# Patient Record
Sex: Female | Born: 1935 | Race: White | Hispanic: No | Smoking: Former smoker
Health system: Southern US, Community
[De-identification: ages and names within clinical notes are randomized; demographics above are authoritative.]

## PROBLEM LIST (undated history)

## (undated) DIAGNOSIS — E871 Hypo-osmolality and hyponatremia: Secondary | ICD-10-CM

## (undated) DIAGNOSIS — N183 Chronic kidney disease, stage 3 unspecified: Secondary | ICD-10-CM

## (undated) DIAGNOSIS — E785 Hyperlipidemia, unspecified: Secondary | ICD-10-CM

## (undated) DIAGNOSIS — I701 Atherosclerosis of renal artery: Secondary | ICD-10-CM

## (undated) DIAGNOSIS — I714 Abdominal aortic aneurysm, without rupture, unspecified: Secondary | ICD-10-CM

## (undated) DIAGNOSIS — H9190 Unspecified hearing loss, unspecified ear: Secondary | ICD-10-CM

## (undated) DIAGNOSIS — I10 Essential (primary) hypertension: Secondary | ICD-10-CM

## (undated) DIAGNOSIS — I739 Peripheral vascular disease, unspecified: Secondary | ICD-10-CM

## (undated) HISTORY — DX: Abdominal aortic aneurysm, without rupture, unspecified: I71.40

## (undated) HISTORY — DX: Chronic kidney disease, stage 3 (moderate): N18.3

## (undated) HISTORY — DX: Essential (primary) hypertension: I10

## (undated) HISTORY — DX: Hyperlipidemia, unspecified: E78.5

## (undated) HISTORY — DX: Peripheral vascular disease, unspecified: I73.9

## (undated) HISTORY — DX: Chronic kidney disease, stage 3 unspecified: N18.30

## (undated) HISTORY — DX: Abdominal aortic aneurysm, without rupture: I71.4

## (undated) HISTORY — DX: Atherosclerosis of renal artery: I70.1

---

## 2000-10-26 ENCOUNTER — Ambulatory Visit (HOSPITAL_COMMUNITY): Admission: RE | Admit: 2000-10-26 | Discharge: 2000-10-26 | Payer: Self-pay | Admitting: Internal Medicine

## 2000-10-26 ENCOUNTER — Emergency Department (HOSPITAL_COMMUNITY): Admission: EM | Admit: 2000-10-26 | Discharge: 2000-10-26 | Payer: Self-pay | Admitting: Emergency Medicine

## 2002-10-16 ENCOUNTER — Ambulatory Visit (HOSPITAL_COMMUNITY): Admission: RE | Admit: 2002-10-16 | Discharge: 2002-10-16 | Payer: Self-pay | Admitting: Internal Medicine

## 2005-08-30 ENCOUNTER — Emergency Department (HOSPITAL_COMMUNITY): Admission: EM | Admit: 2005-08-30 | Discharge: 2005-08-30 | Payer: Self-pay | Admitting: Emergency Medicine

## 2005-09-02 ENCOUNTER — Emergency Department (HOSPITAL_COMMUNITY): Admission: EM | Admit: 2005-09-02 | Discharge: 2005-09-02 | Payer: Self-pay | Admitting: Family Medicine

## 2005-09-09 ENCOUNTER — Emergency Department (HOSPITAL_COMMUNITY): Admission: EM | Admit: 2005-09-09 | Discharge: 2005-09-09 | Payer: Self-pay | Admitting: Family Medicine

## 2005-12-23 ENCOUNTER — Emergency Department (HOSPITAL_COMMUNITY): Admission: EM | Admit: 2005-12-23 | Discharge: 2005-12-23 | Payer: Self-pay | Admitting: Family Medicine

## 2006-01-27 ENCOUNTER — Emergency Department (HOSPITAL_COMMUNITY): Admission: EM | Admit: 2006-01-27 | Discharge: 2006-01-27 | Payer: Self-pay | Admitting: Family Medicine

## 2006-02-23 ENCOUNTER — Encounter: Admission: RE | Admit: 2006-02-23 | Discharge: 2006-02-23 | Payer: Self-pay | Admitting: Internal Medicine

## 2006-03-29 ENCOUNTER — Encounter: Admission: RE | Admit: 2006-03-29 | Discharge: 2006-03-29 | Payer: Self-pay | Admitting: Internal Medicine

## 2006-06-24 ENCOUNTER — Emergency Department (HOSPITAL_COMMUNITY): Admission: EM | Admit: 2006-06-24 | Discharge: 2006-06-24 | Payer: Self-pay | Admitting: Family Medicine

## 2007-11-14 HISTORY — PX: TRANSTHORACIC ECHOCARDIOGRAM: SHX275

## 2008-02-18 ENCOUNTER — Emergency Department (HOSPITAL_COMMUNITY): Admission: EM | Admit: 2008-02-18 | Discharge: 2008-02-18 | Payer: Self-pay | Admitting: Emergency Medicine

## 2008-06-25 HISTORY — PX: NM MYOVIEW LTD: HXRAD82

## 2009-05-24 ENCOUNTER — Emergency Department (HOSPITAL_COMMUNITY): Admission: EM | Admit: 2009-05-24 | Discharge: 2009-05-24 | Payer: Self-pay | Admitting: Family Medicine

## 2009-07-12 ENCOUNTER — Inpatient Hospital Stay (HOSPITAL_COMMUNITY): Admission: EM | Admit: 2009-07-12 | Discharge: 2009-07-14 | Payer: Self-pay | Admitting: Cardiology

## 2009-07-13 HISTORY — PX: OTHER SURGICAL HISTORY: SHX169

## 2010-11-02 LAB — BASIC METABOLIC PANEL
CO2: 23 mEq/L (ref 19–32)
Calcium: 9.1 mg/dL (ref 8.4–10.5)
Chloride: 108 mEq/L (ref 96–112)
GFR calc Af Amer: 34 mL/min — ABNORMAL LOW (ref >60)
GFR calc Af Amer: 36 mL/min — ABNORMAL LOW (ref >60)
GFR calc Af Amer: 38 mL/min — ABNORMAL LOW (ref >60)
GFR calc non Af Amer: 30 mL/min — ABNORMAL LOW (ref >60)
GFR calc non Af Amer: 31 mL/min — ABNORMAL LOW (ref >60)
Glucose, Bld: 103 mg/dL — ABNORMAL HIGH (ref 70–99)
Glucose, Bld: 92 mg/dL (ref 70–99)
Potassium: 4.4 mEq/L (ref 3.5–5.1)
Potassium: 4.5 mEq/L (ref 3.5–5.1)
Potassium: 5 mEq/L (ref 3.5–5.1)
Sodium: 138 mEq/L (ref 135–145)
Sodium: 139 mEq/L (ref 135–145)
Sodium: 141 mEq/L (ref 135–145)

## 2010-11-02 LAB — CBC
HCT: 33.7 % — ABNORMAL LOW (ref 36.0–46.0)
Hemoglobin: 11.3 g/dL — ABNORMAL LOW (ref 12.0–15.0)
MCHC: 33.5 g/dL (ref 30.0–36.0)
MCHC: 33.6 g/dL (ref 30.0–36.0)
MCV: 96.3 fL (ref 78.0–100.0)
Platelets: 452 10*3/uL — ABNORMAL HIGH (ref 150–400)
RDW: 14 % (ref 11.5–15.5)
WBC: 8.7 10*3/uL (ref 4.0–10.5)

## 2010-11-02 LAB — APTT: aPTT: 29 seconds (ref 24–37)

## 2010-11-02 LAB — TSH: TSH: 0.773 u[IU]/mL (ref 0.350–4.500)

## 2010-11-02 LAB — PROTIME-INR: INR: 0.96 (ref 0.00–1.49)

## 2010-12-14 NOTE — Procedures (Signed)
NAMECHANTELLA, [name]              ACCOUNT NO.:  [id number]   MEDICAL RECORD NO.:  [id number]          PATIENT TYPE:  INP   LOCATION:  2006                         FACILITY:  [facility]   PHYSICIAN:  Nanetta Batty, M.D.   DATE OF BIRTH:  08-29-1925   DATE OF PROCEDURE:  DATE OF DISCHARGE:                    PERIPHERAL VASCULAR INVASIVE PROCEDURE   Kristina [name] is an 77 year old frail-appearing Caucasian female with a  history of hypertension, moderate renal dysfunction with an atrophic  nonfunctioning left kidney and a creatinine in the 1.6-1.7 range and  claudication.  She has by duplex ultrasound, short segment occlusion of  her right SFA with significant disease at the origin of her left common  iliac.  She was admitted 24 hours ago for hydration and withholding her  ACE inhibitor and diuretic.  She presents now for abdominal aortography  with runoff using CO2 and limited contrast.   PROCEDURE DESCRIPTION:  The patient was brought to the second floor  [facility] in a postabsorptive state.  She was  premedicated with p.o. Valium.  She received Mucomyst.  Both groins were  prepped and shaved in the usual sterile fashion.  Xylocaine 1% was used  for local anesthesia.  A 6-French sheath was inserted into the left  femoral artery using the standard Seldinger technique.  A 6-French  pigtail catheter was used for abdominal aortography using CO2.  Distal  abdominal aortography was performed using contrast.  The contralateral  access was obtained with a crossover catheter, a 0.35 Wholey wire and  end-hole catheter.  A right lower extremity runoff was performed using  digital subtraction bolus-chase step-table technique.  A total of 50 or  55 mL of contrast was used for the entirety of the case.  Pullback  gradients were performed across the right external iliac and common  iliac junction, as well as the origin of the left common iliac artery  after administration of 200 mcg  of intraarterial nitroglycerin.  Visipaque dye was used for the entirety of the case, except for CO2  initially.  Retrograde aortic pressure was monitored during the case.   ANGIOGRAPHIC RESULTS:  1. Abdominal aorta:      a.     Renal arteries- patent right renal artery by CO2       angiography.      b.     Infrarenal abdominal aorta: Saccular calcified infrarenal       abdominal aorta which was aneurysmal and measured 2.7 cm by duplex       ultrasound.  2. Left lower extremity:      a.     Left common iliac arteries- 50% to 60% ostial/proximal left       common iliac artery stenosis.  There was some fluoroscopic       calcification.  There was approximately a 25-30 mm gradient noted       with pullback using a 5-French catheter and after administration       of 200 mcg of intraarterial nitroglycerin.      b.     The proximal segment of the left SFA was visualized  revealing high-grade segmental SFA disease.  3. Right lower extremity:      a.     A 50% to 60% right external iliac artery stenosis at its       origin with a 20 mm pullback gradient.      b.     A 70% to 80% diffuse segmental proximal mid right SFA       disease which was moderately calcified.  There was a short segment       occlusion just above Hunter's canal through constitution in the       above-the-knee popliteal and two-vessel runoff.   IMPRESSION:  Kristina [name] has diffuse superficial femoral artery disease  with right external iliac and left common iliac artery disease.  Given  her renal insufficiency and decreased creatinine clearance, and given  the fact that she had 50 mL of contrast used for the diagnostics of the  case, we will plan on doing staged intervention at some time in the near  future.   The sheath was removed.  Manual pressure was held to the groin to  achieve hemostasis.  The patient left the lab in stable condition.  She  will be gently hydrated overnight and her renal function will be   assessed in the morning.  She will be discharged home once she remains  clinically stable for planned staged intervention.      Nanetta Batty, M.D.  Electronically Signed     JB/MEDQ  D:  07/13/2009  T:  07/13/2009  Job:  [id number]   cc:   [facility]  [facility]  Ritta Slot, MD  Ace Gins, MD

## 2011-05-30 HISTORY — PX: OTHER SURGICAL HISTORY: SHX169

## 2011-09-01 ENCOUNTER — Other Ambulatory Visit: Payer: Self-pay | Admitting: Gastroenterology

## 2011-09-01 DIAGNOSIS — R109 Unspecified abdominal pain: Secondary | ICD-10-CM

## 2011-09-02 ENCOUNTER — Other Ambulatory Visit: Payer: Self-pay

## 2012-10-04 ENCOUNTER — Other Ambulatory Visit (HOSPITAL_COMMUNITY): Payer: Self-pay | Admitting: Cardiology

## 2012-10-04 DIAGNOSIS — I714 Abdominal aortic aneurysm, without rupture: Secondary | ICD-10-CM

## 2012-10-29 ENCOUNTER — Encounter (HOSPITAL_COMMUNITY): Payer: Self-pay

## 2013-07-10 ENCOUNTER — Encounter: Payer: Self-pay | Admitting: Cardiology

## 2013-07-11 ENCOUNTER — Encounter: Payer: Self-pay | Admitting: Cardiology

## 2013-07-11 ENCOUNTER — Telehealth: Payer: Self-pay | Admitting: Cardiology

## 2013-07-11 ENCOUNTER — Ambulatory Visit (INDEPENDENT_AMBULATORY_CARE_PROVIDER_SITE_OTHER): Payer: Medicare Other | Admitting: Cardiology

## 2013-07-11 VITALS — BP 174/94 | HR 80 | Ht 59.0 in | Wt 97.3 lb

## 2013-07-11 DIAGNOSIS — I70219 Atherosclerosis of native arteries of extremities with intermittent claudication, unspecified extremity: Secondary | ICD-10-CM

## 2013-07-11 DIAGNOSIS — E785 Hyperlipidemia, unspecified: Secondary | ICD-10-CM

## 2013-07-11 DIAGNOSIS — I10 Essential (primary) hypertension: Secondary | ICD-10-CM

## 2013-07-11 DIAGNOSIS — I714 Abdominal aortic aneurysm, without rupture, unspecified: Secondary | ICD-10-CM | POA: Insufficient documentation

## 2013-07-11 DIAGNOSIS — N183 Chronic kidney disease, stage 3 (moderate): Secondary | ICD-10-CM

## 2013-07-11 MED ORDER — HYDRALAZINE HCL 50 MG PO TABS: 50.0000 mg | ORAL_TABLET | Freq: Three times a day (TID) | ORAL | Status: DC

## 2013-07-11 MED ORDER — HYDRALAZINE HCL 50 MG PO TABS: 50.0000 mg | ORAL_TABLET | Freq: Two times a day (BID) | ORAL | Status: DC

## 2013-07-11 MED ORDER — SIMVASTATIN 10 MG PO TABS: 10.0000 mg | ORAL_TABLET | Freq: Every day | ORAL | Status: DC

## 2013-07-11 NOTE — Patient Instructions (Signed)
Increase hydralazine to 50 mg. Take one tablet twice a day  Restart simvastatin 10 mg one tablet at bedtime.  Your physician wants you to follow-up in 6 month Dr Herbie Baltimore.  You will receive a reminder letter in the mail two months in advance. If you don't receive a letter, please call our office to schedule the follow-up appointment.

## 2013-07-12 NOTE — Telephone Encounter (Signed)
Spoke to  [employer] about patient medication simavastatin 10 mg qhs  Place on hold  Hydralazine 50 mg twice a day.  (patient has 25 mg tablet at present time)

## 2013-07-14 ENCOUNTER — Encounter: Payer: Self-pay | Admitting: Cardiology

## 2013-07-14 NOTE — Assessment & Plan Note (Signed)
Her chronic kidney disease is one potential reason to try to avoid invasive procedures if at all possible. She has voiced an opinion if she would at this particular time not be interested in dialysis if it came to it.

## 2013-07-14 NOTE — Assessment & Plan Note (Signed)
Very poorly controlled today. I will increase her hydralazine from 25 to 50 mg twice a day. Dr. Hyman Hopes, her nephrologist to stop the ACE inhibitor and HCTZ. I'll defer further adjustments of her antihypertensive regimen to her nephrologist.

## 2013-07-14 NOTE — Progress Notes (Signed)
PATIENT: Kristina [name] MRN: [id number]  DOB: 1926/05/06   DOV:07/14/2013 PCP: Thayer Headings, MD  Clinic Note: Chief Complaint  Patient presents with  . Annual Exam    chest pain , no sob , edema legs, no dopllers this year, Dr Hyman Hopes d/c linsinopril-hct   HPI: Kristina [name] is a 79 y.o.  female with a PMH below who presents today for annual followup of her mostly PAD. She has multiple cardiac risk factors but has had a relatively normal cardiac evaluation to date with a Myoview in 2009 was negative a relatively normal echocardiogram as well that time. She has not had any anginal symptoms or heart failure symptoms to suspect any coronary disease. She does have relatively significant lower extremity arterial disease with claudication. She had peripheral angiography performed by Dr. Allyson Sabal in 2009, but no intervention was done at that time. She has not wanted to go back under the care of Dr. Allyson Sabal unfortunately since.  Interval History: She presents as a relatively at her baseline level. She still gets claudication on walking around in his mouth. Basically in a driveway and she is claudication in her calves. She also gets at nighttime for leg aching and it actually gets better when she gets up and moves around  a bit.  She continues to deny any cardiac symptoms such as angina type chest pain or dyspnea at rest or exertion. No PND, orthopnea or edema. No palpitations or rapid heartbeat/irregular heartbeats. No lightheadedness, dizziness, wooziness or syncope/near-syncope. No TIA or amaurosis fugax symptoms. No melena, hematochezia or hematuria. No nosebleeds.  Past Medical History  Diagnosis Date  . PAD (peripheral artery disease)     LEA Dopplers: Right external iliac 40%, right mid SFA 70-99% with occlusion in Hunter's canal. Angiography. LEFT common iliac 60%, left SFA 70-9%, popliteal 50%; RIGHT posterior tibial occluded;; carotid Dopplers in 2009: Less than 50% stenosis.  . Hypertension   .  Renal artery stenosis   . Chronic kidney disease (CKD), stage III (moderate)     1 functional kidney due to renal artery occlusion on the right  . AAA (abdominal aortic aneurysm)     Mild - measuring 2.8x2.8cm  . Hyperlipidemia     Well-controlled    Prior Cardiac Evaluation and Past Surgical History: Past Surgical History  Procedure Laterality Date  . Peripheral vascular angiogram  07/13/2009    No intervention - "plan on doing staged intervention in near future"  . Lower extremity arterial doppler  05/30/2011    Right EIA->50% daimeter reduction, Rt SFA 70-99% diameter reduction, Rt SFA-occlusive disease at Hunter's canal, Rt PTA-appeared occluded, Lft CIA-moderate amount of calcific plaque w/ >60% diameter reduction, Lft prox SFA 70-99% diameter reduction, Lft POP-large amount of irregular mixed suggesting <50% diameter reduction  . Nm myoview ltd  06/25/2008    No scintigraphic evidence of inducible myocardial ischemia  . Transthoracic echocardiogram  11/14/2007    EF >55%, mild mitral and tricuspid regurg.    Allergies  Allergen Reactions  . Lipitor [Atorvastatin]   . Pletal [Cilostazol]     Current Outpatient Prescriptions  Medication Sig Dispense Refill  . aspirin 325 MG tablet Take 325 mg by mouth daily.      Marland Kitchen atorvastatin (LIPITOR) 20 MG tablet Take 20 mg by mouth daily.      . Cholecalciferol (VITAMIN D-3 PO) Take by mouth.      Marland Kitchen HYDROcodone-acetaminophen (NORCO/VICODIN) 5-325 MG per tablet Take 1 tablet by mouth every 6 (six) hours as  needed for moderate pain.      . Magnesium Cl-Calcium Carbonate (SLOW-MAG PO) Take by mouth.      . Polyethylene Glycol 3350 (MIRALAX PO) Take by mouth.      . Probiotic Product (ALIGN) 4 MG CAPS Take by mouth daily.      . hydrALAZINE (APRESOLINE) 50 MG tablet Take 1 tablet (50 mg total) by mouth 2 (two) times daily.  60 tablet  11  . simvastatin (ZOCOR) 10 MG tablet Take 1 tablet (10 mg total) by mouth at bedtime.  30 tablet  11    No current facility-administered medications for this visit.    History   Social History Narrative   Single mother of 2, grandmother 1. Tries to exercise when she can, but is limited by claudication.   Former smoker who quit in the early 2000s.   Drinks occasional glass of wine    ROS: A comprehensive Review of Systems - Negative if not noted in history of present illness.  PHYSICAL EXAM BP 174/94  Pulse 80  Ht 4\' 11"  (1.499 m)  Wt 97 lb 4.8 oz (44.135 kg)  BMI 19.64 kg/m2 General: She is a very pleasant, healthy appearing elderly woman. Appears younger than her stated age. She is in no acute distress, alert and oriented x3, answers questions appropriately. She is well groomed. She does have the appearance of a chronic smoker with the tanned, leathery skin and hoarse voice. She has a normal gait and walks without difficulty.  HEENT: NCAT, EOMI, MMM. Anicteric sclerae. Neck: Supple, no LAN or JVD. Soft right carotid bruit  Heart: RRR, normal S1, S2. Soft HSM heard at the apex, 1/6. Nondisplaced PMI. No other M./R./G.,  Lungs: CTAB with mild interstitial sounds bilaterally, but otherwise CTAB and nonlabored, no wheezes, rales or rhonchi.  Abdomen: Soft/NT/ND/NABS. No HSM. She does have a pulsatile "abdominal mass", which is probably the SMA, I do not think it is big enough to be the abdominal aneurysm. There is a soft bruit there.  Bilateral femoral bruits heard.  Extremities: No C/C/E, diminished bilateral pulses, maybe 1+ bilaterally, no edema. The skin is warm and dry to touch. Mild peripheral vascular changes but no venous stasis changes.  Normal gait, normal strength.  UEA:VWUJWJXBJ today: Yes Rate:80 , Rhythm: SR PVC, nonspecific ST-T abnormalities  Recent Labs: None   ASSESSMENT / PLAN: Atherosclerotic PVD with intermittent claudication - near occlusive SFA disease with moderate iliac disease She does continue to have relatively limiting claudication. She is, however,  adamant about not being referred back to Dr. Allyson Sabal which is unfortunate. If she has critical limb ischemia type symptoms I would then consider performing peripheral angiography myself been referred to Dr. Kirke Corin, from our  office. At this point in time I think she is fine with medical therapy. She walks as well as she can, and would prefer not to have invasive procedures.  The mainstay of therapy or his medical management of hypertension and lipids along with continued ambulation. We'll likely order a followup Dopplers at her next visit.  AAA (abdominal aortic aneurysm) Not overly enlarged, would likely followup at time of her next arterial Dopplers.  Chronic kidney disease (CKD), stage III (moderate) Her chronic kidney disease is one potential reason to try to avoid invasive procedures if at all possible. She has voiced an opinion if she would at this particular time not be interested in dialysis if it came to it.  Hypertension Very poorly controlled today. I will increase her  hydralazine from 25 to 50 mg twice a day. Dr. Hyman Hopes, her nephrologist to stop the ACE inhibitor and HCTZ. I'll defer further adjustments of her antihypertensive regimen to her nephrologist.  Hyperlipidemia She stopped taking Lipitor herself because it was causing him muscle aches and pains. She therefore has not been on statin for a little while. She did fine with simvastatin in the past and asked if she can try to go back on that if need be. I finally starting simvastatin 10 mg daily. It is because initially, our next option would be Livalo or Crestor. She says in the past the patient is taking Crestor and had problems with it.    Orders Placed This Encounter  Procedures  . EKG 12-Lead   Followup: 6 months  DAVID W. Herbie Baltimore, M.D., M.S. [facility] [address] [place], [place]  [address]  [phone] Pager # [phone]

## 2013-07-14 NOTE — Assessment & Plan Note (Signed)
Not overly enlarged, would likely followup at time of her next arterial Dopplers.

## 2013-07-14 NOTE — Assessment & Plan Note (Signed)
She stopped taking Lipitor herself because it was causing him muscle aches and pains. She therefore has not been on statin for a little while. She did fine with simvastatin in the past and asked if she can try to go back on that if need be. I finally starting simvastatin 10 mg daily. It is because initially, our next option would be Livalo or Crestor. She says in the past the patient is taking Crestor and had problems with it.

## 2013-07-14 NOTE — Assessment & Plan Note (Addendum)
She does continue to have relatively limiting claudication. She is, however, adamant about not being referred back to Dr. Allyson Sabal which is unfortunate. If she has critical limb ischemia type symptoms I would then consider performing peripheral angiography myself been referred to Dr. Kirke Corin, from our [place] office. At this point in time I think she is fine with medical therapy. She walks as well as she can, and would prefer not to have invasive procedures.  The mainstay of therapy or his medical management of hypertension and lipids along with continued ambulation. We'll likely order a followup Dopplers at her next visit.

## 2014-03-17 ENCOUNTER — Ambulatory Visit (INDEPENDENT_AMBULATORY_CARE_PROVIDER_SITE_OTHER): Payer: Medicare Other | Admitting: Cardiology

## 2014-03-17 ENCOUNTER — Encounter: Payer: Self-pay | Admitting: Cardiology

## 2014-03-17 VITALS — BP 160/80 | HR 69 | Ht 60.0 in | Wt 94.1 lb

## 2014-03-17 DIAGNOSIS — I10 Essential (primary) hypertension: Secondary | ICD-10-CM

## 2014-03-17 DIAGNOSIS — I70219 Atherosclerosis of native arteries of extremities with intermittent claudication, unspecified extremity: Secondary | ICD-10-CM

## 2014-03-17 DIAGNOSIS — I714 Abdominal aortic aneurysm, without rupture, unspecified: Secondary | ICD-10-CM

## 2014-03-17 NOTE — Progress Notes (Signed)
Pt came as scheduled, but due to DOD delay, was unable to be seen prior to her pending 2nd MD appt.  She requested rescheduling appt.   Did have EKG performed.   Leonie Man, MD

## 2014-06-09 ENCOUNTER — Ambulatory Visit (INDEPENDENT_AMBULATORY_CARE_PROVIDER_SITE_OTHER): Payer: Medicare Other | Admitting: Cardiology

## 2014-06-09 ENCOUNTER — Encounter: Payer: Self-pay | Admitting: Cardiology

## 2014-06-09 VITALS — BP 122/68 | HR 92 | Ht 59.0 in | Wt 93.7 lb

## 2014-06-09 DIAGNOSIS — I739 Peripheral vascular disease, unspecified: Secondary | ICD-10-CM

## 2014-06-09 DIAGNOSIS — I70219 Atherosclerosis of native arteries of extremities with intermittent claudication, unspecified extremity: Secondary | ICD-10-CM

## 2014-06-09 DIAGNOSIS — N183 Chronic kidney disease, stage 3 unspecified: Secondary | ICD-10-CM

## 2014-06-09 DIAGNOSIS — I714 Abdominal aortic aneurysm, without rupture, unspecified: Secondary | ICD-10-CM

## 2014-06-09 DIAGNOSIS — E785 Hyperlipidemia, unspecified: Secondary | ICD-10-CM

## 2014-06-09 DIAGNOSIS — I10 Essential (primary) hypertension: Secondary | ICD-10-CM

## 2014-06-09 NOTE — Assessment & Plan Note (Signed)
Much better controlled today. I increased her hydralazine during the last visit. She does have some [facility] her blood pressure ranges in the 160/80 range, but for the most part is usually pretty well-controlled. She is not overly symptomatic. I would not be too overly aggressive.

## 2014-06-09 NOTE — Assessment & Plan Note (Signed)
Reluctant to do any invasive procedures with contrast. If her symptoms of claudication get worse, we would need to consider potentially this evaluation, I would refer to Dr. Annia Belt for invasive evaluation if indicated.

## 2014-06-09 NOTE — Assessment & Plan Note (Signed)
Apparently the last time I saw her she had stopped her Lipitor. She is now on Zocor at 20 mg per she seems to be doing relatively well with this dose. Unfortunately I don't have any recent labs on her. She seems to be tolerating it well. Her PCP is following her labs.Kristina [name]

## 2014-06-09 NOTE — Assessment & Plan Note (Signed)
Stable claudication. Again she continues to be on inclined to pursue any invasive therapy unless she gets worse off. She is still concerned about her solitary kidney and Baseline renal insufficiency. For now we'll continue with cardiovascular risk modification using a statin and blood pressure control. He is on aspirin, and did not tolerate Pletal

## 2014-06-09 NOTE — Progress Notes (Signed)
PCP: [name], MD  Clinic Note: Chief Complaint  Patient presents with  . Follow-up    3 MONTHS:  No chest pain, SOB, edema or dizziness.  Leg and hip  pain with activity and occas at night.    HPI: Kristina [name] is a 80 y.o. female with a PMH below who presents today for would also be a one-year visit followup. She was supposed to see me in 6 months which would have been in May, however that did not occur until August. We had rescheduled because she had another commitment that made her unable to wait & there was a delay in the office. She basically follows up for chronic lower extremity arterial disease with claudication.  In the past she opted not to have intervention done back in 2009 at the time of peripheral angiography. She has not desire to have further testing unless symptoms get progressively worse.  Interval History: she presents today really doing fine. She is stable colonic that makes her have to stop work at times and course of walking 1 block. Is not really lifestyle limiting, in that she was able to spend the better part of the day last weekend walking around shopping mall with her nieces. She just stopped occasionally to look at lites rest. But didn't have to stop what she wanted to do. Otherwise her cardiac standpoint she is stable with no resting or exertional chest tightness or pressure or dyspnea. No PND, orthopnea or edema. No rapid irregular heartbeat/palpitations or arrhythmias. No lightheadedness, dizziness with exception of occasionally when she first stands. No syncope/near syncope or TIA/amaurosis fugax.  Past Medical History  Diagnosis Date  . PAD (peripheral artery disease)     LEA Dopplers: Right external iliac 40%, right mid SFA 70-99% with occlusion in Hunter's canal. Angiography. LEFT common iliac 60%, left SFA 70-9%, popliteal 50%; RIGHT posterior tibial occluded;; carotid Dopplers in 2009: Less than 50% stenosis.  . Hypertension   . Renal artery  stenosis   . Chronic kidney disease (CKD), stage III (moderate)     1 functional kidney due to renal artery occlusion on the right  . AAA (abdominal aortic aneurysm)     Mild - measuring 2.8x2.8cm  . Hyperlipidemia     Well-controlled    Prior Cardiac Evaluation and Past Surgical History: Past Surgical History  Procedure Laterality Date  . Peripheral vascular angiogram  07/13/2009    No intervention - "plan on doing staged intervention in near future"  . Lower extremity arterial doppler  05/30/2011    Right EIA->50% daimeter reduction, Rt SFA 70-99% diameter reduction, Rt SFA-occlusive disease at Hunter's canal, Rt PTA-appeared occluded, Lft CIA-moderate amount of calcific plaque w/ >60% diameter reduction, Lft prox SFA 70-99% diameter reduction, Lft POP-large amount of irregular mixed suggesting <50% diameter reduction  . Nm myoview ltd  06/25/2008    No scintigraphic evidence of inducible myocardial ischemia  . Transthoracic echocardiogram  11/14/2007    EF >55%, mild mitral and tricuspid regurg.   ROS: A comprehensive was performed. Review of Systems  Constitutional: Negative for fever, chills and malaise/fatigue.  HENT: Negative for congestion and nosebleeds.   Respiratory: Negative for cough, shortness of breath and wheezing.   Gastrointestinal: Negative for blood in stool and melena.  Genitourinary: Negative for hematuria.  Musculoskeletal: Positive for back pain and joint pain. Negative for myalgias.  Neurological: Positive for dizziness. Negative for speech change, focal weakness, seizures, loss of consciousness, weakness and headaches.  As noted in history of present illness  Endo/Heme/Allergies: Negative.   All other systems reviewed and are negative.   Current Outpatient Prescriptions on File Prior to Visit  Medication Sig Dispense Refill  . aspirin 325 MG tablet Take 325 mg by mouth daily.    . Cholecalciferol (VITAMIN D-3 PO) Take by mouth once a week.     .  hydrALAZINE (APRESOLINE) 50 MG tablet Take 1 tablet (50 mg total) by mouth 2 (two) times daily. 60 tablet 11  . HYDROcodone-acetaminophen (NORCO/VICODIN) 5-325 MG per tablet Take 1 tablet by mouth every 6 (six) hours as needed for moderate pain.    . Polyethylene Glycol 3350 (MIRALAX PO) Take by mouth as needed.     . Probiotic Product (ALIGN) 4 MG CAPS Take by mouth daily.    . simvastatin (ZOCOR) 10 MG tablet Take 1 tablet (10 mg total) by mouth at bedtime. 30 tablet 11   No current facility-administered medications on file prior to visit.    ALLERGIES REVIEWED IN EPIC -- No change SOCIAL AND FAMILY HISTORY REVIEWED IN EPIC -- nO change  Wt Readings from Last 3 Encounters:  06/09/14 93 lb 11.2 oz (42.502 kg)  03/17/14 94 lb 1.6 oz (42.683 kg)  07/11/13 97 lb 4.8 oz (44.135 kg)    PHYSICAL EXAM BP 122/68 mmHg  Pulse 92  Ht 4\' 11"  (1.499 m)  Wt 93 lb 11.2 oz (42.502 kg)  BMI 18.91 kg/m2 General: very pleasant, healthy appearing elderly woman. Appears younger than her stated age. She is in no acute distress, alert and oriented x3, answers questions appropriately. She is well groomed. Thin - significant thoracic kyphosis. HEENT: NCAT, EOMI, MMM. Anicteric sclerae. Neck: Supple, no LAN or JVD. Soft right carotid bruit  Heart: RRR, normal S1, S2. Soft HSM heard at the apex, 1/6. Nondisplaced PMI. No other M./R./G., Lungs: CTAB with mild interstitial sounds bilaterally, but otherwise CTAB and nonlabored, no wheezes, rales or rhonchi.  Abdomen: Soft/NT/ND/NABS. No HSM.  Extremities: No C/C/E, diminished bilateral pulses, maybe 1+ bilaterally, no edema. + Bilateral femoral bruit.   Adult ECG Report - Not done From 8/17:   Rate: 69 ;  Rhythm: normal sinus rhythm - ~ LVH with repolarization changes.  Narrative Interpretation: Otherwise normal & stable EKG  Recent Labs:  None available   ASSESSMENT / PLAN: Atherosclerotic PVD with intermittent claudication - near occlusive SFA disease  with moderate iliac disease Stable claudication. Again she continues to be on inclined to pursue any invasive therapy unless she gets worse off. She is still concerned about her solitary kidney and Baseline renal insufficiency. For now we'll continue with cardiovascular risk modification using a statin and blood pressure control. He is on aspirin, and did not tolerate Pletal  Essential hypertension Much better controlled today. I increased her hydralazine during the last visit. She does have some [facility] her blood pressure ranges in the 160/80 range, but for the most part is usually pretty well-controlled. She is not overly symptomatic. I would not be too overly aggressive.  Hyperlipidemia with target LDL less than 100 Apparently the last time I saw her she had stopped her Lipitor. She is now on Zocor at 20 mg per she seems to be doing relatively well with this dose. Unfortunately I don't have any recent labs on her. She seems to be tolerating it well. Her PCP is following her labs.Marland Kitchen  AAA (abdominal aortic aneurysm) Relatively small dilation of the distal aorta with also evidence of possible renal  artery stenosis. We'll repeat ortic and renal arterial Dopplers.  Chronic kidney disease (CKD), stage III (moderate) Reluctant to do any invasive procedures with contrast. If her symptoms of claudication get worse, we would need to consider potentially this evaluation, I would refer to Dr. Annia Belt for invasive evaluation if indicated.    Orders Placed This Encounter  Procedures  . Renal Artery Duplex    Standing Status: Future     Number of Occurrences:      Standing Expiration Date: 06/09/2015    Order Specific Question:  Where should this test be performed:    Answer:  [facility]  . Abdominal Aortic Aneurysm duplex    Standing Status: Future     Number of Occurrences:      Standing Expiration Date: 06/09/2015    Order Specific Question:  Where should this test be performed:     Answer:  [facility]   No orders of the defined types were placed in this encounter.    Followup: 1 yr   Leonie Man, M.D., M.S. Interventional Cardiologist   Pager # [phone]

## 2014-06-09 NOTE — Patient Instructions (Signed)
Your physician has requested that you have an abdominal aorta duplex. During this test, an ultrasound is used to evaluate the aorta. Allow 30 minutes for this exam. Do not eat after midnight the day before and avoid carbonated beverages  Your physician has requested that you have a renal artery duplex. During this test, an ultrasound is used to evaluate blood flow to the kidneys. Allow one hour for this exam. Do not eat after midnight the day before and avoid carbonated beverages. Take your medications as you usually do.  Will call you with results.  Your physician wants you to follow-up in 12 months Dr Ellyn Hack.  You will receive a reminder letter in the mail two months in advance. If you don't receive a letter, please call our office to schedule the follow-up appointment.

## 2014-06-09 NOTE — Assessment & Plan Note (Signed)
Relatively small dilation of the distal aorta with also evidence of possible renal artery stenosis. We'll repeat ortic and renal arterial Dopplers.

## 2014-07-02 ENCOUNTER — Encounter (HOSPITAL_COMMUNITY): Payer: Medicare Other

## 2014-07-02 ENCOUNTER — Ambulatory Visit (HOSPITAL_COMMUNITY)
Admission: RE | Admit: 2014-07-02 | Discharge: 2014-07-02 | Disposition: A | Discharge status: Home / Self Care | Payer: Medicare Other | Source: Ambulatory Visit | Attending: Cardiovascular Disease | Admitting: Cardiovascular Disease

## 2014-07-02 DIAGNOSIS — N183 Chronic kidney disease, stage 3 unspecified: Secondary | ICD-10-CM

## 2014-07-02 DIAGNOSIS — I701 Atherosclerosis of renal artery: Secondary | ICD-10-CM | POA: Diagnosis not present

## 2014-07-02 DIAGNOSIS — I714 Abdominal aortic aneurysm, without rupture, unspecified: Secondary | ICD-10-CM

## 2014-07-02 NOTE — Progress Notes (Signed)
Renal Duplex Completed. [name], BS, RDMS, RVT  

## 2014-07-10 ENCOUNTER — Telehealth: Payer: Self-pay | Admitting: *Deleted

## 2014-07-10 NOTE — Telephone Encounter (Signed)
-----   Message from Leonie Man, MD sent at 07/07/2014  6:43 PM EST ----- Stable renal artery Doppler; findings less than 60% stenosis. Will follow-up every couple years  HARDING,DAVID W, MD

## 2014-07-10 NOTE — Telephone Encounter (Signed)
Spoke to patient. Result given . Verbalized understanding  

## 2014-08-07 ENCOUNTER — Other Ambulatory Visit: Payer: Self-pay | Admitting: Cardiology

## 2015-06-22 ENCOUNTER — Ambulatory Visit (INDEPENDENT_AMBULATORY_CARE_PROVIDER_SITE_OTHER): Payer: Medicare Other | Admitting: Cardiology

## 2015-06-22 VITALS — BP 130/68 | HR 80 | Ht 59.0 in | Wt 91.0 lb

## 2015-06-22 DIAGNOSIS — N183 Chronic kidney disease, stage 3 unspecified: Secondary | ICD-10-CM

## 2015-06-22 DIAGNOSIS — I739 Peripheral vascular disease, unspecified: Secondary | ICD-10-CM | POA: Diagnosis not present

## 2015-06-22 DIAGNOSIS — I714 Abdominal aortic aneurysm, without rupture, unspecified: Secondary | ICD-10-CM

## 2015-06-22 DIAGNOSIS — E785 Hyperlipidemia, unspecified: Secondary | ICD-10-CM

## 2015-06-22 DIAGNOSIS — I10 Essential (primary) hypertension: Secondary | ICD-10-CM

## 2015-06-22 DIAGNOSIS — I70219 Atherosclerosis of native arteries of extremities with intermittent claudication, unspecified extremity: Secondary | ICD-10-CM

## 2015-06-22 NOTE — Patient Instructions (Addendum)
Your physician wants you to follow-up in 12 months with Dr Ellyn Hack. You will receive a reminder letter in the mail two months in advance. If you don't receive a letter, please call our office to schedule the follow-up appointment.  NO CHANGE WITH CURRENT MEDICATIONS.   If you need a refill on your cardiac medications before your next appointment, please call your pharmacy.

## 2015-06-22 NOTE — Progress Notes (Signed)
PCP: [name], MD  Clinic Note: Chief Complaint  Patient presents with  . Annual Exam    pt states no chest pain no SOB no edema no light headedness or dizziness  . PAD    Claudication    HPI: Kristina [name] is a 81 y.o. female with a PMH below who presents today for annual f/u for difficult to control HTN, PAD & AAA, HLD.  TAMARIA KOLLIAS was last seen on Nov 9 , 2015 - stable.  Claudication Sx non-limiting up to one block.  Recent Hospitalizations: None  Studies Reviewed: None  Interval History: Still doing well.  Still trying to walk - can walk around the block stopping a few times.  Limited by claudication, but still keeps going. Does housework - vacuuming & dusting. Described as piercing pain - relieved @ rest.  Otherwise very happy, with no major concerns or complaints.  No chest pain or shortness of breath with rest or exertion. No PND, orthopnea or edema. No palpitations, lightheadedness, dizziness, weakness or syncope/near syncope. No TIA/amaurosis fugax symptoms. +claudication.  ROS: A comprehensive was performed. Review of Systems  HENT: Negative for nosebleeds.   Eyes: Negative for blurred vision.  Cardiovascular: Positive for claudication (As noted above).  Gastrointestinal: Negative for blood in stool.  Genitourinary: Negative for hematuria and flank pain.  Musculoskeletal: Positive for joint pain (knees and hips). Negative for falls.  Neurological: Negative for weakness and headaches.  Endo/Heme/Allergies: Does not bruise/bleed easily.  Psychiatric/Behavioral: Negative for depression.  All other systems reviewed and are negative.    Past Medical History  Diagnosis Date  . PAD (peripheral artery disease) (HCC)     LEA Dopplers: Right external iliac 40%, right mid SFA 70-99% with occlusion in Hunter's canal. Angiography. LEFT common iliac 60%, left SFA 70-9%, popliteal 50%; RIGHT posterior tibial occluded;; carotid Dopplers in 2009: Less than 50%  stenosis.  . Hypertension   . Renal artery stenosis ([place])   . Chronic kidney disease (CKD), stage III (moderate)     1 functional kidney due to renal artery occlusion on the right  . AAA (abdominal aortic aneurysm) (HCC)     Mild - measuring 2.8x2.8cm  . Hyperlipidemia     Well-controlled    Past Surgical History  Procedure Laterality Date  . Peripheral vascular angiogram  07/13/2009    No intervention - "plan on doing staged intervention in near future"  . Lower extremity arterial doppler  05/30/2011    Right EIA->50% daimeter reduction, Rt SFA 70-99% diameter reduction, Rt SFA-occlusive disease at Hunter's canal, Rt PTA-appeared occluded, Lft CIA-moderate amount of calcific plaque w/ >60% diameter reduction, Lft prox SFA 70-99% diameter reduction, Lft POP-large amount of irregular mixed suggesting <50% diameter reduction  . Nm myoview ltd  06/25/2008    No scintigraphic evidence of inducible myocardial ischemia  . Transthoracic echocardiogram  11/14/2007    EF >55%, mild mitral and tricuspid regurg.   Prior to Admission medications   Medication Sig Start Date End Date Taking? Authorizing Provider  aspirin 325 MG tablet Take 325 mg by mouth daily.    Historical Provider, MD  Cholecalciferol (VITAMIN D-3 PO) Take by mouth once a week.     Historical Provider, MD  hydrALAZINE (APRESOLINE) 50 MG tablet take 1 tablet by mouth twice a day 08/07/14   Leonie Man, MD  HYDROcodone-acetaminophen (NORCO/VICODIN) 5-325 MG per tablet Take 1 tablet by mouth every 6 (six) hours as needed for moderate pain.    Historical  Provider, MD  Polyethylene Glycol 3350 (MIRALAX PO) Take by mouth as needed.     Historical Provider, MD  Probiotic Product (ALIGN) 4 MG CAPS Take by mouth daily.    Historical Provider, MD  simvastatin (ZOCOR) 10 MG tablet Take 1 tablet (10 mg total) by mouth at bedtime. 07/11/13   Leonie Man, MD   Allergies  Allergen Reactions  . Lipitor [Atorvastatin]   . Pletal  [Cilostazol]     Social History   Social History  . Marital Status: Divorced    Spouse Name: N/A  . Number of Children: N/A  . Years of Education: N/A   Social History Main Topics  . Smoking status: Former Smoker    Types: Cigarettes    Quit date: 07/11/2001  . Smokeless tobacco: None  . Alcohol Use: Yes  . Drug Use: No  . Sexual Activity: Not Asked   Other Topics Concern  . None   Social History Narrative   Single mother of 2, grandmother 1. Tries to exercise when she can, but is limited by claudication.   Former smoker who quit in the early 2000s.   Drinks occasional glass of wine   Family History  Problem Relation Age of Onset  . Heart attack Brother   . Cancer Brother     Bone cancer    Wt Readings from Last 3 Encounters:  06/22/15 91 lb (41.277 kg)  06/09/14 93 lb 11.2 oz (42.502 kg)  03/17/14 94 lb 1.6 oz (42.683 kg)    PHYSICAL EXAM BP 130/68 mmHg  Pulse 80  Ht 4\' 11"  (1.499 m)  Wt 91 lb (41.277 kg)  BMI 18.37 kg/m2 General: very pleasant, healthy appearing elderly woman. Appears younger than her stated age. She is in no acute distress, alert and oriented x3, answers questions appropriately. She is well groomed. Thin - significant thoracic kyphosis. HEENT: NCAT, EOMI, MMM. Anicteric sclerae. Neck: Supple, no LAN or JVD. Soft right carotid bruit  Heart: RRR, normal S1, S2. SoftSEM @ base & HSM heard at the apex, 1/6. Nondisplaced PMI. No other M./R./G.,  Lungs: CTAB with mild interstitial sounds bilaterally, but otherwise CTAB and nonlabored, no wheezes, rales or rhonchi.  Abdomen: Soft/NT/ND/NABS. No HSM.  Extremities: No C/C/E, diminished bilateral pulses, maybe 1+ bilaterally, no edema. + Bilateral femoral bruit. Neuro: Grossly normal.   Adult ECG Report  Rate: 80 ;  Rhythm: normal sinus rhythm and With PVCs versus fusion complexes.  Left axis deviation (-34). Borderline LVH.;   Narrative Interpretation: Stable EKG   Other studies  Reviewed: Additional studies/ records that were reviewed today include:  Recent Labs: Monitored by PCP No results found for: CHOL, HDL, LDLCALC, LDLDIRECT, TRIG, CHOLHDL   ASSESSMENT / PLAN: Problem List Items Addressed This Visit    Hyperlipidemia with target LDL less than 100 - Primary (Chronic)    Now converted from Lipitor to Zocor 10 mg. Monitored by PCP. Do not have recent labs. During Zocor well      Relevant Orders   EKG 12-Lead (Completed)   Essential hypertension (Chronic)    Well-controlled on hydralazine      Relevant Orders   EKG 12-Lead (Completed)   Chronic kidney disease (CKD), stage III (moderate) (Chronic)   Relevant Orders   EKG 12-Lead (Completed)   Atherosclerotic PVD with intermittent claudication - near occlusive SFA disease with moderate iliac disease (Chronic)    She does have significant claudication, but no critical limb ischemia and no lifestyle limiting claudication. She clearly  has severe disease. Unfortunately she is very reluctant to undergo any angiography secondary to her single kidney.  Did not tolerate Pletal. Is on aspirin and statin and hydralazine. Did not do well with beta blockers/calcium channel blocker      Relevant Orders   EKG 12-Lead (Completed)   AAA (abdominal aortic aneurysm) ([place]) (Chronic)    She is reluctant to have any repeat studies done at this time. Was relatively small that time. I think maybe next year I will try to readdress.      Relevant Orders   EKG 12-Lead (Completed)      Current medicines are reviewed at length with the patient today. (+/- concerns) none The following changes have been made: None  Studies Ordered:   Orders Placed This Encounter  Procedures  . EKG 12-Lead      Leonie Man, M.D., M.S. Interventional Cardiologist   Pager # [phone]

## 2015-06-24 ENCOUNTER — Encounter: Payer: Self-pay | Admitting: Cardiology

## 2015-06-24 NOTE — Assessment & Plan Note (Signed)
She does have significant claudication, but no critical limb ischemia and no lifestyle limiting claudication. She clearly has severe disease. Unfortunately she is very reluctant to undergo any angiography secondary to her single kidney.  Did not tolerate Pletal. Is on aspirin and statin and hydralazine. Did not do well with beta blockers/calcium channel blocker

## 2015-06-24 NOTE — Assessment & Plan Note (Signed)
She is reluctant to have any repeat studies done at this time. Was relatively small that time. I think maybe next year I will try to readdress.

## 2015-06-24 NOTE — Assessment & Plan Note (Signed)
Now converted from Lipitor to Zocor 10 mg. Monitored by PCP. Do not have recent labs. During Zocor well

## 2015-06-24 NOTE — Assessment & Plan Note (Signed)
Well-controlled on hydralazine

## 2015-06-30 ENCOUNTER — Other Ambulatory Visit: Payer: Self-pay | Admitting: Cardiology

## 2015-06-30 DIAGNOSIS — I714 Abdominal aortic aneurysm, without rupture, unspecified: Secondary | ICD-10-CM

## 2015-07-09 ENCOUNTER — Ambulatory Visit (HOSPITAL_COMMUNITY)
Admission: RE | Admit: 2015-07-09 | Discharge: 2015-07-09 | Disposition: A | Discharge status: Home / Self Care | Payer: Medicare Other | Source: Ambulatory Visit | Attending: Cardiovascular Disease | Admitting: Cardiovascular Disease

## 2015-07-09 DIAGNOSIS — I708 Atherosclerosis of other arteries: Secondary | ICD-10-CM | POA: Diagnosis not present

## 2015-07-09 DIAGNOSIS — I714 Abdominal aortic aneurysm, without rupture, unspecified: Secondary | ICD-10-CM

## 2015-07-09 DIAGNOSIS — I700 Atherosclerosis of aorta: Secondary | ICD-10-CM | POA: Insufficient documentation

## 2015-07-09 DIAGNOSIS — N183 Chronic kidney disease, stage 3 (moderate): Secondary | ICD-10-CM | POA: Diagnosis not present

## 2015-07-09 DIAGNOSIS — E785 Hyperlipidemia, unspecified: Secondary | ICD-10-CM | POA: Diagnosis not present

## 2015-07-09 DIAGNOSIS — I129 Hypertensive chronic kidney disease with stage 1 through stage 4 chronic kidney disease, or unspecified chronic kidney disease: Secondary | ICD-10-CM | POA: Insufficient documentation

## 2015-07-24 ENCOUNTER — Telehealth: Payer: Self-pay | Admitting: *Deleted

## 2015-07-24 DIAGNOSIS — I714 Abdominal aortic aneurysm, without rupture, unspecified: Secondary | ICD-10-CM

## 2015-07-24 DIAGNOSIS — I70219 Atherosclerosis of native arteries of extremities with intermittent claudication, unspecified extremity: Secondary | ICD-10-CM

## 2015-07-24 DIAGNOSIS — I10 Essential (primary) hypertension: Secondary | ICD-10-CM

## 2015-07-24 NOTE — Telephone Encounter (Signed)
-----   Message from Leonie Man, MD sent at 07/23/2015  9:17 PM EST ----- Stable AAA findings.  No notable change from last year. Annual f/u recommended.  Leonie Man, MD

## 2015-07-24 NOTE — Telephone Encounter (Signed)
Spoke to patient. Result given . Verbalized understanding Order for annual doppler in Dec 2017

## 2015-08-05 ENCOUNTER — Other Ambulatory Visit: Payer: Self-pay | Admitting: Cardiology

## 2015-08-05 NOTE — Telephone Encounter (Signed)
Rx request sent to pharmacy.  

## 2016-04-05 ENCOUNTER — Other Ambulatory Visit: Payer: Self-pay

## 2016-06-27 ENCOUNTER — Ambulatory Visit (INDEPENDENT_AMBULATORY_CARE_PROVIDER_SITE_OTHER): Payer: Medicare Other | Admitting: Cardiology

## 2016-06-27 ENCOUNTER — Encounter: Payer: Self-pay | Admitting: Cardiology

## 2016-06-27 VITALS — BP 108/64 | HR 106 | Ht 59.0 in | Wt 85.6 lb

## 2016-06-27 DIAGNOSIS — N183 Chronic kidney disease, stage 3 unspecified: Secondary | ICD-10-CM

## 2016-06-27 DIAGNOSIS — I714 Abdominal aortic aneurysm, without rupture, unspecified: Secondary | ICD-10-CM

## 2016-06-27 DIAGNOSIS — E785 Hyperlipidemia, unspecified: Secondary | ICD-10-CM

## 2016-06-27 DIAGNOSIS — I10 Essential (primary) hypertension: Secondary | ICD-10-CM | POA: Diagnosis not present

## 2016-06-27 DIAGNOSIS — I70219 Atherosclerosis of native arteries of extremities with intermittent claudication, unspecified extremity: Secondary | ICD-10-CM | POA: Diagnosis not present

## 2016-06-27 DIAGNOSIS — R0989 Other specified symptoms and signs involving the circulatory and respiratory systems: Secondary | ICD-10-CM

## 2016-06-27 DIAGNOSIS — I739 Peripheral vascular disease, unspecified: Secondary | ICD-10-CM | POA: Diagnosis not present

## 2016-06-27 NOTE — Patient Instructions (Addendum)
SCHEDULE AT [place] Your physician has requested that you have an abdominal aorta duplex. During this test, an ultrasound is used to evaluate the aorta. Allow 30 minutes for this exam. Do not eat after midnight the day before and avoid carbonated beverages  Your physician has requested that you have a lower  extremity arterial duplex. This test is an ultrasound of the arteries in the legs. It looks at arterial blood flow in the legs. Allow one hour for Lower and Upper Arterial scans. There are no restrictions or special instructions  Your physician has requested that you have an ankle brachial index (ABI). During this test an ultrasound and blood pressure cuff are used to evaluate the arteries that supply the arms and legs with blood. Allow thirty minutes for this exam. There are no restrictions or special instructions.  Your physician has requested that you have a carotid duplex. This test is an ultrasound of the carotid arteries in your neck. It looks at blood flow through these arteries that supply the brain with blood. Allow one hour for this exam. There are no restrictions or special instructions.   Your physician wants you to follow-up in: [place] will receive a reminder letter in the mail two months in advance. If you don't receive a letter, please call our office to schedule the follow-up appointment.   If you need a refill on your cardiac medications before your next appointment, please call your pharmacy.

## 2016-06-27 NOTE — Progress Notes (Signed)
PCP: [name], MD  Clinic Note: Chief Complaint  Patient presents with  . Follow-up    Peripheral arterial disease with claudication  . AAA    HPI: Kristina [name] is a 82 y.o. female with a PMH below who presents today for annual f/u for Difficult control hypertension, PAD, AAA and HLD.  Kristina [name] was last seen on November 2016. Was doing well at that time.  Recent Hospitalizations: none  Studies Reviewed: December 2016  AAA Dopplers and lower extremity Dopplers - stable 2.8 x 3.1 AAA Stable dimensions of the infrarenal AAA measuring 2.8 cm x 3.1 cm.  Normal caliber common and external iliac arteries, bilaterally. Aorto-iliac atherosclerosis.  >50% bilateral common and external iliac artery stenosis. Patent IVC.  Interval History: Kristina [name] presents today in great mood. She really has no major complaints. She is chronically bothered by arthritis pains.  She continues to try to walk and does fine walking flat around the block. She has to stop occasionally but is able to go up or down her driveway about 6 times without finally getting worn out. She stops several times but is able to do much better than she was doing. She definitely notes having claudication symptoms usually worse in the right and left. Otherwise the claudication slows her down but does not limit her from doing what she wants to do as well as does not involve prolonged walking and going up and down stairs.  ++ claudication. - but stable From a cardiac standpoint she is essentially otherwise asymptomatic.  No chest pain or shortness of breath with rest or exertion.  No PND, orthopnea or edema.  No palpitations, lightheadedness, dizziness, weakness or syncope/near syncope. No TIA/amaurosis fugax symptoms.   ROS: A comprehensive was performed. Review of Systems  Constitutional: Negative for fever and malaise/fatigue.  HENT: Negative for congestion, hearing loss and nosebleeds.   Eyes: Negative.     Respiratory: Positive for cough (Pretty much every morning) and shortness of breath (No change from baseline). Negative for sputum production and wheezing.   Cardiovascular: Positive for claudication.       Otherwise negative per history of present illness  Gastrointestinal: Negative for blood in stool, constipation, heartburn and melena.  Genitourinary: Negative for hematuria.  Musculoskeletal: Positive for joint pain (Knees and ankles. Also about a month ago had pain on her right foot. It went away after about 2 days at with taking Aleve.).  Neurological: Negative for dizziness, seizures, loss of consciousness and weakness.  Endo/Heme/Allergies: Negative for environmental allergies.  Psychiatric/Behavioral: Negative for depression and memory loss. The patient is not nervous/anxious and does not have insomnia.     Past Medical History:  Diagnosis Date  . AAA (abdominal aortic aneurysm) (HCC)    Mild - measuring 2.8x2.8cm  . 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 Hyperlipidemia    Well-controlled  . Hypertension   . PAD (peripheral artery disease) (HCC)    LEA Dopplers: Right external iliac 40%, right mid SFA 70-99% with occlusion in Hunter's canal. Angiography. LEFT common iliac 60%, left SFA 70-9%, popliteal 50%; RIGHT posterior tibial occluded;; carotid Dopplers in 2009: Less than 50% stenosis.  . Renal artery stenosis [facility])     Past Surgical History:  Procedure Laterality Date  . LOWER EXTREMITY ARTERIAL DOPPLER  05/30/2011   Right EIA->50% daimeter reduction, Rt SFA 70-99% diameter reduction, Rt SFA-occlusive disease at Hunter's canal, Rt  PTA-appeared occluded, Lft CIA-moderate amount of calcific plaque w/ >60% diameter reduction, Lft prox SFA 70-99% diameter reduction, Lft POP-large amount of irregular mixed suggesting <50% diameter reduction  . NM MYOVIEW LTD  06/25/2008   No scintigraphic evidence  of inducible myocardial ischemia  . PERIPHERAL VASCULAR ANGIOGRAM  07/13/2009   No intervention - "plan on doing staged intervention in near future"  . TRANSTHORACIC ECHOCARDIOGRAM  11/14/2007   EF >55%, mild mitral and tricuspid regurg.   Current Meds  Medication Sig  . aspirin 325 MG tablet Take 325 mg by mouth daily.  . Cholecalciferol (VITAMIN D-3 PO) Take by mouth once a week.   . hydrALAZINE (APRESOLINE) 50 MG tablet take 1 tablet by mouth twice a day  . HYDROcodone-acetaminophen (NORCO/VICODIN) 5-325 MG per tablet Take 1 tablet by mouth every 6 (six) hours as needed for moderate pain.  . Oxycodone HCl 10 MG TABS Take 10 mg by mouth as directed.  . Polyethylene Glycol 3350 (MIRALAX PO) Take by mouth as needed.   . Probiotic Product (ALIGN) 4 MG CAPS Take by mouth daily.  . simvastatin (ZOCOR) 10 MG tablet Take 1 tablet (10 mg total) by mouth at bedtime.   Allergies  Allergen Reactions  . Lipitor [Atorvastatin]   . Pletal [Cilostazol]     Social History   Social History  . Marital status: Divorced    Spouse name: N/A  . Number of children: N/A  . Years of education: N/A   Social History Main Topics  . Smoking status: Former Smoker    Types: Cigarettes    Quit date: 07/11/2001  . Smokeless tobacco: None  . Alcohol use Yes  . Drug use: No  . Sexual activity: Not Asked   Other Topics Concern  . None   Social History Narrative   Single mother of 2, grandmother 1. Tries to exercise when she can, but is limited by claudication.   Former smoker who quit in the early 2000s.   Drinks occasional glass of wine   Family History  Problem Relation Age of Onset  . Heart attack Brother   . Cancer Brother     Bone cancer    Wt Readings from Last 3 Encounters:  06/27/16 38.8 kg (85 lb 9.6 oz)  06/22/15 41.3 kg (91 lb)  06/09/14 42.5 kg (93 lb 11.2 oz)    PHYSICAL EXAM BP 108/64   Pulse (!) 106   Ht 4\' 11"  (1.499 m)   Wt 38.8 kg (85 lb 9.6 oz)   BMI 17.29 kg/m    General: very pleasant, healthy appearing elderly woman. Appears younger than her stated age. She is in no acute distress, alert and oriented x3, answers questions appropriately. She is well groomed. Thin - significant thoracic kyphosis. HEENT: NCAT, EOMI, MMM. Anicteric sclerae. Neck: Supple, no LAN or JVD. + right carotid bruit  Heart: RRR, normal S1, S2. SoftSEM @ base & HSM heard at the apex, 1/6. Nondisplaced PMI. No other M./R./G.,  Lungs: CTAB with mild interstitial sounds bilaterally, but otherwise CTAB and nonlabored, no wheezes, rales or rhonchi.  Abdomen: Soft/NT/ND/NABS. No HSM.  Extremities: No C/C/E, diminished bilateral pulses, maybe 1+ bilaterally, no edema. + Bilateral femoral bruit. Diminished bilateral pedal pulses - trace right PT and 1+ DP bilaterally. Neuro: Grossly normal.    Adult ECG Report  Rate: 106 ;  Rhythm: sinus tachycardia and Left axis deviation and left ventricle hypertrophy. Questionable pulmonary disease pattern;   Narrative Interpretation: Stable EKG   Other  studies Reviewed: Additional studies/ records that were reviewed today include:  Recent Labs:  None available.    ASSESSMENT / PLAN: Problem List Items Addressed This Visit    Atherosclerotic PVD with intermittent claudication - near occlusive SFA disease with moderate iliac disease (Chronic)    She still has significant claudication but no evidence of clinical ischemia. It is somewhat limiting but for her is not lifestyle limiting. Not enough for her to undergo any invasive procedures with her single kidney. Unfortunately she did not tolerate Pletal. She also did not tolerate beta blockers and calcium channel blockers. The recommendation is to continue to try to walk even if she has to stop. Continue aspirin and statin.  Plan: Annual follow-up Dopplers.      Essential hypertension (Chronic)    Only on low-dose hydralazine with excellent control at this point. Continue current meds.       Relevant Orders   EKG 12-Lead (Completed)   VAS [place] LOWER EXTREMITY ARTERIAL DUPLEX   AAA Duplex   VAS [place] ABI WITH/WO TBI   VAS US CAROTID   Hyperlipidemia with target LDL less than 100 - Primary (Chronic)    Now on Zocor. Doing well without any complaints of myalgias. Labs are followed by PCP.      Relevant Orders   EKG 12-Lead (Completed)   VAS [place] LOWER EXTREMITY ARTERIAL DUPLEX   AAA Duplex   VAS [place] ABI WITH/WO TBI   VAS US CAROTID   AAA (abdominal aortic aneurysm) (HCC) (Chronic)    She's had relatively stable readings. I think we'll follow up at least one more time this year. If stable, would probably then maybe do it every other years of every year.      Relevant Orders   EKG 12-Lead (Completed)   VAS [place] LOWER EXTREMITY ARTERIAL DUPLEX   AAA Duplex   VAS [place] ABI WITH/WO TBI   VAS US CAROTID   Chronic kidney disease (CKD), stage III (moderate) (Chronic)    Essentially one functional kidney due to renal artery occlusion on the right side. As a result, has been reluctant for invasive evaluation. At present, we'll continue to monitor. Try to avoid invasive evaluation.  If necessary, we could use CO2 angiography      Claudication ([place])   Relevant Orders   EKG 12-Lead (Completed)   VAS [place] LOWER EXTREMITY ARTERIAL DUPLEX   AAA Duplex   VAS [place] ABI WITH/WO TBI   VAS US CAROTID    Other Visit Diagnoses    Carotid bruit, unspecified laterality       Relevant Orders   VAS US CAROTID      Current medicines are reviewed at length with the patient today. (+/- concerns) none The following changes have been made: none  Patient Instructions  SCHEDULE AT [place] has requested that you have an abdominal aorta duplex. During this test, an ultrasound is used to evaluate the aorta. Allow 30 minutes for this exam. Do not eat after midnight the day before and avoid carbonated beverages  Your physician has requested that you have a lower   extremity arterial duplex. This test is an ultrasound of the arteries in the legs. It looks at arterial blood flow in the legs. Allow one hour for Lower and Upper Arterial scans. There are no restrictions or special instructions  Your physician has requested that you have an ankle brachial index (ABI). During this test an ultrasound and blood pressure cuff are  used to evaluate the arteries that supply the arms and legs with blood. Allow thirty minutes for this exam. There are no restrictions or special instructions.  Your physician has requested that you have a carotid duplex. This test is an ultrasound of the carotid arteries in your neck. It looks at blood flow through these arteries that supply the brain with blood. Allow one hour for this exam. There are no restrictions or special instructions.   Your physician wants you to follow-up in: [place] will receive a reminder letter in the mail two months in advance. If you don't receive a letter, please call our office to schedule the follow-up appointment.   If you need a refill on your cardiac medications before your next appointment, please call your pharmacy.    Studies Ordered:   Orders Placed This Encounter  Procedures  . EKG 12-Lead      Glenetta Hew, M.D., M.S. Interventional Cardiologist   Pager # [phone] Phone # [phone] [address] [place], [place] [address]

## 2016-06-29 ENCOUNTER — Encounter: Payer: Self-pay | Admitting: Cardiology

## 2016-06-29 NOTE — Assessment & Plan Note (Addendum)
She still has significant claudication but no evidence of clinical ischemia. It is somewhat limiting but for her is not lifestyle limiting. Not enough for her to undergo any invasive procedures with her single kidney. Unfortunately she did not tolerate Pletal. She also did not tolerate beta blockers and calcium channel blockers. The recommendation is to continue to try to walk even if she has to stop. Continue aspirin and statin.  Plan: Annual follow-up Dopplers.

## 2016-06-29 NOTE — Assessment & Plan Note (Addendum)
Essentially one functional kidney due to renal artery occlusion on the right side. As a result, has been reluctant for invasive evaluation. At present, we'll continue to monitor. Try to avoid invasive evaluation.  If necessary, we could use CO2 angiography

## 2016-06-29 NOTE — Assessment & Plan Note (Signed)
She's had relatively stable readings. I think we'll follow up at least one more time this year. If stable, would probably then maybe do it every other years of every year.

## 2016-06-29 NOTE — Assessment & Plan Note (Signed)
Now on Zocor. Doing well without any complaints of myalgias. Labs are followed by PCP.

## 2016-06-29 NOTE — Assessment & Plan Note (Signed)
Only on low-dose hydralazine with excellent control at this point. Continue current meds.

## 2016-07-21 ENCOUNTER — Other Ambulatory Visit: Payer: Self-pay | Admitting: Cardiology

## 2016-07-21 DIAGNOSIS — I739 Peripheral vascular disease, unspecified: Secondary | ICD-10-CM

## 2016-07-21 DIAGNOSIS — I714 Abdominal aortic aneurysm, without rupture, unspecified: Secondary | ICD-10-CM

## 2016-07-21 DIAGNOSIS — R0989 Other specified symptoms and signs involving the circulatory and respiratory systems: Secondary | ICD-10-CM

## 2016-07-21 DIAGNOSIS — I10 Essential (primary) hypertension: Secondary | ICD-10-CM

## 2016-08-02 ENCOUNTER — Other Ambulatory Visit: Payer: Self-pay | Admitting: Cardiology

## 2016-08-05 ENCOUNTER — Inpatient Hospital Stay (HOSPITAL_COMMUNITY): Admission: RE | Admit: 2016-08-05 | Payer: Medicare Other | Source: Ambulatory Visit

## 2016-08-05 ENCOUNTER — Other Ambulatory Visit: Payer: Self-pay | Admitting: *Deleted

## 2016-08-05 MED ORDER — HYDRALAZINE HCL 50 MG PO TABS: 50.0000 mg | ORAL_TABLET | Freq: Two times a day (BID) | ORAL | 3 refills | Status: DC

## 2016-08-05 NOTE — Telephone Encounter (Signed)
Patient called in regards to the rx for hydralazine. She would like to know why it was only authorized for a quantity if thirty.

## 2016-09-02 ENCOUNTER — Other Ambulatory Visit (HOSPITAL_COMMUNITY): Payer: Medicare Other

## 2016-09-02 ENCOUNTER — Encounter (HOSPITAL_COMMUNITY): Payer: Medicare Other

## 2016-11-23 ENCOUNTER — Other Ambulatory Visit: Payer: Self-pay | Admitting: Gastroenterology

## 2016-11-23 DIAGNOSIS — R1033 Periumbilical pain: Secondary | ICD-10-CM

## 2016-11-24 ENCOUNTER — Emergency Department (HOSPITAL_COMMUNITY)
Admission: EM | Admit: 2016-11-24 | Discharge: 2016-11-25 | Disposition: A | Discharge status: Home / Self Care | Payer: Medicare Other | Attending: Emergency Medicine | Admitting: Emergency Medicine

## 2016-11-24 ENCOUNTER — Ambulatory Visit (HOSPITAL_COMMUNITY): Payer: Medicare Other

## 2016-11-24 ENCOUNTER — Emergency Department (HOSPITAL_COMMUNITY): Payer: Medicare Other

## 2016-11-24 DIAGNOSIS — I129 Hypertensive chronic kidney disease with stage 1 through stage 4 chronic kidney disease, or unspecified chronic kidney disease: Secondary | ICD-10-CM | POA: Insufficient documentation

## 2016-11-24 DIAGNOSIS — Z87891 Personal history of nicotine dependence: Secondary | ICD-10-CM | POA: Insufficient documentation

## 2016-11-24 DIAGNOSIS — N183 Chronic kidney disease, stage 3 (moderate): Secondary | ICD-10-CM | POA: Insufficient documentation

## 2016-11-24 DIAGNOSIS — N390 Urinary tract infection, site not specified: Secondary | ICD-10-CM | POA: Diagnosis not present

## 2016-11-24 DIAGNOSIS — Z7982 Long term (current) use of aspirin: Secondary | ICD-10-CM | POA: Diagnosis not present

## 2016-11-24 DIAGNOSIS — R1084 Generalized abdominal pain: Secondary | ICD-10-CM

## 2016-11-24 DIAGNOSIS — Z79899 Other long term (current) drug therapy: Secondary | ICD-10-CM | POA: Insufficient documentation

## 2016-11-24 LAB — URINALYSIS, ROUTINE W REFLEX MICROSCOPIC
Bacteria, UA: NONE SEEN
Bilirubin Urine: NEGATIVE
GLUCOSE, UA: NEGATIVE mg/dL
Hgb urine dipstick: NEGATIVE
Ketones, ur: NEGATIVE mg/dL
Nitrite: NEGATIVE
PH: 5 (ref 5.0–8.0)
Protein, ur: NEGATIVE mg/dL
SPECIFIC GRAVITY, URINE: 1.012 (ref 1.005–1.030)

## 2016-11-24 LAB — CBC
HCT: 33.6 % — ABNORMAL LOW (ref 36.0–46.0)
Hemoglobin: 11.4 g/dL — ABNORMAL LOW (ref 12.0–15.0)
MCH: 30.9 pg (ref 26.0–34.0)
MCHC: 33.9 g/dL (ref 30.0–36.0)
MCV: 91.1 fL (ref 78.0–100.0)
PLATELETS: 486 10*3/uL — AB (ref 150–400)
RBC: 3.69 MIL/uL — AB (ref 3.87–5.11)
RDW: 13 % (ref 11.5–15.5)
WBC: 8.5 10*3/uL (ref 4.0–10.5)

## 2016-11-24 LAB — COMPREHENSIVE METABOLIC PANEL
ALBUMIN: 4.2 g/dL (ref 3.5–5.0)
ALT: 16 U/L (ref 14–54)
AST: 31 U/L (ref 15–41)
Alkaline Phosphatase: 51 U/L (ref 38–126)
Anion gap: 12 (ref 5–15)
BILIRUBIN TOTAL: 0.5 mg/dL (ref 0.3–1.2)
BUN: 18 mg/dL (ref 6–20)
CALCIUM: 9.9 mg/dL (ref 8.9–10.3)
CO2: 25 mmol/L (ref 22–32)
Chloride: 91 mmol/L — ABNORMAL LOW (ref 101–111)
Creatinine, Ser: 1.28 mg/dL — ABNORMAL HIGH (ref 0.44–1.00)
GFR calc Af Amer: 42 mL/min — ABNORMAL LOW (ref >60)
GFR calc non Af Amer: 36 mL/min — ABNORMAL LOW (ref >60)
GLUCOSE: 111 mg/dL — AB (ref 65–99)
Potassium: 3.6 mmol/L (ref 3.5–5.1)
SODIUM: 128 mmol/L — AB (ref 135–145)
TOTAL PROTEIN: 6.9 g/dL (ref 6.5–8.1)

## 2016-11-24 LAB — LIPASE, BLOOD: Lipase: 46 U/L (ref 11–51)

## 2016-11-24 MED ORDER — IOPAMIDOL (ISOVUE-300) INJECTION 61%
INTRAVENOUS | Status: AC
  Administered 2016-11-25: 75 mL
  Filled 2016-11-24: qty 75

## 2016-11-24 MED ORDER — SODIUM CHLORIDE 0.9 % IV BOLUS (SEPSIS)
500.0000 mL | Freq: Once | INTRAVENOUS | Status: AC
  Administered 2016-11-24: 500 mL via INTRAVENOUS

## 2016-11-24 MED ORDER — ONDANSETRON HCL 4 MG/2ML IJ SOLN
4.0000 mg | Freq: Once | INTRAMUSCULAR | Status: AC
  Administered 2016-11-24: 4 mg via INTRAVENOUS
  Filled 2016-11-24: qty 2

## 2016-11-24 MED ORDER — HYDRALAZINE HCL 20 MG/ML IJ SOLN
10.0000 mg | Freq: Once | INTRAMUSCULAR | Status: AC
  Administered 2016-11-24: 10 mg via INTRAVENOUS
  Filled 2016-11-24: qty 1

## 2016-11-24 NOTE — ED Notes (Signed)
Patient transported to Ultrasound 

## 2016-11-24 NOTE — ED Provider Notes (Signed)
[facility] DEPT Provider Note   CSN: [id number] Arrival date & time: 11/24/16  1935    By signing my name below, I, Macon Large, attest that this documentation has been prepared under the direction and in the presence of Orpah Greek, MD. Electronically Signed: Macon Large, ED Scribe. 11/24/16. 11:21 PM.  History   Chief Complaint Chief Complaint  Patient presents with  . Abdominal Pain   The history is provided by the patient and a relative. No language interpreter was used.   HPI Comments: Kristina [name] is a 83 y.o. female with PMHx of HTN, AAA, hyperlipidemia, who presents to the Emergency Department complaining of moderate, intermittent, generalized abdominal pain accompanied by constant nausea onset a week ago. She reports associated chills and episodic diarrhea. Pt notes her pain radiates to her upper chest and lower abdominal area. Per nurse note, pt states her pain is worsened with movement and ambulation. She states her pain is exacerbated with direct pressure. Per pt's daughter, she states that pt is scheduled for an abdominal ultrasound tomorrow, 04/27 by her PCP. No alleviating factors noted. Pt denies h/o of abdominal surgeries. She denies blood in stool, fever, vomiting.   Past Medical History:  Diagnosis Date  . AAA (abdominal aortic aneurysm) (HCC)    Mild - measuring 2.8x2.8cm  . 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 Hyperlipidemia    Well-controlled  . Hypertension   . PAD (peripheral artery disease) (HCC)    LEA Dopplers: Right external iliac 40%, right mid SFA 70-99% with occlusion in Hunter's canal. Angiography. LEFT common iliac 60%, left SFA 70-9%, popliteal 50%; RIGHT posterior tibial occluded;; carotid Dopplers in 2009: Less than 50% stenosis.  . Renal artery stenosis [facility])     Patient Active Problem List   Diagnosis Date Noted  . Claudication ([place])  06/27/2016  . 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 Essential hypertension   . Hyperlipidemia with target LDL less than 100   . Chronic kidney disease (CKD), stage III (moderate)   . AAA (abdominal aortic aneurysm) [facility])     Past Surgical History:  Procedure Laterality Date  . LOWER EXTREMITY ARTERIAL DOPPLER  05/30/2011   Right EIA->50% daimeter reduction, Rt SFA 70-99% diameter reduction, Rt SFA-occlusive disease at Hunter's canal, Rt PTA-appeared occluded, Lft CIA-moderate amount of calcific plaque w/ >60% diameter reduction, Lft prox SFA 70-99% diameter reduction, Lft POP-large amount of irregular mixed suggesting <50% diameter reduction  . NM MYOVIEW LTD  06/25/2008   No scintigraphic evidence of inducible myocardial ischemia  . PERIPHERAL VASCULAR ANGIOGRAM  07/13/2009   No intervention - "plan on doing staged intervention in near future"  . TRANSTHORACIC ECHOCARDIOGRAM  11/14/2007   EF >55%, mild mitral and tricuspid regurg.    OB History    No data available       Home Medications    Prior to Admission medications   Medication Sig Start Date End Date Taking? Authorizing Provider  aspirin 325 MG tablet Take 325 mg by mouth daily.    Historical Provider, MD  cephALEXin (KEFLEX) 500 MG capsule Take 1 capsule (500 mg total) by mouth 2 (two) times daily. 11/25/16   Orpah Greek, MD  Cholecalciferol (VITAMIN D-3 PO) Take by mouth once a week.     Historical Provider, MD  hydrALAZINE (APRESOLINE) 50 MG tablet Take 1 tablet (50 mg total) by mouth 2 (two) times daily. 08/05/16   Leonie Man, MD  HYDROcodone-acetaminophen (NORCO/VICODIN) 5-325 MG per tablet Take 1 tablet by mouth every 6 (six) hours as needed for moderate pain.    Historical Provider, MD  Oxycodone HCl 10 MG TABS Take 10 mg by mouth as directed.    Historical Provider, MD  Polyethylene Glycol 3350  (MIRALAX PO) Take by mouth as needed.     Historical Provider, MD  Probiotic Product (ALIGN) 4 MG CAPS Take by mouth daily.    Historical Provider, MD  simvastatin (ZOCOR) 10 MG tablet Take 1 tablet (10 mg total) by mouth at bedtime. 07/11/13   Leonie Man, MD  traMADol (ULTRAM) 50 MG tablet Take 0.5 tablets (25 mg total) by mouth every 6 (six) hours as needed for moderate pain. 11/25/16   Orpah Greek, MD    Family History Family History  Problem Relation Age of Onset  . Heart attack Brother   . Cancer Brother     Bone cancer    Social History Social History  Substance Use Topics  . Smoking status: Former Smoker    Types: Cigarettes    Quit date: 07/11/2001  . Smokeless tobacco: Not on file  . Alcohol use Yes     Allergies   Lipitor [atorvastatin] and Pletal [cilostazol]   Review of Systems Review of Systems  Constitutional: Negative for fever.  Gastrointestinal: Positive for abdominal pain and nausea. Negative for blood in stool and vomiting.  All other systems reviewed and are negative.    Physical Exam Updated Vital Signs BP (!) 147/67   Pulse 76   Temp 97.4 F (36.3 C) (Oral)   Resp (!) 22   Ht 4\' 11"  (1.499 m)   Wt 80 lb (36.3 kg)   SpO2 94%   BMI 16.16 kg/m   Physical Exam  Constitutional: She is oriented to person, place, and time. She appears well-developed and well-nourished. No distress.  HENT:  Head: Normocephalic and atraumatic.  Right Ear: Hearing normal.  Left Ear: Hearing normal.  Nose: Nose normal.  Mouth/Throat: Oropharynx is clear and moist and mucous membranes are normal.  Eyes: Conjunctivae and EOM are normal. Pupils are equal, round, and reactive to light.  Neck: Normal range of motion. Neck supple.  Cardiovascular: Regular rhythm, S1 normal and S2 normal.  Exam reveals no gallop and no friction rub.   No murmur heard. Pulmonary/Chest: Effort normal and breath sounds normal. No respiratory distress. She exhibits no  tenderness.  Abdominal: Soft. Normal appearance and bowel sounds are normal. There is no hepatosplenomegaly. There is tenderness. There is no rebound, no guarding, no tenderness at McBurney's point and negative Murphy's sign. No hernia.  Diffused abdominal tenderness in lower abdominal region.   Musculoskeletal: Normal range of motion.  Neurological: She is alert and oriented to person, place, and time. She has normal strength. No cranial nerve deficit or sensory deficit. Coordination normal. GCS eye subscore is 4. GCS verbal subscore is 5. GCS motor subscore is 6.  Skin: Skin is warm, dry and intact. No rash noted. No cyanosis.  Psychiatric: She has a normal mood and affect. Her speech is normal and behavior is normal. Thought content normal.  Nursing note and vitals reviewed.    ED Treatments / Results   DIAGNOSTIC STUDIES: Oxygen Saturation is 99% on RA, normal by my interpretation.    COORDINATION OF CARE: 11:16 PM Discussed  treatment plan with pt at bedside which includes labs, abdominal imaging, CT imaging and nausea medication and pt agreed to plan.   Labs (all labs ordered are listed, but only abnormal results are displayed) Labs Reviewed  COMPREHENSIVE METABOLIC PANEL - Abnormal; Notable for the following:       Result Value   Sodium 128 (*)    Chloride 91 (*)    Glucose, Bld 111 (*)    Creatinine, Ser 1.28 (*)    GFR calc non Af Amer 36 (*)    GFR calc Af Amer 42 (*)    All other components within normal limits  CBC - Abnormal; Notable for the following:    RBC 3.69 (*)    Hemoglobin 11.4 (*)    HCT 33.6 (*)    Platelets 486 (*)    All other components within normal limits  URINALYSIS, ROUTINE W REFLEX MICROSCOPIC - Abnormal; Notable for the following:    Leukocytes, UA SMALL (*)    Squamous Epithelial / LPF 0-5 (*)    All other components within normal limits  URINE CULTURE  LIPASE, BLOOD    EKG  EKG Interpretation None       Radiology Ct Abdomen  Pelvis W Contrast  Result Date: 11/25/2016 CLINICAL DATA:  83 year old female with generalized abdominal pain. EXAM: CT ABDOMEN AND PELVIS WITH CONTRAST TECHNIQUE: Multidetector CT imaging of the abdomen and pelvis was performed using the standard protocol following bolus administration of intravenous contrast. CONTRAST:  30mL ISOVUE-300 IOPAMIDOL (ISOVUE-300) INJECTION 61% COMPARISON:  Abdominal ultrasound dated 11/24/2016 FINDINGS: Lower chest: There is mild emphysematous changes of the visualized lung. The visualized lung bases are otherwise clear. Multi vessel coronary vascular calcification. There is no intra-abdominal free air or free fluid. Hepatobiliary: No focal liver abnormality is seen. No gallstones, gallbladder wall thickening, or biliary dilatation. Pancreas: There is scattered coarse calcification of the pancreas most consistent with sequela of chronic pancreatitis. No evidence of acute pancreatitis. Correlation with pancreatic enzymes recommended. There is no dilatation of the main pancreatic duct. No peripancreatic fluid collection. Spleen: Normal in size without focal abnormality. Adrenals/Urinary Tract: Left adrenal thickening or nodularity measuring up to 10 mm and may be related to underlying adenoma. The right adrenal gland appears unremarkable. The left kidney is atrophic. The right kidney appears unremarkable with homogeneous enhancement. Subcentimeter right renal inferior pole hypodense lesion is not well characterized but likely represents a cyst. There is no hydronephrosis. The visualized ureters and urinary bladder appear unremarkable. Stomach/Bowel: There is severe sigmoid diverticulosis without active inflammatory changes. There is no evidence of bowel obstruction or active inflammation. Normal appendix. Vascular/Lymphatic: There is advanced aortoiliac atherosclerotic disease. There is a fusiform infrarenal abdominal aortic aneurysm measuring up to 3.6 cm in transverse diameter and  approximately 4 cm in length. The IVC appears unremarkable. No portal venous gas identified. There is no adenopathy. Reproductive: Probable small calcified uterine granuloma. The ovaries are grossly unremarkable as visualized. Other: None Musculoskeletal: Osteopenia with degenerative changes of the spine. Grade 1 L3-L4 and L4-L5 anterolisthesis. L2-L3 disc desiccation with vacuum phenomena. No acute fracture. IMPRESSION: 1. No acute intra-abdominopelvic pathology. No evidence of bowel obstruction or active inflammation. Normal appendix. 2. Extensive sigmoid diverticulosis. 3. Scattered pancreatic calcifications sequela of chronic pancreatitis. Correlation with pancreatic enzymes recommended to exclude acute on chronic pancreatitis. 4. Atrophic left kidney.  Unremarkable right kidney. 5. A 1 cm left adrenal indeterminate nodule, possibly an adenoma. 6. Advanced aortic atherosclerosis with a 3.6 cm fusiform infrarenal abdominal  aortic aneurysm. Recommend followup by ultrasound in 2 years. This recommendation follows ACR consensus guidelines: White Paper of the ACR Incidental Findings Committee II on Vascular Findings. J Am Coll Radiol 2013; 10:789-794. Electronically Signed   By: Anner Crete M.D.   On: 11/25/2016 02:52   US Abdomen Limited Ruq  Result Date: 11/25/2016 CLINICAL DATA:  Abdominal pain for 1 week EXAM: US ABDOMEN LIMITED - RIGHT UPPER QUADRANT COMPARISON:  None. FINDINGS: Gallbladder: No shadowing stones. Gallbladder polyp measuring up to 3 mm. Wall thickness within normal limits. Negative sonographic Murphy Common bile duct: Diameter: 3.7 mm Liver: No focal lesion identified. Within normal limits in parenchymal echogenicity. IMPRESSION: 1. Gallbladder polyp measuring up to 3 mm. No sonographic evidence for acute cholecystitis. 2. No biliary dilatation Electronically Signed   By: Donavan Foil M.D.   On: 11/25/2016 00:14    Procedures Procedures (including critical care time)  Medications  Ordered in ED Medications  hydrALAZINE (APRESOLINE) tablet 50 mg (50 mg Oral Refused 11/25/16 0215)  sodium chloride 0.9 % bolus 500 mL (0 mLs Intravenous Stopped 11/25/16 0141)  ondansetron (ZOFRAN) injection 4 mg (4 mg Intravenous Given 11/24/16 2326)  hydrALAZINE (APRESOLINE) injection 10 mg (10 mg Intravenous Given 11/24/16 2332)  iopamidol (ISOVUE-300) 61 % injection (75 mLs  Contrast Given 11/25/16 0148)  cephALEXin (KEFLEX) capsule 500 mg (500 mg Oral Given 11/25/16 0310)     Initial Impression / Assessment and Plan / ED Course  I have reviewed the triage vital signs and the nursing notes.  Pertinent labs & imaging results that were available during my care of the patient were reviewed by me and considered in my medical decision making (see chart for details).     Patient presents to the emergency department with complaints of abdominal pain. Symptoms have been ongoing for one week. She has seen her primary care doctor for this, has an outpatient ultrasound scheduled for tomorrow. Pain worsened tonight. Examination revealed diffuse tenderness without guarding or rebound. Blood work was normal. She does not have a leukocytosis. Urinalysis is suggestive of infection, culture pending. Treat with Keflex and follow closely.  Patient did have upper abdominal tenderness. Ultrasound was performed, gallbladder disease was not found. Patient therefore underwent CT scan. No acute abnormality was noted on CT scan. Patient was reassured, will treat for the urinary tract infection and have close follow-up with primary care doctor.  Final Clinical Impressions(s) / ED Diagnoses   Final diagnoses:  Generalized abdominal pain  Lower urinary tract infectious disease    New Prescriptions Discharge Medication List as of 11/25/2016  3:06 AM    START taking these medications   Details  cephALEXin (KEFLEX) 500 MG capsule Take 1 capsule (500 mg total) by mouth 2 (two) times daily., Starting Fri 11/25/2016,  Print    traMADol (ULTRAM) 50 MG tablet Take 0.5 tablets (25 mg total) by mouth every 6 (six) hours as needed for moderate pain., Starting Fri 11/25/2016, Print        I personally performed the services described in this documentation, which was scribed in my presence. The recorded information has been reviewed and is accurate.     Orpah Greek, MD 11/25/16 [phone]

## 2016-11-24 NOTE — ED Triage Notes (Signed)
Presents generalized abdominal pain that began one week ago, nothing makes pain better assocaited with chills and diarrhea that began today. Movement, eating and walking makes pain worse. Denies vomiting endorses constant nausea. Denies confusion, denies feeling confused. Pain is undescribable. Abdomen is tender tto palpation. She has an abdominal ultrasound scheduled for tomorrow by her PCP-her daughter brought her in because the pain is worse and she is unable to eat anything.

## 2016-11-25 ENCOUNTER — Ambulatory Visit (HOSPITAL_COMMUNITY): Payer: Medicare Other

## 2016-11-25 ENCOUNTER — Emergency Department (HOSPITAL_COMMUNITY): Payer: Medicare Other

## 2016-11-25 DIAGNOSIS — N390 Urinary tract infection, site not specified: Secondary | ICD-10-CM | POA: Diagnosis not present

## 2016-11-25 MED ORDER — CEPHALEXIN 250 MG PO CAPS
500.0000 mg | ORAL_CAPSULE | Freq: Once | ORAL | Status: AC
  Administered 2016-11-25: 500 mg via ORAL
  Filled 2016-11-25: qty 2

## 2016-11-25 MED ORDER — CEPHALEXIN 500 MG PO CAPS: 500.0000 mg | ORAL_CAPSULE | Freq: Two times a day (BID) | ORAL | 0 refills | Status: DC

## 2016-11-25 MED ORDER — HYDRALAZINE HCL 25 MG PO TABS
50.0000 mg | ORAL_TABLET | Freq: Once | ORAL | Status: DC
  Filled 2016-11-25: qty 2

## 2016-11-25 MED ORDER — TRAMADOL HCL 50 MG PO TABS: 25.0000 mg | ORAL_TABLET | Freq: Four times a day (QID) | ORAL | 0 refills | Status: DC | PRN

## 2016-11-25 NOTE — ED Notes (Signed)
Pt stable, understands discharge instructions, and reasons for return.   

## 2016-11-25 NOTE — ED Notes (Signed)
Pt family upset regarding length of stay in ED. Advised regarding current delay of CT exams. States she may not wait for exam results. MD made aware.

## 2016-11-26 LAB — URINE CULTURE

## 2016-11-29 DIAGNOSIS — E871 Hypo-osmolality and hyponatremia: Secondary | ICD-10-CM

## 2016-11-29 HISTORY — DX: Hypo-osmolality and hyponatremia: E87.1

## 2016-11-30 ENCOUNTER — Other Ambulatory Visit: Payer: Self-pay | Admitting: Internal Medicine

## 2016-11-30 DIAGNOSIS — R42 Dizziness and giddiness: Secondary | ICD-10-CM

## 2016-12-01 ENCOUNTER — Encounter (HOSPITAL_COMMUNITY): Payer: Self-pay | Admitting: General Practice

## 2016-12-01 ENCOUNTER — Inpatient Hospital Stay (HOSPITAL_COMMUNITY)
Admission: AD | Admit: 2016-12-01 | Discharge: 2016-12-03 | DRG: 644 | Disposition: A | Discharge status: Home Health | Payer: Medicare Other | Source: Ambulatory Visit | Attending: Internal Medicine | Admitting: Internal Medicine

## 2016-12-01 DIAGNOSIS — R1013 Epigastric pain: Secondary | ICD-10-CM | POA: Diagnosis present

## 2016-12-01 DIAGNOSIS — N183 Chronic kidney disease, stage 3 unspecified: Secondary | ICD-10-CM | POA: Diagnosis present

## 2016-12-01 DIAGNOSIS — Z681 Body mass index (BMI) 19 or less, adult: Secondary | ICD-10-CM | POA: Diagnosis not present

## 2016-12-01 DIAGNOSIS — Z7982 Long term (current) use of aspirin: Secondary | ICD-10-CM

## 2016-12-01 DIAGNOSIS — K8689 Other specified diseases of pancreas: Secondary | ICD-10-CM | POA: Diagnosis present

## 2016-12-01 DIAGNOSIS — I10 Essential (primary) hypertension: Secondary | ICD-10-CM

## 2016-12-01 DIAGNOSIS — Z808 Family history of malignant neoplasm of other organs or systems: Secondary | ICD-10-CM

## 2016-12-01 DIAGNOSIS — K8681 Exocrine pancreatic insufficiency: Secondary | ICD-10-CM | POA: Diagnosis present

## 2016-12-01 DIAGNOSIS — D649 Anemia, unspecified: Secondary | ICD-10-CM | POA: Diagnosis present

## 2016-12-01 DIAGNOSIS — R627 Adult failure to thrive: Secondary | ICD-10-CM | POA: Diagnosis present

## 2016-12-01 DIAGNOSIS — Z8249 Family history of ischemic heart disease and other diseases of the circulatory system: Secondary | ICD-10-CM | POA: Diagnosis not present

## 2016-12-01 DIAGNOSIS — I739 Peripheral vascular disease, unspecified: Secondary | ICD-10-CM | POA: Diagnosis present

## 2016-12-01 DIAGNOSIS — E46 Unspecified protein-calorie malnutrition: Secondary | ICD-10-CM | POA: Diagnosis present

## 2016-12-01 DIAGNOSIS — E860 Dehydration: Secondary | ICD-10-CM | POA: Diagnosis present

## 2016-12-01 DIAGNOSIS — D509 Iron deficiency anemia, unspecified: Secondary | ICD-10-CM | POA: Diagnosis present

## 2016-12-01 DIAGNOSIS — Z87891 Personal history of nicotine dependence: Secondary | ICD-10-CM

## 2016-12-01 DIAGNOSIS — Z66 Do not resuscitate: Secondary | ICD-10-CM | POA: Diagnosis present

## 2016-12-01 DIAGNOSIS — E871 Hypo-osmolality and hyponatremia: Secondary | ICD-10-CM

## 2016-12-01 DIAGNOSIS — I714 Abdominal aortic aneurysm, without rupture: Secondary | ICD-10-CM | POA: Diagnosis present

## 2016-12-01 DIAGNOSIS — Z79899 Other long term (current) drug therapy: Secondary | ICD-10-CM | POA: Diagnosis not present

## 2016-12-01 DIAGNOSIS — E785 Hyperlipidemia, unspecified: Secondary | ICD-10-CM | POA: Diagnosis not present

## 2016-12-01 DIAGNOSIS — E440 Moderate protein-calorie malnutrition: Secondary | ICD-10-CM

## 2016-12-01 DIAGNOSIS — I129 Hypertensive chronic kidney disease with stage 1 through stage 4 chronic kidney disease, or unspecified chronic kidney disease: Secondary | ICD-10-CM | POA: Diagnosis present

## 2016-12-01 DIAGNOSIS — E222 Syndrome of inappropriate secretion of antidiuretic hormone: Secondary | ICD-10-CM | POA: Diagnosis present

## 2016-12-01 DIAGNOSIS — N390 Urinary tract infection, site not specified: Secondary | ICD-10-CM | POA: Diagnosis present

## 2016-12-01 DIAGNOSIS — Z888 Allergy status to other drugs, medicaments and biological substances status: Secondary | ICD-10-CM

## 2016-12-01 DIAGNOSIS — K861 Other chronic pancreatitis: Secondary | ICD-10-CM | POA: Diagnosis present

## 2016-12-01 HISTORY — DX: Hypo-osmolality and hyponatremia: E87.1

## 2016-12-01 HISTORY — DX: Unspecified hearing loss, unspecified ear: H91.90

## 2016-12-01 LAB — IRON AND TIBC
Iron: 23 ug/dL — ABNORMAL LOW (ref 28–170)
SATURATION RATIOS: 7 % — AB (ref 10.4–31.8)
TIBC: 353 ug/dL (ref 250–450)
UIBC: 330 ug/dL

## 2016-12-01 LAB — FERRITIN: FERRITIN: 34 ng/mL (ref 11–307)

## 2016-12-01 LAB — BASIC METABOLIC PANEL
Anion gap: 10 (ref 5–15)
BUN: 24 mg/dL — AB (ref 6–20)
CO2: 26 mmol/L (ref 22–32)
CREATININE: 1.24 mg/dL — AB (ref 0.44–1.00)
Calcium: 9.4 mg/dL (ref 8.9–10.3)
Chloride: 85 mmol/L — ABNORMAL LOW (ref 101–111)
GFR calc Af Amer: 44 mL/min — ABNORMAL LOW (ref >60)
GFR calc non Af Amer: 38 mL/min — ABNORMAL LOW (ref >60)
Glucose, Bld: 95 mg/dL (ref 65–99)
Potassium: 4.8 mmol/L (ref 3.5–5.1)
Sodium: 121 mmol/L — ABNORMAL LOW (ref 135–145)

## 2016-12-01 LAB — URINALYSIS, ROUTINE W REFLEX MICROSCOPIC
BILIRUBIN URINE: NEGATIVE
GLUCOSE, UA: NEGATIVE mg/dL
Hgb urine dipstick: NEGATIVE
KETONES UR: NEGATIVE mg/dL
LEUKOCYTES UA: NEGATIVE
Nitrite: NEGATIVE
PH: 5 (ref 5.0–8.0)
Protein, ur: NEGATIVE mg/dL
SPECIFIC GRAVITY, URINE: 1.009 (ref 1.005–1.030)

## 2016-12-01 LAB — SODIUM, URINE, RANDOM: Sodium, Ur: 29 mmol/L

## 2016-12-01 LAB — OSMOLALITY: OSMOLALITY: 262 mosm/kg — AB (ref 275–295)

## 2016-12-01 LAB — RETICULOCYTES
RBC.: 3.7 MIL/uL — AB (ref 3.87–5.11)
RETIC COUNT ABSOLUTE: 40.7 10*3/uL (ref 19.0–186.0)
RETIC CT PCT: 1.1 % (ref 0.4–3.1)

## 2016-12-01 LAB — TSH: TSH: 1.109 u[IU]/mL (ref 0.350–4.500)

## 2016-12-01 LAB — PREALBUMIN: PREALBUMIN: 24.1 mg/dL (ref 18–38)

## 2016-12-01 LAB — CORTISOL: CORTISOL PLASMA: 12.5 ug/dL

## 2016-12-01 LAB — LIPASE, BLOOD: Lipase: 70 U/L — ABNORMAL HIGH (ref 11–51)

## 2016-12-01 LAB — FOLATE: Folate: 13.2 ng/mL (ref >5.9)

## 2016-12-01 LAB — VITAMIN B12: VITAMIN B 12: 217 pg/mL (ref 180–914)

## 2016-12-01 LAB — OSMOLALITY, URINE: Osmolality, Ur: 298 mOsm/kg — ABNORMAL LOW (ref 300–900)

## 2016-12-01 MED ORDER — OXYCODONE HCL 10 MG PO TABS: 10.0000 mg | ORAL_TABLET | ORAL | Status: DC

## 2016-12-01 MED ORDER — ONDANSETRON HCL 4 MG/2ML IJ SOLN: 4.0000 mg | Freq: Four times a day (QID) | INTRAMUSCULAR | Status: DC | PRN

## 2016-12-01 MED ORDER — ACETAMINOPHEN 650 MG RE SUPP: 650.0000 mg | Freq: Four times a day (QID) | RECTAL | Status: DC | PRN

## 2016-12-01 MED ORDER — ENSURE ENLIVE PO LIQD
237.0000 mL | Freq: Two times a day (BID) | ORAL | Status: DC
  Administered 2016-12-02 – 2016-12-03 (×3): 237 mL via ORAL

## 2016-12-01 MED ORDER — SODIUM CHLORIDE 0.9% FLUSH
3.0000 mL | Freq: Two times a day (BID) | INTRAVENOUS | Status: DC
  Administered 2016-12-03: 3 mL via INTRAVENOUS

## 2016-12-01 MED ORDER — ASPIRIN EC 325 MG PO TBEC
325.0000 mg | DELAYED_RELEASE_TABLET | Freq: Every day | ORAL | Status: DC
  Administered 2016-12-02 – 2016-12-03 (×2): 325 mg via ORAL
  Filled 2016-12-01 (×2): qty 1

## 2016-12-01 MED ORDER — ONDANSETRON HCL 4 MG PO TABS: 4.0000 mg | ORAL_TABLET | Freq: Four times a day (QID) | ORAL | Status: DC | PRN

## 2016-12-01 MED ORDER — SIMVASTATIN 10 MG PO TABS
10.0000 mg | ORAL_TABLET | Freq: Every day | ORAL | Status: DC
  Administered 2016-12-01 – 2016-12-02 (×2): 10 mg via ORAL
  Filled 2016-12-01 (×2): qty 1

## 2016-12-01 MED ORDER — TRAMADOL HCL 50 MG PO TABS
25.0000 mg | ORAL_TABLET | Freq: Four times a day (QID) | ORAL | Status: DC | PRN
  Administered 2016-12-01: 25 mg via ORAL
  Filled 2016-12-01: qty 1

## 2016-12-01 MED ORDER — ENOXAPARIN SODIUM 40 MG/0.4ML ~~LOC~~ SOLN
40.0000 mg | SUBCUTANEOUS | Status: DC
  Administered 2016-12-01 – 2016-12-02 (×2): 40 mg via SUBCUTANEOUS
  Filled 2016-12-01 (×2): qty 0.4

## 2016-12-01 MED ORDER — HYDROCODONE-ACETAMINOPHEN 5-325 MG PO TABS: 1.0000 | ORAL_TABLET | Freq: Four times a day (QID) | ORAL | Status: DC | PRN

## 2016-12-01 MED ORDER — HYDRALAZINE HCL 20 MG/ML IJ SOLN: 5.0000 mg | Freq: Four times a day (QID) | INTRAMUSCULAR | Status: DC | PRN

## 2016-12-01 MED ORDER — ACETAMINOPHEN 325 MG PO TABS
650.0000 mg | ORAL_TABLET | Freq: Four times a day (QID) | ORAL | Status: DC | PRN
Start: 2016-12-01 — End: 2016-12-03

## 2016-12-01 MED ORDER — SODIUM CHLORIDE 0.9 % IV SOLN
INTRAVENOUS | Status: DC
  Administered 2016-12-01 – 2016-12-02 (×2): via INTRAVENOUS

## 2016-12-01 NOTE — Progress Notes (Signed)
Bladder scan showed 161mL. Pt. Then voided 128mL.

## 2016-12-01 NOTE — H&P (Signed)
History and Physical    Kristina [name] GQQ:[id number] DOB: [birth year] DOA: 12/01/2016   PCP: [name], MD   Patient coming from/Resides with: Private residence/lives alone but recently son has been staying with her due to acute illness  Admission status: Inpatient/telemetry -medically necessary to stay a minimum 2 midnights to rule out impending and/or unexpected changes in physiologic status that may differ from initial evaluation performed in the ER and/or at time of admission. Presents with recurrent hyponatremia in setting of failure to thrive symptoms, poor oral intake and epigastric abdominal pain. Patient is not on offending medications, does not drink alcohol beverages and has not been drinking excessive amounts of water. She'll be admitted for SIADH rule out, we will check orthostatic vital signs noting if she is orthostatic this may be reflective of dehydration etiology, she also is reporting abdominal pain so we'll check H. pylori serologies. Her sodium was reported as 121 today on outpatient labs but unfortunately these labs were not sent with the patient.  Chief Complaint: Generalized weakness  HPI: Kristina [name] is a 83 y.o. female with medical history significant for hypertension, stage III chronic kidney disease in setting of solitary kidney, anemia, pancreatic calcification on CT without history of pancreatitis, and dyslipidemia. She also has peripheral vascular disease with claudication symptoms and is followed by cardiovascular physician in the outpatient setting. About 2 weeks ago patient began developing generalized weakness, nausea and dizziness and intermittent epigastric pain. She presented to the ER on 4/26. Her urinalysis was abnormal and concerning for UTI so she was empirically started on Keflex. Her symptoms did not improve and subsequent urine culture did not demonstrate any bacterial growth so antibiotics were discontinued. She presented to Dr. Collene Mares today for  evaluation due to recurrent generalized weakness. Labs were obtained in the outpatient setting and sodium was reported at 121 although hard copy of these labs were not sent with the patient at time of admission. In questioning the patient she does not drink any beer or other alcoholic beverages. She has not started any new medications. She does not take diuretics or antidepressant medications. She's been trying to drink water but typically is only able to drink 28 ounce glasses per day. She has not had any diarrhea or emesis. She reports continued generalized weakness. She has not had any fevers but has felt chills.   Review of Systems:  In addition to the HPI above,  No Fever, myalgias or other constitutional symptoms No Headache, changes with Vision or hearing, new focal weakness, tingling, numbness in any extremity, dysarthria or word finding difficulty, gait disturbance or imbalance, tremors or seizure activity No problems swallowing food or Liquids,  choking or coughing while eating, abdominal pain with or after eating No Chest pain, Cough or Shortness of Breath, palpitations, orthopnea or DOE No emesis, melena,hematochezia, dark tarry stools No dysuria, malodorous urine, hematuria or flank pain No new skin rashes, lesions, masses or bruises, No new joint pains, aches, swelling or redness No recent unintentional weight gain or loss No polyuria, polydypsia or polyphagia   Past Medical History:  Diagnosis Date  . AAA (abdominal aortic aneurysm) (HCC)    Mild - measuring 2.8x2.8cm  . 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 Hyperlipidemia    Well-controlled  . Hypertension   . PAD (peripheral artery disease) (HCC)    LEA Dopplers: Right external iliac 40%, right mid SFA 70-99% with  occlusion in Hunter's canal. Angiography. LEFT common iliac 60%, left SFA 70-9%, popliteal 50%; RIGHT posterior tibial occluded;;  carotid Dopplers in 2009: Less than 50% stenosis.  . Renal artery stenosis [facility])     Past Surgical History:  Procedure Laterality Date  . LOWER EXTREMITY ARTERIAL DOPPLER  05/30/2011   Right EIA->50% daimeter reduction, Rt SFA 70-99% diameter reduction, Rt SFA-occlusive disease at Hunter's canal, Rt PTA-appeared occluded, Lft CIA-moderate amount of calcific plaque w/ >60% diameter reduction, Lft prox SFA 70-99% diameter reduction, Lft POP-large amount of irregular mixed suggesting <50% diameter reduction  . NM MYOVIEW LTD  06/25/2008   No scintigraphic evidence of inducible myocardial ischemia  . PERIPHERAL VASCULAR ANGIOGRAM  07/13/2009   No intervention - "plan on doing staged intervention in near future"  . TRANSTHORACIC ECHOCARDIOGRAM  11/14/2007   EF >55%, mild mitral and tricuspid regurg.    Social History   Social History  . Marital status: Divorced    Spouse name: N/A  . Number of children: N/A  . Years of education: N/A   Occupational History  . Not on file.   Social History Main Topics  . Smoking status: Former Smoker    Types: Cigarettes    Quit date: 07/11/2001  . Smokeless tobacco: Not on file  . Alcohol use Yes  . Drug use: No  . Sexual activity: Not on file   Other Topics Concern  . Not on file   Social History Narrative   Single mother of 2, grandmother 1. Tries to exercise when she can, but is limited by claudication.   Former smoker who quit in the early 2000s.   Drinks occasional glass of wineIn the past but currently denies active alcohol use     Mobility: Independent Work history: Not obtained   Allergies  Allergen Reactions  . Lipitor [Atorvastatin]   . Pletal [Cilostazol]     Family History  Problem Relation Age of Onset  . Heart attack Brother   . Cancer Brother     Bone cancer     Prior to Admission medications   Medication Sig Start Date End Date Taking? Authorizing Provider  aspirin 325 MG tablet Take 325 mg by mouth daily.     Historical Provider, MD  cephALEXin (KEFLEX) 500 MG capsule Take 1 capsule (500 mg total) by mouth 2 (two) times daily. 11/25/16   Orpah Greek, MD  Cholecalciferol (VITAMIN D-3 PO) Take by mouth once a week.     Historical Provider, MD  hydrALAZINE (APRESOLINE) 50 MG tablet Take 1 tablet (50 mg total) by mouth 2 (two) times daily. 08/05/16   Leonie Man, MD  HYDROcodone-acetaminophen (NORCO/VICODIN) 5-325 MG per tablet Take 1 tablet by mouth every 6 (six) hours as needed for moderate pain.    Historical Provider, MD  Oxycodone HCl 10 MG TABS Take 10 mg by mouth as directed.    Historical Provider, MD  Polyethylene Glycol 3350 (MIRALAX PO) Take by mouth as needed.     Historical Provider, MD  Probiotic Product (ALIGN) 4 MG CAPS Take by mouth daily.    Historical Provider, MD  simvastatin (ZOCOR) 10 MG tablet Take 1 tablet (10 mg total) by mouth at bedtime. 07/11/13   Leonie Man, MD  traMADol (ULTRAM) 50 MG tablet Take 0.5 tablets (25 mg total) by mouth every 6 (six) hours as needed for moderate pain. 11/25/16   Orpah Greek, MD    Physical Exam: Vitals:   12/01/16 1559  BP: Marland Kitchen)  103/47  Pulse: 76  Resp: 18  Temp: 99.3 F (37.4 C)  TempSrc: Oral  SpO2: 97%      Constitutional: NAD, calm, comfortable-Pleasant Eyes: PERRL, lids and conjunctivae normal ENMT: Mucous membranes are dry Posterior pharynx clear of any exudate or lesions.Normal dentition.  Neck: normal, supple, no masses, no thyromegaly Respiratory: clear to auscultation bilaterally, no wheezing, no crackles. Normal respiratory effort. No accessory muscle use.  Cardiovascular: Regular rate and rhythm, no murmurs / rubs / gallops. No extremity edema. 2+ pedal pulses. No carotid bruits.  Abdomen: Mild focal epigastric tenderness to palpation, no masses palpated. No hepatosplenomegaly. Bowel sounds positive.  Musculoskeletal: no clubbing / cyanosis. No joint deformity upper and lower extremities. Good ROM,  no contractures. Normal muscle tone.  Skin: no rashes, lesions, ulcers. No induration Neurologic: CN 2-12 grossly intact. Sensation intact, DTR normal. Strength 5/5 x all 4 extremities.  Psychiatric: Normal judgment and insight. Alert and oriented x 3. Normal mood.    Labs on Admission: I have personally reviewed following labs and imaging studies  CBC:  Recent Labs Lab 11/24/16 2030  WBC 8.5  HGB 11.4*  HCT 33.6*  MCV 91.1  PLT 979*   Basic Metabolic Panel:  Recent Labs Lab 11/24/16 2030  NA 128*  K 3.6  CL 91*  CO2 25  GLUCOSE 111*  BUN 18  CREATININE 1.28*  CALCIUM 9.9   GFR: Estimated Creatinine Clearance: 17.4 mL/min (A) (by C-G formula based on SCr of 1.28 mg/dL (H)). Liver Function Tests:  Recent Labs Lab 11/24/16 2030  AST 31  ALT 16  ALKPHOS 51  BILITOT 0.5  PROT 6.9  ALBUMIN 4.2    Recent Labs Lab 11/24/16 2030  LIPASE 46   No results for input(s): AMMONIA in the last 168 hours. Coagulation Profile: No results for input(s): INR, PROTIME in the last 168 hours. Cardiac Enzymes: No results for input(s): CKTOTAL, CKMB, CKMBINDEX, TROPONINI in the last 168 hours. BNP (last 3 results) No results for input(s): PROBNP in the last 8760 hours. HbA1C: No results for input(s): HGBA1C in the last 72 hours. CBG: No results for input(s): GLUCAP in the last 168 hours. Lipid Profile: No results for input(s): CHOL, HDL, LDLCALC, TRIG, CHOLHDL, LDLDIRECT in the last 72 hours. Thyroid Function Tests: No results for input(s): TSH, T4TOTAL, FREET4, T3FREE, THYROIDAB in the last 72 hours. Anemia Panel: No results for input(s): VITAMINB12, FOLATE, FERRITIN, TIBC, IRON, RETICCTPCT in the last 72 hours. Urine analysis:    Component Value Date/Time   COLORURINE YELLOW 11/24/2016 2031   APPEARANCEUR CLEAR 11/24/2016 2031   LABSPEC 1.012 11/24/2016 2031   PHURINE 5.0 11/24/2016 2031   GLUCOSEU NEGATIVE 11/24/2016 2031   HGBUR NEGATIVE 11/24/2016 2031    BILIRUBINUR NEGATIVE 11/24/2016 2031   KETONESUR NEGATIVE 11/24/2016 2031   PROTEINUR NEGATIVE 11/24/2016 2031   NITRITE NEGATIVE 11/24/2016 2031   LEUKOCYTESUR SMALL (A) 11/24/2016 2031   Sepsis Labs: @LABRCNTIP (procalcitonin:4,lacticidven:4) ) Recent Results (from the past 240 hour(s))  Urine culture     Status: Abnormal   Collection Time: 11/24/16  8:31 PM  Result Value Ref Range Status   Specimen Description URINE, RANDOM  Final   Special Requests NONE  Final   Culture MULTIPLE SPECIES PRESENT, SUGGEST RECOLLECTION (A)  Final   Report Status 11/26/2016 FINAL  Final     Radiological Exams on Admission: No results found.   Assessment/Plan Principal Problem:   Acute hyponatremia/?? SIADH -Patient presents with generalized weakness ongoing for 2 weeks with  recent treatment for UTI and dehydration. When those labs were obtained patient had mild hyponatremia (sodium 128) - today sodium reported at 121 from M.D. office -Initiate SIADH evaluation: Urine and serum osmolality, random urine sodium -Check TSH and cortisol -Possible component of dehydration so check orthostatic vital signs -If IV fluids indicated please do not initiate until after above laboratory specimens have been collected -Follow electrolytes -If dehydration sodium metabolism could be negatively influenced by underlying CKD -As noted does not drink excessive amounts of water or beer and is not on SSRI or diuretics  Active Problems:   Abdominal pain, epigastric -Onset 2 weeks ago -No emesis -Check H. pylori serologies -Consider adding PPI    Essential hypertension -Current blood pressure suboptimal -Generalized weakness/dizziness could be related to overtreated hypertension so we will stop antihypertensive medications at this juncture (hydralazine)    Chronic kidney disease (CKD), stage III (moderate) -Repeat electrolyte panel now -Renal function from 4/26 showed stability in creatinine and GFR -Patient  has solitary kidney    Anemia -Based on labs from 4/26 hemoglobin was stable at that time -Check anemia panel    Hyperlipidemia with target LDL less than 100 -Check lipid panel since excessive hyperlipemia can contribute to hyponatremia -Continue Zocor    Protein-calorie malnutrition -Patient most recent weight documented at 80 pounds during ER visit on 4/27 this is down from 88 pounds one year prior -Nutrition consultation -Pre-albumin    Pancreatic calcification -Noted on CT of the abdomen performed during recent ER visit -Patient does have epigastric pain so check lipase -On 4/27 lipase was normal      DVT prophylaxis: Lovenox  Code Status: DO NOT RESUSCITATE Family Communication: No family at bedside Disposition Plan: Home Consults called: None     Brown Dunlap L. ANP-BC [facility] Pager [phone]   If 7PM-7AM, please contact night-coverage [email or web address] Password [facility]  12/01/2016, 4:38 PM

## 2016-12-01 NOTE — Progress Notes (Signed)
Patient and patient's daughter at bedside no longer want patient to be a DNR. The patient wants to be a full code in the hospital. Kirby,NP notified of patient's wishes.Will continue to monitor and treat per MD orders.

## 2016-12-02 ENCOUNTER — Other Ambulatory Visit: Payer: Medicare Other

## 2016-12-02 LAB — BASIC METABOLIC PANEL
Anion gap: 9 (ref 5–15)
BUN: 20 mg/dL (ref 6–20)
CALCIUM: 8.6 mg/dL — AB (ref 8.9–10.3)
CO2: 27 mmol/L (ref 22–32)
Chloride: 91 mmol/L — ABNORMAL LOW (ref 101–111)
Creatinine, Ser: 1.21 mg/dL — ABNORMAL HIGH (ref 0.44–1.00)
GFR calc Af Amer: 45 mL/min — ABNORMAL LOW (ref >60)
GFR calc non Af Amer: 39 mL/min — ABNORMAL LOW (ref >60)
GLUCOSE: 88 mg/dL (ref 65–99)
POTASSIUM: 3.8 mmol/L (ref 3.5–5.1)
Sodium: 127 mmol/L — ABNORMAL LOW (ref 135–145)

## 2016-12-02 LAB — LIPID PANEL
Cholesterol: 159 mg/dL (ref 0–200)
HDL: 57 mg/dL (ref >40)
LDL CALC: 88 mg/dL (ref 0–99)
TRIGLYCERIDES: 71 mg/dL (ref <150)
Total CHOL/HDL Ratio: 2.8 RATIO
VLDL: 14 mg/dL (ref 0–40)

## 2016-12-02 LAB — CBC
HEMATOCRIT: 29.3 % — AB (ref 36.0–46.0)
Hemoglobin: 10.2 g/dL — ABNORMAL LOW (ref 12.0–15.0)
MCH: 31.4 pg (ref 26.0–34.0)
MCHC: 34.8 g/dL (ref 30.0–36.0)
MCV: 90.2 fL (ref 78.0–100.0)
Platelets: 403 10*3/uL — ABNORMAL HIGH (ref 150–400)
RBC: 3.25 MIL/uL — ABNORMAL LOW (ref 3.87–5.11)
RDW: 12.9 % (ref 11.5–15.5)
WBC: 7.9 10*3/uL (ref 4.0–10.5)

## 2016-12-02 LAB — H PYLORI, IGM, IGG, IGA AB: H. Pylogi, Iga Abs: <9 units (ref 0.0–8.9)

## 2016-12-02 MED ORDER — ZOLPIDEM TARTRATE 5 MG PO TABS
5.0000 mg | ORAL_TABLET | Freq: Every evening | ORAL | Status: DC | PRN
  Administered 2016-12-02: 5 mg via ORAL
  Filled 2016-12-02: qty 1

## 2016-12-02 MED ORDER — HYDRALAZINE HCL 25 MG PO TABS
25.0000 mg | ORAL_TABLET | Freq: Two times a day (BID) | ORAL | Status: DC
  Administered 2016-12-02 – 2016-12-03 (×3): 25 mg via ORAL
  Filled 2016-12-02 (×3): qty 1

## 2016-12-02 MED ORDER — SODIUM CHLORIDE 0.9 % IV SOLN
INTRAVENOUS | Status: AC
  Administered 2016-12-02: 19:00:00 via INTRAVENOUS

## 2016-12-02 NOTE — Progress Notes (Signed)
PROGRESS NOTE    [name]  DUK:[id number] DOB: 07-Dec-1925 DOA: 12/01/2016 PCP: [name], MD  Brief Narrative:Kristina [name] is a 83 y.o. female with medical history significant for hypertension, stage III chronic kidney disease in setting of solitary kidney, anemia, pancreatic calcification on CT without history of pancreatitis, and dyslipidemia. She also has peripheral vascular disease with claudication symptoms and is followed by cardiovascular physician in the outpatient setting. About 2 weeks ago patient began developing generalized weakness, nausea and dizziness and intermittent epigastric pain, she was seen in the ER and Rx with keflex and tramadol for UTI   Assessment & Plan:  Acute hyponatremia -suspect due to dehydration/volume depletion, poor PO intake, nausea for 1 week prior -Na improving with hydration, urine studies equivocal suspect component of SIADH from nausea/tramadol -continue IVF for 13more hours -TSH and Cortisol unremarkable -bmet in am -OOB, ambulate, PT eval    Abdominal pain, epigastric -Onset 2 weeks ago, now improved -CT abd consistent with pancreatic calcification , without history of pancreatitis, no ETOH use/gall stones etc -tolerating diet today without symptoms -refer back to GI/[facility] for MRCP if epig pain persists    Essential hypertension -Current blood pressure suboptimal -resume hydralazine at lower dose    Chronic kidney disease (CKD), stage III (moderate) -stable at baseline -Patient has solitary kidney    Iron defi Anemia -Based on labs from 4/26 hemoglobin was stable at that time -will not start PO Iron now due to GI symptoms, will need this down the road    Hyperlipidemia with target LDL less than 100 -Continue Zocor    Protein-calorie malnutrition -Patient most recent weight documented at 80 pounds during ER visit on 4/27 this is down from 88 pounds one year prior -Nutrition consultation  DVT prophylaxis: Lovenox   Code Status: DO NOT RESUSCITATE Family Communication: No family at bedside Disposition Plan: Home  Consultants:   None  Subjective: No nausea or pain, feels dizzy and weak  Objective: Vitals:   12/01/16 2230 12/01/16 2258 12/02/16 0500 12/02/16 0501  BP: (!) 164/65 (!) 168/62  (!) 161/68  Pulse: 70 67  67  Resp: 18   18  Temp: 98.2 F (36.8 C)   97.4 F (36.3 C)  TempSrc: Oral   Oral  SpO2: 98%   96%  Weight:   35 kg (77 lb 2.6 oz)     Intake/Output Summary (Last 24 hours) at 12/02/16 1128 Last data filed at 12/02/16 0726  Gross per 24 hour  Intake           973.75 ml  Output             2200 ml  Net         -1226.25 ml   Filed Weights   12/02/16 0500  Weight: 35 kg (77 lb 2.6 oz)    Examination:  General exam: Appears calm and comfortable, frail elderly Respiratory system: Clear to auscultation. Respiratory effort normal. Cardiovascular system: S1 & S2 heard, RRR. No JVD, murmurs, rubs, gallops or clicks. No pedal edema. Gastrointestinal system: Abdomen is nondistended, soft and nontender.Normal bowel sounds heard. Central nervous system: Alert and oriented. No focal neurological deficits. Extremities: Symmetric 5 x 5 power. Skin: No rashes, lesions or ulcers Psychiatry: Judgement and insight appear normal. Mood & affect appropriate.     Data Reviewed:   CBC:  Recent Labs Lab 12/02/16 0424  WBC 7.9  HGB 10.2*  HCT 29.3*  MCV 90.2  PLT 376*   Basic Metabolic  Panel:  Recent Labs Lab 12/01/16 1703 12/02/16 0424  NA 121* 127*  K 4.8 3.8  CL 85* 91*  CO2 26 27  GLUCOSE 95 88  BUN 24* 20  CREATININE 1.24* 1.21*  CALCIUM 9.4 8.6*   GFR: Estimated Creatinine Clearance: 17.8 mL/min (A) (by C-G formula based on SCr of 1.21 mg/dL (H)). Liver Function Tests: No results for input(s): AST, ALT, ALKPHOS, BILITOT, PROT, ALBUMIN in the last 168 hours.  Recent Labs Lab 12/01/16 1703  LIPASE 70*   No results for input(s): AMMONIA in the last 168  hours. Coagulation Profile: No results for input(s): INR, PROTIME in the last 168 hours. Cardiac Enzymes: No results for input(s): CKTOTAL, CKMB, CKMBINDEX, TROPONINI in the last 168 hours. BNP (last 3 results) No results for input(s): PROBNP in the last 8760 hours. HbA1C: No results for input(s): HGBA1C in the last 72 hours. CBG: No results for input(s): GLUCAP in the last 168 hours. Lipid Profile:  Recent Labs  12/02/16 0424  CHOL 159  HDL 57  LDLCALC 88  TRIG 71  CHOLHDL 2.8   Thyroid Function Tests:  Recent Labs  12/01/16 1703  TSH 1.109   Anemia Panel:  Recent Labs  12/01/16 1703  VITAMINB12 217  FOLATE 13.2  FERRITIN 34  TIBC 353  IRON 23*  RETICCTPCT 1.1   Urine analysis:    Component Value Date/Time   COLORURINE YELLOW 12/01/2016 [place] 12/01/2016 1734   LABSPEC 1.009 12/01/2016 1734   PHURINE 5.0 12/01/2016 1734   GLUCOSEU NEGATIVE 12/01/2016 1734   HGBUR NEGATIVE 12/01/2016 1734   BILIRUBINUR NEGATIVE 12/01/2016 1734   KETONESUR NEGATIVE 12/01/2016 1734   PROTEINUR NEGATIVE 12/01/2016 1734   NITRITE NEGATIVE 12/01/2016 1734   LEUKOCYTESUR NEGATIVE 12/01/2016 1734   Sepsis Labs: @LABRCNTIP (procalcitonin:4,lacticidven:4)  ) Recent Results (from the past 240 hour(s))  Urine culture     Status: Abnormal   Collection Time: 11/24/16  8:31 PM  Result Value Ref Range Status   Specimen Description URINE, RANDOM  Final   Special Requests NONE  Final   Culture MULTIPLE SPECIES PRESENT, SUGGEST RECOLLECTION (A)  Final   Report Status 11/26/2016 FINAL  Final         Radiology Studies: No results found.      Scheduled Meds: . aspirin EC  325 mg Oral Daily  . enoxaparin (LOVENOX) injection  40 mg Subcutaneous Q24H  . feeding supplement (ENSURE ENLIVE)  237 mL Oral BID BM  . simvastatin  10 mg Oral QHS  . sodium chloride flush  3 mL Intravenous Q12H   Continuous Infusions: . sodium chloride       LOS: 1 day     Time spent: 63min    Domenic Polite, MD [facility] Pager [phone]  If 7PM-7AM, please contact night-coverage [email or web address] Password [email or web address] 12/02/2016, 11:28 AM

## 2016-12-02 NOTE — Progress Notes (Signed)
Initial Nutrition Assessment  DOCUMENTATION CODES:   Non-severe (moderate) malnutrition in context of acute illness/injury (component of chronic malnutrition as well and may progress to severe based on wt loss trend)  INTERVENTION:  -Smaller, more frequent meals. Plan to order snacks -Continue Ensure Enlive po BID, each supplement provides 350 kcal and 20 grams of protein   NUTRITION DIAGNOSIS:   Malnutrition (Moderate) related to acute illness as evidenced by severe depletion of body fat, mild depletion of muscle mass.  GOAL:   Patient will meet greater than or equal to 90% of their needs  MONITOR:   PO intake, Labs, Weight trends  REASON FOR ASSESSMENT:   Consult Assessment of nutrition requirement/status  ASSESSMENT:    83 yo female admitted with acute hyponatremia with possible SIADH, abdominal pain. Pt with hx of CKD III with solitary kidney, HTN, PAD  CT abdomen consistent with pancreatitic calcification without hx of pancreaitits, no EtOH abuse  Pt reports appetite is fair; ate eggs, biscuit, coffee and OJ for breakfast this AM. Drank Ensure mid morning. Ate less at lunch as still full from Breakfast. Pt reports she typically eats 3 meals per day (B: eggs, toast, OJ, Coffee; L: Ham Sandwich, Chips, Coke, D: varies but typically meat, baked potato/fries, salad). Pt does not typically use nutrition supplements at home.  Pt does report significant wt loss with poor po intake over the past 2 weeks. Reports feeling dizzy, nauseated x 2 weeks, eating minimally with 14 pound wt loss (15% wt loss in 2 weeks). Per weight encounters; 9.4% wt loss in 6 months).  Per wt encounters below, wt has been trending down over the past several years.  Pt reports typically she is very active, drives, takes walks, cooks meals for her friends but has not been able to do that in the past 2 weeks.   Nutrition-Focused physical exam completed. Findings are mild/moderate but severe in  orbital/thoracic area for fat depletion, mild muscle depletion, and no edema.   Wt Readings from Last 10 Encounters:  12/02/16 77 lb 2.6 oz (35 kg)  11/24/16 80 lb (36.3 kg)  06/27/16 85 lb 9.6 oz (38.8 kg)  06/22/15 91 lb (41.3 kg)  06/09/14 93 lb 11.2 oz (42.5 kg)  03/17/14 94 lb 1.6 oz (42.7 kg)  07/11/13 97 lb 4.8 oz (44.1 kg)   Labs: sodium 127 Meds: NS at 50 ml/hr  Diet Order:  Diet regular Room service appropriate? Yes; Fluid consistency: Thin  Skin:  Reviewed, no issues  Last BM:  no documented BM  Height:   Ht Readings from Last 1 Encounters:  11/24/16 4\' 11"  (1.499 m)    Weight:   Wt Readings from Last 1 Encounters:  12/02/16 77 lb 2.6 oz (35 kg)    BMI:  Body mass index is 15.58 kg/m.  Estimated Nutritional Needs:   Kcal:  1100-1400 kcals  Protein:  53-70 g  Fluid:  >/= 1.2 L  EDUCATION NEEDS:   No education needs identified at this time  [place], [place], LDN [phone] Pager  [phone] Weekend/On-Call Pager

## 2016-12-02 NOTE — Progress Notes (Signed)
Patient's BP running 098J systolically and Kirby,NP notified. Kirby,NP returned page and placed order for PRN hydralazine when systolic BP is greater than 170. Will continue to monitor and treat per MD orders.

## 2016-12-02 NOTE — Progress Notes (Signed)
CSW received consult regarding PT recommendation of SNF at discharge.  Patient is refusing SNF. She reports that she would like to return home. Her son lives next door to her and helps her. CSW offered home health, but patient refused, stating that she didn't need any, including equipment.   CSW signing off.   Percell Locus Otha Monical LCSWA [phone]

## 2016-12-02 NOTE — Evaluation (Signed)
Physical Therapy Evaluation Patient Details Name: Kristina [name] MRN: [id number] DOB: 1926-03-04 Today's Date: 12/02/2016   History of Present Illness  Pt is 83 y/o female admitted secondary to hyponatremia, weakness, and nausea. Pt previously in ED on 11/24/2016 for similar symptoms. PMH includes HTN, CKD, PAD, and AAA.   Clinical Impression  Pt admitted secondary to problem above with deficits below. PTA, pt was independent with functional mobility and still driving. For the past week, pt reports she has needed assistance with mobility secondary to weakness and decreased balance. Upon evaluation, pt unsteady and presenting with generalized weakness and dizziness which limited functional mobility participation. Pt requiring min A for steadying during functional mobility and only able to complete short distance to bathroom. Educated about need for SNF, however, pt currently refusing, saying her son is available to help 24/7. Continue to feel pt would benefit from short term SNF to increase strength and independence with functional mobility. However, if pt refuses, will need 24 hr assist and HHPT along with 3 in 1. Will continue to follow to progress mobility according to pt tolerance.     Follow Up Recommendations SNF;Supervision/Assistance - 24 hour    Equipment Recommendations  3in1 (PT)    Recommendations for Other Services OT consult     Precautions / Restrictions Precautions Precautions: Fall Restrictions Weight Bearing Restrictions: No      Mobility  Bed Mobility Overal bed mobility: Needs Assistance Bed Mobility: Supine to Sit;Sit to Supine     Supine to sit: Min assist;HOB elevated Sit to supine: Min guard   General bed mobility comments: Min A for trunk elevation.   Transfers Overall transfer level: Needs assistance Equipment used: 1 person hand held assist Transfers: Sit to/from Stand Sit to Stand: Min assist         General transfer comment: Min A for lift  assist and for steadying once standing. Pt with slight posterior lean and relied on bed for stabilization in standing.   Ambulation/Gait Ambulation/Gait assistance: Min assist Ambulation Distance (Feet): 10 Feet Assistive device: 1 person hand held assist;Rolling walker (2 wheeled) Gait Pattern/deviations: Step-through pattern;Decreased stride length;Shuffle;Narrow base of support;Trunk flexed Gait velocity: Decreased Gait velocity interpretation: Below normal speed for age/gender General Gait Details: Slow, unsteady gait. Pt initially refusing use of RW, and requested use of HHA. Pt unsteady with gait requiring HHA and min A to prevent LOB. Pt agreeable to use of RW after 5' of ambulation to the bathroom, and with RW increased steadiness, however, continued to require min A to maintain balance. Verbal cues for proximity to device and upright posture. Distance limited by fatigue and dizziness.   Stairs            Wheelchair Mobility    Modified Rankin (Stroke Patients Only)       Balance Overall balance assessment: Needs assistance Sitting-balance support: No upper extremity supported;Feet supported Sitting balance-Leahy Scale: Fair     Standing balance support: Bilateral upper extremity supported;During functional activity Standing balance-Leahy Scale: Poor Standing balance comment: Reliant on UE support and min A to maintain balance.                              Pertinent Vitals/Pain Pain Assessment: No/denies pain    Home Living Family/patient expects to be discharged to:: Private residence Living Arrangements: Alone Available Help at Discharge: Family;Available 24 hours/day Type of Home: House Home Access: Level entry     Home  Layout: One level Home Equipment: Walker - 2 wheels Additional Comments: Son lives behind and would be able to stay with her 24/7     Prior Function Level of Independence: Independent         Comments: Pt reports she was  independent and still driving until the last week or so. Pt reports she has needed help walking around for the past week since she has been so weak.      Hand Dominance   Dominant Hand: Right    Extremity/Trunk Assessment   Upper Extremity Assessment Upper Extremity Assessment: Generalized weakness    Lower Extremity Assessment Lower Extremity Assessment: Generalized weakness (Grossly 3+/5 throughout BLE)    Cervical / Trunk Assessment Cervical / Trunk Assessment: Kyphotic  Communication   Communication: No difficulties  Cognition Arousal/Alertness: Awake/alert Behavior During Therapy: WFL for tasks assessed/performed Overall Cognitive Status: Within Functional Limits for tasks assessed                                        General Comments General comments (skin integrity, edema, etc.): Pt reporting dizziness throughout unrelated to position or head turning change. Orthostatics checked and WNL; see vitals flowsheet. Pt educated about current deficits and need for SNF at d/c, however, pt not agreeable. Educated to think about it and explained benefits of rehab. Will follow up at next session.      Exercises     Assessment/Plan    PT Assessment Patient needs continued PT services  PT Problem List Decreased strength;Decreased activity tolerance;Decreased balance;Decreased mobility;Decreased knowledge of use of DME;Decreased safety awareness       PT Treatment Interventions DME instruction;Gait training;Functional mobility training;Therapeutic activities;Therapeutic exercise;Balance training;Neuromuscular re-education;Patient/family education    PT Goals (Current goals can be found in the Care Plan section)  Acute Rehab PT Goals Patient Stated Goal: to go home and get stronger PT Goal Formulation: With patient Time For Goal Achievement: 12/16/16 Potential to Achieve Goals: Good    Frequency Min 3X/week   Barriers to discharge Decreased caregiver  support Lives alone     Co-evaluation               AM-PAC PT "6 Clicks" Daily Activity  Outcome Measure Difficulty turning over in bed (including adjusting bedclothes, sheets and blankets)?: A Lot Difficulty moving from lying on back to sitting on the side of the bed? : Total Difficulty sitting down on and standing up from a chair with arms (e.g., wheelchair, bedside commode, etc,.)?: Total Help needed moving to and from a bed to chair (including a wheelchair)?: A Little Help needed walking in hospital room?: A Little Help needed climbing 3-5 steps with a railing? : A Lot 6 Click Score: 12    End of Session Equipment Utilized During Treatment: Gait belt Activity Tolerance: Treatment limited secondary to medical complications (Comment);Patient limited by fatigue (dizziness) Patient left: in bed;with call bell/phone within reach;with bed alarm set Nurse Communication: Mobility status PT Visit Diagnosis: Unsteadiness on feet (R26.81);Muscle weakness (generalized) (M62.81)    Time: 1610-9604 PT Time Calculation (min) (ACUTE ONLY): 25 min   Charges:   PT Evaluation $PT Eval Moderate Complexity: 1 Procedure PT Treatments $Gait Training: 8-22 mins   PT G Codes:        Nicky Pugh, PT, DPT  Acute Rehabilitation Services  Pager: [phone]   [place] Melia 12/02/2016, 2:27 PM

## 2016-12-03 DIAGNOSIS — E440 Moderate protein-calorie malnutrition: Secondary | ICD-10-CM | POA: Insufficient documentation

## 2016-12-03 LAB — BASIC METABOLIC PANEL
Anion gap: 9 (ref 5–15)
BUN: 18 mg/dL (ref 6–20)
CHLORIDE: 92 mmol/L — AB (ref 101–111)
CO2: 28 mmol/L (ref 22–32)
Calcium: 9.4 mg/dL (ref 8.9–10.3)
Creatinine, Ser: 1.17 mg/dL — ABNORMAL HIGH (ref 0.44–1.00)
GFR calc non Af Amer: 40 mL/min — ABNORMAL LOW (ref >60)
GFR, EST AFRICAN AMERICAN: 47 mL/min — AB (ref >60)
Glucose, Bld: 142 mg/dL — ABNORMAL HIGH (ref 65–99)
POTASSIUM: 3.4 mmol/L — AB (ref 3.5–5.1)
SODIUM: 129 mmol/L — AB (ref 135–145)

## 2016-12-03 LAB — CBC
HEMATOCRIT: 34.9 % — AB (ref 36.0–46.0)
HEMOGLOBIN: 11.4 g/dL — AB (ref 12.0–15.0)
MCH: 30.3 pg (ref 26.0–34.0)
MCHC: 32.7 g/dL (ref 30.0–36.0)
MCV: 92.8 fL (ref 78.0–100.0)
Platelets: 449 10*3/uL — ABNORMAL HIGH (ref 150–400)
RBC: 3.76 MIL/uL — AB (ref 3.87–5.11)
RDW: 12.9 % (ref 11.5–15.5)
WBC: 9.3 10*3/uL (ref 4.0–10.5)

## 2016-12-03 MED ORDER — HYDRALAZINE HCL 25 MG PO TABS: 25.0000 mg | ORAL_TABLET | Freq: Two times a day (BID) | ORAL | 0 refills | Status: DC

## 2016-12-03 NOTE — Discharge Summary (Signed)
Physician Discharge Summary  Kristina [name] ZOX:[id number] DOB: January 29, 1926 DOA: 12/01/2016  PCP: [name], MD  Admit date: 12/01/2016 Discharge date: 12/03/2016  Time spent: 35 minutes  Recommendations for Outpatient Follow-up:  1. PCP Dr.MacKenzie in 1 week, Hydralazine dose decreased, please check Bmet in 1 week 2. Gi Dr.Mann in 2-3weeks for Chronic pancreatitis of unclear etiology   Discharge Diagnoses:  Principal Problem:   Acute hyponatremia Active Problems:   Essential hypertension   Hyperlipidemia with target LDL less than 100   Chronic kidney disease (CKD), stage III (moderate)   Protein-calorie malnutrition (HCC)   Anemia   Pancreatic calcification   Abdominal pain, epigastric   Hyponatremia   Malnutrition of moderate degree   Discharge Condition: stable  Diet recommendation: heart healthy diet  Filed Weights   12/02/16 0500  Weight: 35 kg (77 lb 2.6 oz)    History of present illness:  Kristina [name] a 83 y.o.femalewith medical history significant for hypertension, stage III chronic kidney disease in setting of solitary kidney, anemia, pancreatic calcification on CT without history of pancreatitis, and dyslipidemia.  About 2 weeks ago patient began developing generalized weakness, nausea and dizziness and intermittent epigastric pain, she was seen in the ER and Rx with keflex and tramadol for UTI. Seen in office by Dr.Mann and referred for admission due to hyponatremia  Hospital Course:  Acute hyponatremia -due to dehydration/volume depletion, poor PO intake, nausea for 1 week prior -Na improved with hydration, urine studies equivocal suspected component of SIADH from nausea/tramadol -with hydration Na improved from 121 on admission to 129 at discharge -TSH and Cortisol unremarkable -Ambulated, PT eval completed SNF recommended but pt declined this and hence set up with Home PT   Abdominal pain, epigastric -Onset 2 weeks ago, now improved, no  symptoms this admission -CT abd consistent with pancreatic calcification , without history of pancreatitis, no ETOH use/ no gall stones etc -tolerating diet without symptoms -refer back to GI/Mann for MRCP if epig pain persists  Essential hypertension -stable to low normal range -resumed hydralazine at lower dose due to dizziness  Chronic kidney disease (CKD), stage III (moderate) -stable at baseline -Patient has solitary kidney  Iron defi Anemia -Based on labs from 4/26 hemoglobin was stable at that time -will not start PO Iron now due to GI symptoms, will need this down the road  Hyperlipidemia with target LDL less than 100 -Continue Zocor  Protein-calorie malnutrition -Patient most recentweight documented at 80 pounds during ER visit on 4/27 this is down from 88 pounds one year prior -Nutrition consultation completed, advised protein supplements BID  Discharge Exam: Vitals:   12/03/16 0535 12/03/16 0924  BP: (!) 143/56 (!) 184/72  Pulse: 66   Resp: 18   Temp: 97.5 F (36.4 C)     General: AAOx3 Cardiovascular: S1S2/RRR Respiratory: CTAB  Discharge Instructions   Discharge Instructions    Diet - low sodium heart healthy    Complete by:  As directed    Increase activity slowly    Complete by:  As directed      Current Discharge Medication List    CONTINUE these medications which have CHANGED   Details  hydrALAZINE (APRESOLINE) 25 MG tablet Take 1 tablet (25 mg total) by mouth 2 (two) times daily. Qty: 30 tablet, Refills: 0      CONTINUE these medications which have NOT CHANGED   Details  aspirin 325 MG tablet Take 325 mg by mouth daily.    Cholecalciferol (VITAMIN D-3  PO) Take 1 tablet by mouth once a week.     HYDROcodone-acetaminophen (NORCO/VICODIN) 5-325 MG per tablet Take 1 tablet by mouth every 6 (six) hours as needed for moderate pain.    Polyethylene Glycol 3350 (MIRALAX PO) Take 1 packet by mouth daily as needed (constipation).      Probiotic Product (ALIGN) 4 MG CAPS Take by mouth daily.    simvastatin (ZOCOR) 10 MG tablet Take 1 tablet (10 mg total) by mouth at bedtime. Qty: 30 tablet, Refills: 11    traMADol (ULTRAM) 50 MG tablet Take 0.5 tablets (25 mg total) by mouth every 6 (six) hours as needed for moderate pain. Qty: 15 tablet, Refills: 0      STOP taking these medications     cephALEXin (KEFLEX) 500 MG capsule      Oxycodone HCl 10 MG TABS        Allergies  Allergen Reactions  . Lipitor [Atorvastatin]   . Pletal [Cilostazol]    Follow-up Information    Juanita Craver, MD. Schedule an appointment as soon as possible for a visit in 2 week(s).   Specialty:  Gastroenterology Why:  for Chronic Pancreatitis Contact information: [address], [place] [address] [phone]            The results of significant diagnostics from this hospitalization (including imaging, microbiology, ancillary and laboratory) are listed below for reference.    Significant Diagnostic Studies: Ct Abdomen Pelvis W Contrast  Result Date: 11/25/2016 CLINICAL DATA:  83 year old female with generalized abdominal pain. EXAM: CT ABDOMEN AND PELVIS WITH CONTRAST TECHNIQUE: Multidetector CT imaging of the abdomen and pelvis was performed using the standard protocol following bolus administration of intravenous contrast. CONTRAST:  12mL ISOVUE-300 IOPAMIDOL (ISOVUE-300) INJECTION 61% COMPARISON:  Abdominal ultrasound dated 11/24/2016 FINDINGS: Lower chest: There is mild emphysematous changes of the visualized lung. The visualized lung bases are otherwise clear. Multi vessel coronary vascular calcification. There is no intra-abdominal free air or free fluid. Hepatobiliary: No focal liver abnormality is seen. No gallstones, gallbladder wall thickening, or biliary dilatation. Pancreas: There is scattered coarse calcification of the pancreas most consistent with sequela of chronic pancreatitis. No evidence of  acute pancreatitis. Correlation with pancreatic enzymes recommended. There is no dilatation of the main pancreatic duct. No peripancreatic fluid collection. Spleen: Normal in size without focal abnormality. Adrenals/Urinary Tract: Left adrenal thickening or nodularity measuring up to 10 mm and may be related to underlying adenoma. The right adrenal gland appears unremarkable. The left kidney is atrophic. The right kidney appears unremarkable with homogeneous enhancement. Subcentimeter right renal inferior pole hypodense lesion is not well characterized but likely represents a cyst. There is no hydronephrosis. The visualized ureters and urinary bladder appear unremarkable. Stomach/Bowel: There is severe sigmoid diverticulosis without active inflammatory changes. There is no evidence of bowel obstruction or active inflammation. Normal appendix. Vascular/Lymphatic: There is advanced aortoiliac atherosclerotic disease. There is a fusiform infrarenal abdominal aortic aneurysm measuring up to 3.6 cm in transverse diameter and approximately 4 cm in length. The IVC appears unremarkable. No portal venous gas identified. There is no adenopathy. Reproductive: Probable small calcified uterine granuloma. The ovaries are grossly unremarkable as visualized. Other: None Musculoskeletal: Osteopenia with degenerative changes of the spine. Grade 1 L3-L4 and L4-L5 anterolisthesis. L2-L3 disc desiccation with vacuum phenomena. No acute fracture. IMPRESSION: 1. No acute intra-abdominopelvic pathology. No evidence of bowel obstruction or active inflammation. Normal appendix. 2. Extensive sigmoid diverticulosis. 3. Scattered pancreatic calcifications sequela of chronic pancreatitis. Correlation  with pancreatic enzymes recommended to exclude acute on chronic pancreatitis. 4. Atrophic left kidney.  Unremarkable right kidney. 5. A 1 cm left adrenal indeterminate nodule, possibly an adenoma. 6. Advanced aortic atherosclerosis with a 3.6 cm  fusiform infrarenal abdominal aortic aneurysm. Recommend followup by ultrasound in 2 years. This recommendation follows ACR consensus guidelines: White Paper of the ACR Incidental Findings Committee II on Vascular Findings. J Am Coll Radiol 2013; 10:789-794. Electronically Signed   By: Anner Crete M.D.   On: 11/25/2016 02:52   US Abdomen Limited Ruq  Result Date: 11/25/2016 CLINICAL DATA:  Abdominal pain for 1 week EXAM: US ABDOMEN LIMITED - RIGHT UPPER QUADRANT COMPARISON:  None. FINDINGS: Gallbladder: No shadowing stones. Gallbladder polyp measuring up to 3 mm. Wall thickness within normal limits. Negative sonographic Murphy Common bile duct: Diameter: 3.7 mm Liver: No focal lesion identified. Within normal limits in parenchymal echogenicity. IMPRESSION: 1. Gallbladder polyp measuring up to 3 mm. No sonographic evidence for acute cholecystitis. 2. No biliary dilatation Electronically Signed   By: Donavan Foil M.D.   On: 11/25/2016 00:14    Microbiology: Recent Results (from the past 240 hour(s))  Urine culture     Status: Abnormal   Collection Time: 11/24/16  8:31 PM  Result Value Ref Range Status   Specimen Description URINE, RANDOM  Final   Special Requests NONE  Final   Culture MULTIPLE SPECIES PRESENT, SUGGEST RECOLLECTION (A)  Final   Report Status 11/26/2016 FINAL  Final     Labs: Basic Metabolic Panel:  Recent Labs Lab 12/01/16 1703 12/02/16 0424  NA 121* 127*  K 4.8 3.8  CL 85* 91*  CO2 26 27  GLUCOSE 95 88  BUN 24* 20  CREATININE 1.24* 1.21*  CALCIUM 9.4 8.6*   Liver Function Tests: No results for input(s): AST, ALT, ALKPHOS, BILITOT, PROT, ALBUMIN in the last 168 hours.  Recent Labs Lab 12/01/16 1703  LIPASE 70*   No results for input(s): AMMONIA in the last 168 hours. CBC:  Recent Labs Lab 12/02/16 0424  WBC 7.9  HGB 10.2*  HCT 29.3*  MCV 90.2  PLT 403*   Cardiac Enzymes: No results for input(s): CKTOTAL, CKMB, CKMBINDEX, TROPONINI in the last  168 hours. BNP: BNP (last 3 results) No results for input(s): BNP in the last 8760 hours.  ProBNP (last 3 results) No results for input(s): PROBNP in the last 8760 hours.  CBG: No results for input(s): GLUCAP in the last 168 hours.     SignedDomenic Polite MD.  [facility] 12/03/2016, 10:09 AM

## 2016-12-03 NOTE — Progress Notes (Signed)
Vickki Hearing to be D/C'd Home per MD order.  Discussed with the patient and all questions fully answered.  VSS, IV catheter discontinued intact. Site without signs and symptoms of complications. Dressing and pressure applied.  An After Visit Summary was printed and given to the patient. Patient received prescription.  D/c education completed with patient/family including follow up instructions, medication list, d/c activities limitations if indicated, with other d/c instructions as indicated by MD - patient able to verbalize understanding, all questions fully answered.   Patient instructed to return to ED, call 911, or call MD for any changes in condition.   Patient escorted via [place], and D/C home via private auto.  [place] 12/03/2016 12:58 PM

## 2016-12-05 ENCOUNTER — Other Ambulatory Visit: Payer: Medicare Other

## 2016-12-05 NOTE — Consult Note (Signed)
           [facility] CM Primary Care Navigator  12/05/2016  SHATASIA CUTSHAW 1926-02-15 [id number]   Jeryl [place] to see patient at the bedside to identify possible discharge needs but she was alreadydischarged.  Patient was discharged home with home health services vs.skilled nursing facility(declined) this weekend.  Primary care provider's office called (Jessica)to notify of patient's discharge and need for post hospital follow-up and transition of care. Notified also of patient's health needs/issues needing follow-up.   Made aware to refer patient to [facility] care management if deemed appropriate for services.      For questions, please contact:  Dannielle Huh, BSN, RN- [facility] Primary Care Navigator  Telephone: [phone] [place]

## 2016-12-09 ENCOUNTER — Inpatient Hospital Stay (HOSPITAL_COMMUNITY): Payer: Medicare Other

## 2016-12-09 ENCOUNTER — Inpatient Hospital Stay (HOSPITAL_COMMUNITY)
Admission: AD | Admit: 2016-12-09 | Discharge: 2016-12-12 | DRG: 644 | Disposition: A | Discharge status: Home / Self Care | Payer: Medicare Other | Source: Ambulatory Visit | Attending: Family Medicine | Admitting: Family Medicine

## 2016-12-09 ENCOUNTER — Encounter (HOSPITAL_COMMUNITY): Payer: Self-pay | Admitting: Internal Medicine

## 2016-12-09 DIAGNOSIS — I70209 Unspecified atherosclerosis of native arteries of extremities, unspecified extremity: Secondary | ICD-10-CM | POA: Diagnosis present

## 2016-12-09 DIAGNOSIS — Z7982 Long term (current) use of aspirin: Secondary | ICD-10-CM

## 2016-12-09 DIAGNOSIS — N183 Chronic kidney disease, stage 3 unspecified: Secondary | ICD-10-CM | POA: Diagnosis present

## 2016-12-09 DIAGNOSIS — I714 Abdominal aortic aneurysm, without rupture, unspecified: Secondary | ICD-10-CM | POA: Diagnosis present

## 2016-12-09 DIAGNOSIS — E222 Syndrome of inappropriate secretion of antidiuretic hormone: Principal | ICD-10-CM | POA: Diagnosis present

## 2016-12-09 DIAGNOSIS — Z681 Body mass index (BMI) 19 or less, adult: Secondary | ICD-10-CM | POA: Diagnosis not present

## 2016-12-09 DIAGNOSIS — D509 Iron deficiency anemia, unspecified: Secondary | ICD-10-CM | POA: Diagnosis present

## 2016-12-09 DIAGNOSIS — E440 Moderate protein-calorie malnutrition: Secondary | ICD-10-CM | POA: Diagnosis not present

## 2016-12-09 DIAGNOSIS — R531 Weakness: Secondary | ICD-10-CM

## 2016-12-09 DIAGNOSIS — E46 Unspecified protein-calorie malnutrition: Secondary | ICD-10-CM | POA: Diagnosis present

## 2016-12-09 DIAGNOSIS — E871 Hypo-osmolality and hyponatremia: Secondary | ICD-10-CM | POA: Diagnosis not present

## 2016-12-09 DIAGNOSIS — K8689 Other specified diseases of pancreas: Secondary | ICD-10-CM | POA: Diagnosis present

## 2016-12-09 DIAGNOSIS — N179 Acute kidney failure, unspecified: Secondary | ICD-10-CM | POA: Diagnosis present

## 2016-12-09 DIAGNOSIS — Z888 Allergy status to other drugs, medicaments and biological substances status: Secondary | ICD-10-CM | POA: Diagnosis not present

## 2016-12-09 DIAGNOSIS — I10 Essential (primary) hypertension: Secondary | ICD-10-CM | POA: Diagnosis present

## 2016-12-09 DIAGNOSIS — H9190 Unspecified hearing loss, unspecified ear: Secondary | ICD-10-CM | POA: Diagnosis present

## 2016-12-09 DIAGNOSIS — Z87891 Personal history of nicotine dependence: Secondary | ICD-10-CM | POA: Diagnosis not present

## 2016-12-09 DIAGNOSIS — R42 Dizziness and giddiness: Secondary | ICD-10-CM | POA: Diagnosis present

## 2016-12-09 DIAGNOSIS — E785 Hyperlipidemia, unspecified: Secondary | ICD-10-CM | POA: Diagnosis present

## 2016-12-09 DIAGNOSIS — Z79899 Other long term (current) drug therapy: Secondary | ICD-10-CM

## 2016-12-09 DIAGNOSIS — C259 Malignant neoplasm of pancreas, unspecified: Secondary | ICD-10-CM

## 2016-12-09 DIAGNOSIS — I129 Hypertensive chronic kidney disease with stage 1 through stage 4 chronic kidney disease, or unspecified chronic kidney disease: Secondary | ICD-10-CM | POA: Diagnosis present

## 2016-12-09 DIAGNOSIS — D649 Anemia, unspecified: Secondary | ICD-10-CM | POA: Diagnosis present

## 2016-12-09 DIAGNOSIS — I70219 Atherosclerosis of native arteries of extremities with intermittent claudication, unspecified extremity: Secondary | ICD-10-CM | POA: Diagnosis present

## 2016-12-09 LAB — CBC WITH DIFFERENTIAL/PLATELET
Basophils Absolute: 0 10*3/uL (ref 0.0–0.1)
Basophils Relative: 0 %
EOS PCT: 1 %
Eosinophils Absolute: 0.1 10*3/uL (ref 0.0–0.7)
HCT: 31.7 % — ABNORMAL LOW (ref 36.0–46.0)
Hemoglobin: 10.7 g/dL — ABNORMAL LOW (ref 12.0–15.0)
LYMPHS PCT: 10 %
Lymphs Abs: 1.1 10*3/uL (ref 0.7–4.0)
MCH: 30.7 pg (ref 26.0–34.0)
MCHC: 33.8 g/dL (ref 30.0–36.0)
MCV: 91.1 fL (ref 78.0–100.0)
MONO ABS: 1.1 10*3/uL — AB (ref 0.1–1.0)
Monocytes Relative: 10 %
Neutro Abs: 8.6 10*3/uL — ABNORMAL HIGH (ref 1.7–7.7)
Neutrophils Relative %: 79 %
PLATELETS: 475 10*3/uL — AB (ref 150–400)
RBC: 3.48 MIL/uL — AB (ref 3.87–5.11)
RDW: 12.9 % (ref 11.5–15.5)
WBC: 10.9 10*3/uL — AB (ref 4.0–10.5)

## 2016-12-09 LAB — URINALYSIS, ROUTINE W REFLEX MICROSCOPIC
BILIRUBIN URINE: NEGATIVE
Bacteria, UA: NONE SEEN
GLUCOSE, UA: NEGATIVE mg/dL
HGB URINE DIPSTICK: NEGATIVE
Ketones, ur: NEGATIVE mg/dL
NITRITE: NEGATIVE
PROTEIN: NEGATIVE mg/dL
Specific Gravity, Urine: 1.011 (ref 1.005–1.030)
Squamous Epithelial / LPF: NONE SEEN
pH: 5 (ref 5.0–8.0)

## 2016-12-09 LAB — COMPREHENSIVE METABOLIC PANEL
ALT: 16 U/L (ref 14–54)
AST: 22 U/L (ref 15–41)
Albumin: 4 g/dL (ref 3.5–5.0)
Alkaline Phosphatase: 58 U/L (ref 38–126)
Anion gap: 10 (ref 5–15)
BUN: 43 mg/dL — AB (ref 6–20)
CALCIUM: 9.4 mg/dL (ref 8.9–10.3)
CHLORIDE: 90 mmol/L — AB (ref 101–111)
CO2: 26 mmol/L (ref 22–32)
CREATININE: 1.14 mg/dL — AB (ref 0.44–1.00)
GFR calc Af Amer: 48 mL/min — ABNORMAL LOW (ref >60)
GFR calc non Af Amer: 42 mL/min — ABNORMAL LOW (ref >60)
GLUCOSE: 98 mg/dL (ref 65–99)
Potassium: 4.5 mmol/L (ref 3.5–5.1)
SODIUM: 126 mmol/L — AB (ref 135–145)
Total Bilirubin: 0.2 mg/dL — ABNORMAL LOW (ref 0.3–1.2)
Total Protein: 7.4 g/dL (ref 6.5–8.1)

## 2016-12-09 LAB — CREATININE, URINE, RANDOM: Creatinine, Urine: 50.3 mg/dL

## 2016-12-09 LAB — CORTISOL: Cortisol, Plasma: 13.6 ug/dL

## 2016-12-09 LAB — APTT: aPTT: 32 seconds (ref 24–36)

## 2016-12-09 LAB — PROTIME-INR
INR: 0.89
Prothrombin Time: 12 seconds (ref 11.4–15.2)

## 2016-12-09 LAB — MAGNESIUM: Magnesium: 1.9 mg/dL (ref 1.7–2.4)

## 2016-12-09 LAB — SODIUM, URINE, RANDOM: Sodium, Ur: 45 mmol/L

## 2016-12-09 MED ORDER — SODIUM CHLORIDE 0.9% FLUSH
3.0000 mL | Freq: Two times a day (BID) | INTRAVENOUS | Status: DC
  Administered 2016-12-11 – 2016-12-12 (×3): 3 mL via INTRAVENOUS

## 2016-12-09 MED ORDER — HEPARIN SODIUM (PORCINE) 5000 UNIT/ML IJ SOLN
5000.0000 [IU] | Freq: Three times a day (TID) | INTRAMUSCULAR | Status: DC
  Administered 2016-12-09 – 2016-12-12 (×8): 5000 [IU] via SUBCUTANEOUS
  Filled 2016-12-09 (×8): qty 1

## 2016-12-09 MED ORDER — SIMVASTATIN 10 MG PO TABS
10.0000 mg | ORAL_TABLET | Freq: Every day | ORAL | Status: DC
  Administered 2016-12-09 – 2016-12-11 (×3): 10 mg via ORAL
  Filled 2016-12-09 (×3): qty 1

## 2016-12-09 MED ORDER — ACETAMINOPHEN 650 MG RE SUPP: 650.0000 mg | Freq: Four times a day (QID) | RECTAL | Status: DC | PRN

## 2016-12-09 MED ORDER — SODIUM CHLORIDE 0.9 % IV SOLN: 250.0000 mL | INTRAVENOUS | Status: DC | PRN

## 2016-12-09 MED ORDER — ENSURE ENLIVE PO LIQD
237.0000 mL | Freq: Two times a day (BID) | ORAL | Status: DC
  Administered 2016-12-10 – 2016-12-11 (×2): 237 mL via ORAL

## 2016-12-09 MED ORDER — ONDANSETRON HCL 4 MG PO TABS: 4.0000 mg | ORAL_TABLET | Freq: Four times a day (QID) | ORAL | Status: DC | PRN

## 2016-12-09 MED ORDER — CLONIDINE HCL 0.1 MG PO TABS
0.1000 mg | ORAL_TABLET | Freq: Once | ORAL | Status: AC
  Administered 2016-12-09: 0.1 mg via ORAL
  Filled 2016-12-09: qty 1

## 2016-12-09 MED ORDER — SODIUM CHLORIDE 0.9% FLUSH: 3.0000 mL | INTRAVENOUS | Status: DC | PRN

## 2016-12-09 MED ORDER — ASPIRIN EC 325 MG PO TBEC
325.0000 mg | DELAYED_RELEASE_TABLET | Freq: Every day | ORAL | Status: DC
  Administered 2016-12-09 – 2016-12-11 (×3): 325 mg via ORAL
  Filled 2016-12-09 (×3): qty 1

## 2016-12-09 MED ORDER — POLYETHYLENE GLYCOL 3350 17 G PO PACK
17.0000 g | PACK | Freq: Every day | ORAL | Status: DC | PRN
  Administered 2016-12-10 – 2016-12-12 (×3): 17 g via ORAL

## 2016-12-09 MED ORDER — IPRATROPIUM BROMIDE 0.02 % IN SOLN: 0.5000 mg | RESPIRATORY_TRACT | Status: DC | PRN

## 2016-12-09 MED ORDER — ACETAMINOPHEN 325 MG PO TABS: 650.0000 mg | ORAL_TABLET | Freq: Four times a day (QID) | ORAL | Status: DC | PRN

## 2016-12-09 MED ORDER — SORBITOL 70 % SOLN
30.0000 mL | Freq: Every day | Status: DC | PRN
  Administered 2016-12-10 – 2016-12-12 (×3): 30 mL via ORAL
  Filled 2016-12-09 (×3): qty 30

## 2016-12-09 MED ORDER — RISAQUAD PO CAPS
1.0000 | ORAL_CAPSULE | Freq: Every day | ORAL | Status: DC
  Administered 2016-12-09 – 2016-12-11 (×3): 1 via ORAL
  Filled 2016-12-09 (×3): qty 1

## 2016-12-09 MED ORDER — HYDROCODONE-ACETAMINOPHEN 5-325 MG PO TABS: 1.0000 | ORAL_TABLET | Freq: Four times a day (QID) | ORAL | Status: DC | PRN

## 2016-12-09 MED ORDER — POLYETHYLENE GLYCOL 3350 17 GM/SCOOP PO POWD: 1.0000 | Freq: Every day | ORAL | Status: DC | PRN

## 2016-12-09 MED ORDER — ONDANSETRON HCL 4 MG/2ML IJ SOLN
4.0000 mg | Freq: Four times a day (QID) | INTRAMUSCULAR | Status: DC | PRN
  Administered 2016-12-10 – 2016-12-11 (×2): 4 mg via INTRAVENOUS
  Filled 2016-12-09 (×2): qty 2

## 2016-12-09 MED ORDER — HYDRALAZINE HCL 25 MG PO TABS
25.0000 mg | ORAL_TABLET | Freq: Two times a day (BID) | ORAL | Status: DC
  Administered 2016-12-09 – 2016-12-12 (×6): 25 mg via ORAL
  Filled 2016-12-09 (×6): qty 1

## 2016-12-09 MED ORDER — SODIUM CHLORIDE 0.9% FLUSH
3.0000 mL | Freq: Two times a day (BID) | INTRAVENOUS | Status: DC
  Administered 2016-12-09 – 2016-12-10 (×2): 3 mL via INTRAVENOUS

## 2016-12-09 MED ORDER — FLEET ENEMA 7-19 GM/118ML RE ENEM: 1.0000 | ENEMA | Freq: Once | RECTAL | Status: DC | PRN

## 2016-12-09 MED ORDER — POLYETHYLENE GLYCOL 3350 17 G PO PACK
17.0000 g | PACK | Freq: Every day | ORAL | Status: DC | PRN
Start: 2016-12-09 — End: 2016-12-12
  Filled 2016-12-09 (×3): qty 1

## 2016-12-09 NOTE — H&P (Signed)
History and Physical    Kristina [name] ZOX:[id number] DOB: [birth year] DOA: 12/09/2016  PCP: [name], MD  Patient coming from: Home via Dr. Lorie Apley office   Chief Complaint: Dizziness/hyponatremia  HPI: Kristina [name] is a 83 y.o. female with medical history significant of chronic kidney disease stage III with solitary kidney and 1 functional kidney due to renal occlusion on the right, hyperlipidemia, hypertension, peripheral artery disease, AAA who was recently hospitalized from 12/01/2016-12/03/2016 for acute hyponatremia Felt secondary to hypovolemic hyponatremia secondary to poor oral intake and volume depletion and concern for possible SIADH. Patient presented to Dr. Lorie Apley office with generalized weakness and dizziness and was noted to have had lab work done at her nephrologist office 3 days prior to admission and noted to have a sodium level of 120 at that time. Patient presented back to her nephrologist office the day of admission for repeat labs of her labs were pending at the time of admission. Patient went to Dr. Lorie Apley office where patient was noted to have stable vital signs however with her generalized weakness and recent sodium levels of 12o, [facility] were called to admit the patient for further evaluation and management of her hyponatremia. Patient states appetite improved since last admission. Patient denies any fevers, no chills, no nausea, no vomiting, no abdominal pain, no chest pain, no shortness of breath, no dysuria, no diarrhea, probable constipation, no hematochezia, no melena, no hematemesis, no cough, no asymmetric weakness or numbness, no slurred speech. Patient denied any use of SSRIs or diuretics. Patient states on last admission her hydralazine dose was decreased. No other changes in medication.   ED Course: None. Patient was a direct admission from Dr. Lorie Apley office.   Review of Systems: As per HPI otherwise 10 point review of systems negative.    Past Medical History:  Diagnosis Date  . AAA (abdominal aortic aneurysm) (HCC)    Mild - measuring 2.8x2.8cm  . Acute hyponatremia 11/2016  . 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 HOH (hard of hearing)   . Hyperlipidemia    Well-controlled  . Hypertension   . PAD (peripheral artery disease) (HCC)    LEA Dopplers: Right external iliac 40%, right mid SFA 70-99% with occlusion in Hunter's canal. Angiography. LEFT common iliac 60%, left SFA 70-9%, popliteal 50%; RIGHT posterior tibial occluded;; carotid Dopplers in 2009: Less than 50% stenosis.  . Renal artery stenosis [facility])     Past Surgical History:  Procedure Laterality Date  . LOWER EXTREMITY ARTERIAL DOPPLER  05/30/2011   Right EIA->50% daimeter reduction, Rt SFA 70-99% diameter reduction, Rt SFA-occlusive disease at Hunter's canal, Rt PTA-appeared occluded, Lft CIA-moderate amount of calcific plaque w/ >60% diameter reduction, Lft prox SFA 70-99% diameter reduction, Lft POP-large amount of irregular mixed suggesting <50% diameter reduction  . NM MYOVIEW LTD  06/25/2008   No scintigraphic evidence of inducible myocardial ischemia  . PERIPHERAL VASCULAR ANGIOGRAM  07/13/2009   No intervention - "plan on doing staged intervention in near future"  . TRANSTHORACIC ECHOCARDIOGRAM  11/14/2007   EF >55%, mild mitral and tricuspid regurg.     reports that she quit smoking about 15 years ago. Her smoking use included Cigarettes. She has never used smokeless tobacco. She reports that she drinks about 0.6 oz of alcohol per week . She reports that she does not use drugs.  Allergies  Allergen Reactions  . Lipitor [Atorvastatin]   .  Pletal [Cilostazol]     Family History  Problem Relation Age of Onset  . Heart attack Brother   . Cancer Brother        Bone cancer   Family history reviewed and not pertinent.   Prior to Admission medications   Medication  Sig Start Date End Date Taking? Authorizing Provider  aspirin 325 MG tablet Take 325 mg by mouth daily.    [provider]  Cholecalciferol (VITAMIN D-3 PO) Take 1 tablet by mouth once a week.     [provider]  hydrALAZINE (APRESOLINE) 25 MG tablet Take 1 tablet (25 mg total) by mouth 2 (two) times daily. 12/03/16   Domenic Polite, MD  HYDROcodone-acetaminophen (NORCO/VICODIN) 5-325 MG per tablet Take 1 tablet by mouth every 6 (six) hours as needed for moderate pain.    [provider]  Polyethylene Glycol 3350 (MIRALAX PO) Take 1 packet by mouth daily as needed (constipation).     [provider]  Probiotic Product (ALIGN) 4 MG CAPS Take by mouth daily.    [provider]  simvastatin (ZOCOR) 10 MG tablet Take 1 tablet (10 mg total) by mouth at bedtime. 07/11/13   Leonie Man, MD  traMADol (ULTRAM) 50 MG tablet Take 0.5 tablets (25 mg total) by mouth every 6 (six) hours as needed for moderate pain. 11/25/16   Orpah Greek, MD    Physical Exam: Vitals:   12/09/16 1823  BP: (!) 181/73  Pulse: 78  Resp: 15  Temp: 97.8 F (36.6 C)  TempSrc: Oral  SpO2: 99%    Constitutional: NAD, calm, comfortable.kyphoscoliosis  Vitals:   12/09/16 1823  BP: (!) 181/73  Pulse: 78  Resp: 15  Temp: 97.8 F (36.6 C)  TempSrc: Oral  SpO2: 99%   Eyes: PERRLA, lids and conjunctivae normal ENMT: Mucous membranes are mildly dry. Posterior pharynx clear of any exudate or lesions.Normal dentition.  Neck: normal, supple, no masses, no thyromegaly Respiratory: clear to auscultation bilaterally, no wheezing, no crackles. Normal respiratory effort. No accessory muscle use.  Cardiovascular: Regular rate and rhythm, no murmurs / rubs / gallops. No extremity edema. 2+ pedal pulses. No carotid bruits.  Abdomen: no tenderness, no masses palpated. No hepatosplenomegaly. Bowel sounds positive.  Musculoskeletal: no clubbing / cyanosis. No joint deformity  upper and lower extremities. Good ROM, no contractures. Normal muscle tone.  Skin: no rashes, lesions, ulcers. No induration Neurologic: CN 2-12 grossly intact. Sensation intact, DTR normal. Strength 5/5 in all 4.  Psychiatric: Normal judgment and insight. Alert and oriented x 3. Normal mood.    Labs on Admission: I have personally reviewed following labs and imaging studies  CBC:  Recent Labs Lab 12/03/16 1120  WBC 9.3  HGB 11.4*  HCT 34.9*  MCV 92.8  PLT 510*   Basic Metabolic Panel:  Recent Labs Lab 12/03/16 1120  NA 129*  K 3.4*  CL 92*  CO2 28  GLUCOSE 142*  BUN 18  CREATININE 1.17*  CALCIUM 9.4   GFR: Estimated Creatinine Clearance: 18.4 mL/min (A) (by C-G formula based on SCr of 1.17 mg/dL (H)). Liver Function Tests: No results for input(s): AST, ALT, ALKPHOS, BILITOT, PROT, ALBUMIN in the last 168 hours. No results for input(s): LIPASE, AMYLASE in the last 168 hours. No results for input(s): AMMONIA in the last 168 hours. Coagulation Profile: No results for input(s): INR, PROTIME in the last 168 hours. Cardiac Enzymes: No results for input(s): CKTOTAL, CKMB, CKMBINDEX, TROPONINI in the last 168  hours. BNP (last 3 results) No results for input(s): PROBNP in the last 8760 hours. HbA1C: No results for input(s): HGBA1C in the last 72 hours. CBG: No results for input(s): GLUCAP in the last 168 hours. Lipid Profile: No results for input(s): CHOL, HDL, LDLCALC, TRIG, CHOLHDL, LDLDIRECT in the last 72 hours. Thyroid Function Tests: No results for input(s): TSH, T4TOTAL, FREET4, T3FREE, THYROIDAB in the last 72 hours. Anemia Panel: No results for input(s): VITAMINB12, FOLATE, FERRITIN, TIBC, IRON, RETICCTPCT in the last 72 hours. Urine analysis:    Component Value Date/Time   COLORURINE YELLOW 12/01/2016 [place] 12/01/2016 1734   LABSPEC 1.009 12/01/2016 1734   PHURINE 5.0 12/01/2016 1734   GLUCOSEU NEGATIVE 12/01/2016 1734   HGBUR  NEGATIVE 12/01/2016 1734   BILIRUBINUR NEGATIVE 12/01/2016 1734   KETONESUR NEGATIVE 12/01/2016 1734   PROTEINUR NEGATIVE 12/01/2016 1734   NITRITE NEGATIVE 12/01/2016 1734   LEUKOCYTESUR NEGATIVE 12/01/2016 1734    Radiological Exams on Admission: No results found.  EKG: Not done  Assessment/Plan Principal Problem:   Acute hyponatremia Active Problems:   Atherosclerotic PVD with intermittent claudication - near occlusive SFA disease with moderate iliac disease   Essential hypertension   Hyperlipidemia with target LDL less than 100   Chronic kidney disease (CKD), stage III (moderate)   AAA (abdominal aortic aneurysm) (HCC)   Protein-calorie malnutrition (HCC)   Anemia   Pancreatic calcification   Hyponatremia   Dizziness   Weakness    #1 acute recurrent hyponatremia Questionable etiology. Patient recently hospitalized and discharged on 12/03/2016 for acute hyponatremia which responded to IV fluids. Nose concern for possible SIADH. Patient noted to have a sodium of 120  3 days prior to admission her nephrologist office. Will admit patient to telemetry. Check orthostatics. Check a comprehensive metabolic profile, CBC, TSH, cortisol level, serum osmolality, urine osmolality, urine sodium, urine creatinine, chest x-ray, CT head. Due to recurrent nature of patient's significant hyponatremia will consult with nephrology for further evaluation and management. Will await urine and serum osmolality studies prior to deciding whether to place patient on IV fluids.   #2 chronic kidney disease stage III Check a comprehensive metabolic profile. Follow.  #3   hypertension Resume regimen of hydralazine.  #4 dizziness/weakness Likely secondary to problem #1. PT/OT.  #5 AAA Outpatient follow-up. Aspirin.  #6 hyperlipidemia Resume home regimen of Zocor.  #7 peripheral vascular disease Aspirin.  #8 protein calorie malnutrition Place on nutritional supplementation.     DVT  prophylaxis: Heparin Code Status: Full Family Communication: Updated patient and daughter at bedside. Disposition Plan: Home versus SNF pending PT evaluation and once hyponatremia has resolved nephrology. Consults called: Nephrology: Dr. Hollie Salk.  Admission status: Admit to inpatient.   [facility] MD [facility] Pager [phone]  If 7PM-7AM, please contact night-coverage [email or web address] Password [facility]  12/09/2016, 6:47 PM

## 2016-12-10 DIAGNOSIS — E871 Hypo-osmolality and hyponatremia: Secondary | ICD-10-CM

## 2016-12-10 LAB — GLUCOSE, CAPILLARY: Glucose-Capillary: 91 mg/dL (ref 65–99)

## 2016-12-10 LAB — OSMOLALITY: Osmolality: 284 mOsm/kg (ref 275–295)

## 2016-12-10 LAB — BASIC METABOLIC PANEL
ANION GAP: 7 (ref 5–15)
BUN: 43 mg/dL — ABNORMAL HIGH (ref 6–20)
CALCIUM: 8.9 mg/dL (ref 8.9–10.3)
CO2: 27 mmol/L (ref 22–32)
Chloride: 95 mmol/L — ABNORMAL LOW (ref 101–111)
Creatinine, Ser: 1.25 mg/dL — ABNORMAL HIGH (ref 0.44–1.00)
GFR calc Af Amer: 43 mL/min — ABNORMAL LOW (ref >60)
GFR, EST NON AFRICAN AMERICAN: 37 mL/min — AB (ref >60)
Glucose, Bld: 83 mg/dL (ref 65–99)
Potassium: 4.6 mmol/L (ref 3.5–5.1)
SODIUM: 129 mmol/L — AB (ref 135–145)

## 2016-12-10 LAB — CBC
HCT: 26.9 % — ABNORMAL LOW (ref 36.0–46.0)
HEMOGLOBIN: 9.1 g/dL — AB (ref 12.0–15.0)
MCH: 31 pg (ref 26.0–34.0)
MCHC: 33.8 g/dL (ref 30.0–36.0)
MCV: 91.5 fL (ref 78.0–100.0)
Platelets: 436 10*3/uL — ABNORMAL HIGH (ref 150–400)
RBC: 2.94 MIL/uL — ABNORMAL LOW (ref 3.87–5.11)
RDW: 13 % (ref 11.5–15.5)
WBC: 8.3 10*3/uL (ref 4.0–10.5)

## 2016-12-10 LAB — OSMOLALITY, URINE: Osmolality, Ur: 439 mOsm/kg (ref 300–900)

## 2016-12-10 LAB — TSH: TSH: 1.171 u[IU]/mL (ref 0.350–4.500)

## 2016-12-10 LAB — CORTISOL: Cortisol, Plasma: 26.8 ug/dL

## 2016-12-10 MED ORDER — DIPHENHYDRAMINE HCL 25 MG PO CAPS
25.0000 mg | ORAL_CAPSULE | Freq: Once | ORAL | Status: AC
  Administered 2016-12-10: 25 mg via ORAL
  Filled 2016-12-10: qty 1

## 2016-12-10 MED ORDER — SODIUM CHLORIDE 0.9 % IV SOLN
INTRAVENOUS | Status: DC
  Administered 2016-12-10: 10:00:00 via INTRAVENOUS

## 2016-12-10 NOTE — Progress Notes (Signed)
PROGRESS NOTE    Kristina [name]  ZOX:[id number] DOB: February 15, 1926 DOA: 12/09/2016 PCP: [name], MD  Outpatient Specialists:     Brief Narrative:  83 female htn ckd III + solitary kidney--has R ra occlusion Pancreatic calcification hld AAA-last doppler 07/2015 with near SFA occlision Iron def anemia Weight loss 80 lbs recently  Recent admit 12/01/16--> 12/03/16 with weakness, ? UTI Rx tramadol and abx   Assessment & Plan:   Principal Problem:   Acute hyponatremia Active Problems:   Atherosclerotic PVD with intermittent claudication - near occlusive SFA disease with moderate iliac disease   Essential hypertension   Hyperlipidemia with target LDL less than 100   Chronic kidney disease (CKD), stage III (moderate)   AAA (abdominal aortic aneurysm) (HCC)   Protein-calorie malnutrition (HCC)   Anemia   Pancreatic calcification   Hyponatremia   Dizziness   Weakness   Hypovolemic hyponatremia sodium 126-->129 Last known labs in care everywhere 03/2010 shows a sodium 139 Urine studies show likely Siadh although I am not confident of these studies Will get a tsh and free am cortisol Suspect she also has tea-toast potomania/reset osmostat although doesn't drink much water from her own admission Will repeat urien studies in the am Given her element of non-intentional weight loss etc, reasonably to consider OP low dose Ct scan chest Restrict oral fluid to 1500 cc/d Not on IVF on admit--started on iv saline 75 cc/h given mild volime depletion.   Await input from nephrology--have d/w dr. Charlesetta Garibaldi  Pancreatic calcification without pancreatitis-needs OP contineud follow up Dr. Collene Mares No acitve issue at this time  aki Last bun/creat was 18/1.2 when recently admit Her baslein in care everywhere bounces wound from 1.5-->1.7 Giving ivf 75 cc/h  AAA OP surveillance  CKD + Stensoiss R Renal A Monitor with am labs Avoid nephrotoxins-wouldn't use ACe/Diueratics Might be  candirdate for CO2 Angio if felt neede das per Dr. Norman Clay who has seen her for this in the past  HLd  htn  Hydralazine 25 bid.  Reasonably controlled   lovenox Inpatient  Await nephro input Called daughter-no asnwer left VM   Consultants:   nephro  Procedures:    none yet  Antimicrobials:    none    Subjective:  Awake alert mildy dizzy earleir which has resolved Orthostatics neg no cp [place] fucntioning at home--still drives and does for herself   Objective: Vitals:   12/09/16 1947 12/09/16 2031 12/10/16 0016 12/10/16 0539  BP: (!) 172/68 (!) 184/73 123/77 (!) 118/52  Pulse:  78 77 63  Resp:  16  16  Temp:  97.9 F (36.6 C)  97.9 F (36.6 C)  TempSrc:  Oral  Oral  SpO2:  99%    Weight: 36.2 kg (79 lb 14.4 oz)   36 kg (79 lb 5.9 oz)  Height: 4\' 11"  (1.499 m)       Intake/Output Summary (Last 24 hours) at 12/10/16 0820 Last data filed at 12/10/16 0548  Gross per 24 hour  Intake                0 ml  Output              300 ml  Net             -300 ml   Filed Weights   12/09/16 1947 12/10/16 0539  Weight: 36.2 kg (79 lb 14.4 oz) 36 kg (79 lb 5.9 oz)    Examination:  Alert pleasant but frail  n ict no pallor No thyromegally No jvd No rales nor ronchi No abd pain No rebound no guard No le edema No rash    Data Reviewed: I have personally reviewed following labs and imaging studies  CBC:  Recent Labs Lab 12/03/16 1120 12/09/16 1905 12/10/16 0520  WBC 9.3 10.9* 8.3  NEUTROABS  --  8.6*  --   HGB 11.4* 10.7* 9.1*  HCT 34.9* 31.7* 26.9*  MCV 92.8 91.1 91.5  PLT 449* 475* 350*   Basic Metabolic Panel:  Recent Labs Lab 12/03/16 1120 12/09/16 1905 12/10/16 0520  NA 129* 126* 129*  K 3.4* 4.5 4.6  CL 92* 90* 95*  CO2 28 26 27   GLUCOSE 142* 98 83  BUN 18 43* 43*  CREATININE 1.17* 1.14* 1.25*  CALCIUM 9.4 9.4 8.9  MG  --  1.9  --    GFR: Estimated Creatinine Clearance: 17.7 mL/min (A) (by C-G formula based on SCr of  1.25 mg/dL (H)). Liver Function Tests:  Recent Labs Lab 12/09/16 1905  AST 22  ALT 16  ALKPHOS 58  BILITOT 0.2*  PROT 7.4  ALBUMIN 4.0   No results for input(s): LIPASE, AMYLASE in the last 168 hours. No results for input(s): AMMONIA in the last 168 hours. Coagulation Profile:  Recent Labs Lab 12/09/16 1905  INR 0.89   Cardiac Enzymes: No results for input(s): CKTOTAL, CKMB, CKMBINDEX, TROPONINI in the last 168 hours. BNP (last 3 results) No results for input(s): PROBNP in the last 8760 hours. HbA1C: No results for input(s): HGBA1C in the last 72 hours. CBG:  Recent Labs Lab 12/10/16 0735  GLUCAP 91   Lipid Profile: No results for input(s): CHOL, HDL, LDLCALC, TRIG, CHOLHDL, LDLDIRECT in the last 72 hours. Thyroid Function Tests: No results for input(s): TSH, T4TOTAL, FREET4, T3FREE, THYROIDAB in the last 72 hours. Anemia Panel: No results for input(s): VITAMINB12, FOLATE, FERRITIN, TIBC, IRON, RETICCTPCT in the last 72 hours. Urine analysis:    Component Value Date/Time   COLORURINE STRAW (A) 12/09/2016 1846   APPEARANCEUR CLEAR 12/09/2016 1846   LABSPEC 1.011 12/09/2016 1846   PHURINE 5.0 12/09/2016 1846   GLUCOSEU NEGATIVE 12/09/2016 1846   HGBUR NEGATIVE 12/09/2016 1846   BILIRUBINUR NEGATIVE 12/09/2016 1846   KETONESUR NEGATIVE 12/09/2016 1846   PROTEINUR NEGATIVE 12/09/2016 1846   NITRITE NEGATIVE 12/09/2016 1846   LEUKOCYTESUR SMALL (A) 12/09/2016 1846   Sepsis Labs: @LABRCNTIP (procalcitonin:4,lacticidven:4)  )No results found for this or any previous visit (from the past 240 hour(s)).       Radiology Studies: X-ray Chest Pa And Lateral  Result Date: 12/09/2016 CLINICAL DATA:  Acute onset of hyponatremia.  Initial encounter. EXAM: CHEST  2 VIEW COMPARISON:  Chest radiograph performed 07/12/2009 FINDINGS: The lungs are well-aerated and clear. There is no evidence of focal opacification, pleural effusion or pneumothorax. The heart is normal in  size; the mediastinal contour is within normal limits. No acute osseous abnormalities are seen. IMPRESSION: No acute cardiopulmonary process seen. Electronically Signed   By: Garald Balding M.D.   On: 12/09/2016 21:41   Ct Head Wo Contrast  Result Date: 12/09/2016 CLINICAL DATA:  Acute onset of generalized weakness and dizziness. Hyponatremia. Initial encounter. EXAM: CT HEAD WITHOUT CONTRAST TECHNIQUE: Contiguous axial images were obtained from the base of the skull through the vertex without intravenous contrast. COMPARISON:  None. FINDINGS: Brain: No evidence of acute infarction, hemorrhage, hydrocephalus, extra-axial collection or mass lesion/mass effect. Prominence of the ventricles and sulci reflects moderate cortical volume  loss. Cerebellar atrophy is noted. Scattered periventricular and subcortical white matter change likely reflects small vessel ischemic microangiopathy. The brainstem and fourth ventricle are within normal limits. The basal ganglia are unremarkable in appearance. The cerebral hemispheres demonstrate grossly normal gray-white differentiation. No mass effect or midline shift is seen. Vascular: No hyperdense vessel or unexpected calcification. Skull: There is no evidence of fracture; visualized osseous structures are unremarkable in appearance. Sinuses/Orbits: The visualized portions of the orbits are within normal limits. The paranasal sinuses and mastoid air cells are well-aerated. Other: No significant soft tissue abnormalities are seen. IMPRESSION: 1. No acute intracranial pathology seen on CT. 2. Moderate cortical volume loss and scattered small vessel ischemic microangiopathy. Electronically Signed   By: Garald Balding M.D.   On: 12/09/2016 21:43        Scheduled Meds: . acidophilus  1 capsule Oral QHS  . aspirin EC  325 mg Oral QHS  . feeding supplement (ENSURE ENLIVE)  237 mL Oral BID BM  . heparin  5,000 Units Subcutaneous Q8H  . hydrALAZINE  25 mg Oral BID  .  simvastatin  10 mg Oral QHS  . sodium chloride flush  3 mL Intravenous Q12H  . sodium chloride flush  3 mL Intravenous Q12H   Continuous Infusions: . sodium chloride       LOS: 1 day    Time spent: [place], MD [facility] ([phone]   If 7PM-7AM, please contact night-coverage [email or web address] Password [email or web address] 12/10/2016, 8:20 AM

## 2016-12-10 NOTE — Progress Notes (Signed)
At 2205, patient's BP was 184/77. Scheduled apresoline had been given an hr beforehand. On call was made aware and new orders were given for a one time dose of clonidine. BP was retaken at 0016 and BP was 123/77.

## 2016-12-10 NOTE — Consult Note (Signed)
[facility] Consult Note     Date: 12/10/2016                  Patient Name:  Kristina [name]  MRN: [id number]  DOB: 1925/10/11  Age / Sex: 83 y.o., female         PCP: [name], MD                 Service Requesting Consult: [facility], Dr, Verlon Au                 Reason for Consult: hyponatremia            Chief Complaint: Dizziness  HPI: Pt is an 48F with a PMH significant for hyponatremia in April 2018, AAA, CKD Stage G3, HTN, and uninentional weight loss who is now seen in consultation at the request of Dr. Verlon Au for evaluation of hyponatremia.    Pt was admitted in April 2018 with hyponatremia which was thought to be a mix of hypovolemia and SIADH.  She improved with fluids and her discharge Na was 129 (admit 121).  She was seen in our office on Monday 5/8 where her Na was found to be 120.  Followup labs were planned for yesterday but she came to the hospital instead.    Her Na was 126 on admit and rose to 129 overnight without intervention.  Serum osms 284, urine osms 439, urine sodium 45.  She reports some dizziness.  She has had about 15 lbs of unintentional weight loss.  Past Medical History:  Diagnosis Date  . AAA (abdominal aortic aneurysm) (HCC)    Mild - measuring 2.8x2.8cm  . Acute hyponatremia 11/2016  . 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 HOH (hard of hearing)   . Hyperlipidemia    Well-controlled  . Hypertension   . PAD (peripheral artery disease) (HCC)    LEA Dopplers: Right external iliac 40%, right mid SFA 70-99% with occlusion in Hunter's canal. Angiography. LEFT common iliac 60%, left SFA 70-9%, popliteal 50%; RIGHT posterior tibial occluded;; carotid Dopplers in 2009: Less than 50% stenosis.  . Renal artery stenosis [facility])     Past Surgical History:  Procedure Laterality Date  . LOWER EXTREMITY ARTERIAL DOPPLER  05/30/2011    Right EIA->50% daimeter reduction, Rt SFA 70-99% diameter reduction, Rt SFA-occlusive disease at Hunter's canal, Rt PTA-appeared occluded, Lft CIA-moderate amount of calcific plaque w/ >60% diameter reduction, Lft prox SFA 70-99% diameter reduction, Lft POP-large amount of irregular mixed suggesting <50% diameter reduction  . NM MYOVIEW LTD  06/25/2008   No scintigraphic evidence of inducible myocardial ischemia  . PERIPHERAL VASCULAR ANGIOGRAM  07/13/2009   No intervention - "plan on doing staged intervention in near future"  . TRANSTHORACIC ECHOCARDIOGRAM  11/14/2007   EF >55%, mild mitral and tricuspid regurg.    Family History  Problem Relation Age of Onset  . Heart attack Brother   . Cancer Brother        Bone cancer   Social History:  reports that she quit smoking about 15 years ago. Her smoking use included Cigarettes. She has never used smokeless tobacco. She reports that she drinks about 0.6 oz of alcohol per week . She reports that she does not use drugs.  Allergies:  Allergies  Allergen Reactions  . Lipitor [Atorvastatin] Other (See Comments)    Reaction:  Unknown   .  Pletal [Cilostazol] Other (See Comments)    Reaction:  Unknown     Medications Prior to Admission  Medication Sig Dispense Refill  . aspirin EC 325 MG tablet Take 325 mg by mouth at bedtime.     . hydrALAZINE (APRESOLINE) 25 MG tablet Take 1 tablet (25 mg total) by mouth 2 (two) times daily. 30 tablet 0  . Probiotic Product (ALIGN) 4 MG CAPS Take 4 mg by mouth at bedtime.     . simvastatin (ZOCOR) 10 MG tablet Take 1 tablet (10 mg total) by mouth at bedtime. 30 tablet 11    Results for orders placed or performed during the hospital encounter of 12/09/16 (from the past 48 hour(s))  Urinalysis, Routine w reflex microscopic     Status: Abnormal   Collection Time: 12/09/16  6:46 PM  Result Value Ref Range   Color, Urine STRAW (A) YELLOW   APPearance CLEAR CLEAR   Specific Gravity, Urine 1.011 1.005 - 1.030    pH 5.0 5.0 - 8.0   Glucose, UA NEGATIVE NEGATIVE mg/dL   Hgb urine dipstick NEGATIVE NEGATIVE   Bilirubin Urine NEGATIVE NEGATIVE   Ketones, ur NEGATIVE NEGATIVE mg/dL   Protein, ur NEGATIVE NEGATIVE mg/dL   Nitrite NEGATIVE NEGATIVE   Leukocytes, UA SMALL (A) NEGATIVE   RBC / HPF 0-5 0 - 5 RBC/hpf   WBC, UA 6-30 0 - 5 WBC/hpf   Bacteria, UA NONE SEEN NONE SEEN   Squamous Epithelial / LPF NONE SEEN NONE SEEN  Sodium, urine, random     Status: None   Collection Time: 12/09/16  6:46 PM  Result Value Ref Range   Sodium, Ur 45 mmol/L    Comment: Performed at [facility], [address] [place], [place] [address]  Creatinine, urine, random     Status: None   Collection Time: 12/09/16  6:46 PM  Result Value Ref Range   Creatinine, Urine 50.30 mg/dL    Comment: Performed at [facility] [address], [place], [place] [address]  Osmolality, urine     Status: None   Collection Time: 12/09/16  6:46 PM  Result Value Ref Range   Osmolality, Ur 439 300 - 900 mOsm/kg    Comment: Performed at [facility] [address], [place], [place] [address]  Comprehensive metabolic panel     Status: Abnormal   Collection Time: 12/09/16  7:05 PM  Result Value Ref Range   Sodium 126 (L) 135 - 145 mmol/L   Potassium 4.5 3.5 - 5.1 mmol/L   Chloride 90 (L) 101 - 111 mmol/L   CO2 26 22 - 32 mmol/L   Glucose, Bld 98 65 - 99 mg/dL   BUN 43 (H) 6 - 20 mg/dL   Creatinine, Ser 1.14 (H) 0.44 - 1.00 mg/dL   Calcium 9.4 8.9 - 10.3 mg/dL   Total Protein 7.4 6.5 - 8.1 g/dL   Albumin 4.0 3.5 - 5.0 g/dL   AST 22 15 - 41 U/L   ALT 16 14 - 54 U/L   Alkaline Phosphatase 58 38 - 126 U/L   Total Bilirubin 0.2 (L) 0.3 - 1.2 mg/dL   GFR calc non Af Amer 42 (L) >60 mL/min   GFR calc Af Amer 48 (L) >60 mL/min    Comment: (NOTE) The eGFR has been calculated using the CKD EPI equation. This calculation has not been validated in all clinical situations. eGFR's persistently <60 mL/min signify  possible Chronic Kidney Disease.  Anion gap 10 5 - 15  Magnesium     Status: None   Collection Time: 12/09/16  7:05 PM  Result Value Ref Range   Magnesium 1.9 1.7 - 2.4 mg/dL  CBC WITH DIFFERENTIAL     Status: Abnormal   Collection Time: 12/09/16  7:05 PM  Result Value Ref Range   WBC 10.9 (H) 4.0 - 10.5 K/uL   RBC 3.48 (L) 3.87 - 5.11 MIL/uL   Hemoglobin 10.7 (L) 12.0 - 15.0 g/dL   HCT 31.7 (L) 36.0 - 46.0 %   MCV 91.1 78.0 - 100.0 fL   MCH 30.7 26.0 - 34.0 pg   MCHC 33.8 30.0 - 36.0 g/dL   RDW 12.9 11.5 - 15.5 %   Platelets 475 (H) 150 - 400 K/uL   Neutrophils Relative % 79 %   Neutro Abs 8.6 (H) 1.7 - 7.7 K/uL   Lymphocytes Relative 10 %   Lymphs Abs 1.1 0.7 - 4.0 K/uL   Monocytes Relative 10 %   Monocytes Absolute 1.1 (H) 0.1 - 1.0 K/uL   Eosinophils Relative 1 %   Eosinophils Absolute 0.1 0.0 - 0.7 K/uL   Basophils Relative 0 %   Basophils Absolute 0.0 0.0 - 0.1 K/uL  APTT     Status: None   Collection Time: 12/09/16  7:05 PM  Result Value Ref Range   aPTT 32 24 - 36 seconds  Protime-INR     Status: None   Collection Time: 12/09/16  7:05 PM  Result Value Ref Range   Prothrombin Time 12.0 11.4 - 15.2 seconds   INR 0.89   Cortisol     Status: None   Collection Time: 12/09/16  7:05 PM  Result Value Ref Range   Cortisol, Plasma 13.6 ug/dL    Comment: (NOTE) AM    6.7 - 22.6 ug/dL PM   <10.0       ug/dL Performed at [facility], [address] [place], [place] [address]   Osmolality     Status: None   Collection Time: 12/09/16  7:05 PM  Result Value Ref Range   Osmolality 284 275 - 295 mOsm/kg    Comment: Performed at [facility], [place] [address] [place], [place] [address]  Basic metabolic panel     Status: Abnormal   Collection Time: 12/10/16  5:20 AM  Result Value Ref Range   Sodium 129 (L) 135 - 145 mmol/L   Potassium 4.6 3.5 - 5.1 mmol/L   Chloride 95 (L) 101 - 111 mmol/L   CO2 27 22 - 32 mmol/L   Glucose, Bld 83 65 - 99 mg/dL   BUN  43 (H) 6 - 20 mg/dL   Creatinine, Ser 1.25 (H) 0.44 - 1.00 mg/dL   Calcium 8.9 8.9 - 10.3 mg/dL   GFR calc non Af Amer 37 (L) >60 mL/min   GFR calc Af Amer 43 (L) >60 mL/min    Comment: (NOTE) The eGFR has been calculated using the CKD EPI equation. This calculation has not been validated in all clinical situations. eGFR's persistently <60 mL/min signify possible Chronic Kidney Disease.    Anion gap 7 5 - 15  CBC     Status: Abnormal   Collection Time: 12/10/16  5:20 AM  Result Value Ref Range   WBC 8.3 4.0 - 10.5 K/uL   RBC 2.94 (L) 3.87 - 5.11 MIL/uL   Hemoglobin 9.1 (L) 12.0 - 15.0 g/dL   HCT 26.9 (L) 36.0 -  46.0 %   MCV 91.5 78.0 - 100.0 fL   MCH 31.0 26.0 - 34.0 pg   MCHC 33.8 30.0 - 36.0 g/dL   RDW 13.0 11.5 - 15.5 %   Platelets 436 (H) 150 - 400 K/uL  Cortisol     Status: None   Collection Time: 12/10/16  5:20 AM  Result Value Ref Range   Cortisol, Plasma 26.8 ug/dL    Comment: (NOTE) AM    6.7 - 22.6 ug/dL PM   <10.0       ug/dL Performed at [facility] [address], [place], [place] [address]   Glucose, capillary     Status: None   Collection Time: 12/10/16  7:35 AM  Result Value Ref Range   Glucose-Capillary 91 65 - 99 mg/dL   Comment 1 Notify RN   TSH     Status: None   Collection Time: 12/10/16 11:20 AM  Result Value Ref Range   TSH 1.171 0.350 - 4.500 uIU/mL    Comment: Performed by a 3rd Generation assay with a functional sensitivity of <=0.01 uIU/mL.   X-ray Chest Pa And Lateral  Result Date: 12/09/2016 CLINICAL DATA:  Acute onset of hyponatremia.  Initial encounter. EXAM: CHEST  2 VIEW COMPARISON:  Chest radiograph performed 07/12/2009 FINDINGS: The lungs are well-aerated and clear. There is no evidence of focal opacification, pleural effusion or pneumothorax. The heart is normal in size; the mediastinal contour is within normal limits. No acute osseous abnormalities are seen. IMPRESSION: No acute cardiopulmonary process seen. Electronically  Signed   By: Garald Balding M.D.   On: 12/09/2016 21:41   Ct Head Wo Contrast  Result Date: 12/09/2016 CLINICAL DATA:  Acute onset of generalized weakness and dizziness. Hyponatremia. Initial encounter. EXAM: CT HEAD WITHOUT CONTRAST TECHNIQUE: Contiguous axial images were obtained from the base of the skull through the vertex without intravenous contrast. COMPARISON:  None. FINDINGS: Brain: No evidence of acute infarction, hemorrhage, hydrocephalus, extra-axial collection or mass lesion/mass effect. Prominence of the ventricles and sulci reflects moderate cortical volume loss. Cerebellar atrophy is noted. Scattered periventricular and subcortical white matter change likely reflects small vessel ischemic microangiopathy. The brainstem and fourth ventricle are within normal limits. The basal ganglia are unremarkable in appearance. The cerebral hemispheres demonstrate grossly normal gray-white differentiation. No mass effect or midline shift is seen. Vascular: No hyperdense vessel or unexpected calcification. Skull: There is no evidence of fracture; visualized osseous structures are unremarkable in appearance. Sinuses/Orbits: The visualized portions of the orbits are within normal limits. The paranasal sinuses and mastoid air cells are well-aerated. Other: No significant soft tissue abnormalities are seen. IMPRESSION: 1. No acute intracranial pathology seen on CT. 2. Moderate cortical volume loss and scattered small vessel ischemic microangiopathy. Electronically Signed   By: Garald Balding M.D.   On: 12/09/2016 21:43    ROS: all other systems reviewed and are negative except as per HPI  Blood pressure (!) 118/52, pulse 63, temperature 97.9 F (36.6 C), temperature source Oral, resp. rate 16, height 4' 11"  (1.499 m), weight 36 kg (79 lb 5.9 oz), SpO2 99 %. Physical Exam  GEN elderly, NAD HEENT EOMI, PERRL, MMM NECK supple no JVD PULM clear CV RRR ABD hyperactive bowel sounds, nontender EXT no LE  edema NEURO AAO x 3  Assessment/Plan  1.  Hyponatremia: reviewing her first hospitalization for hyponatremia, it appears that primary driver for hyponatremia was hypovolemia with SIADH superimposed.  However, this time it appears she's better hydrated  and her urine osms are inappropriately elevated for her low serum sodium in the absence of a low urine sodium.  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  So- I'm going to stop her fluids for now and place on fluid restriction.  Workup for unintentional weight loss.  Adding on SPEP.  TSH and cortisol are fine.  CT head without mass.  CXR without mass either.    2.  Unintentional weight loss- of course malignancy workup in process.  ? If she actually has mesenteric ischemia- early satiety but no frank pain after eating.  She does have iron deficiency anemia- ? Need for further evaluation.  CT abd/pelvis last admission with adrenal adenoma.   Madelon Lips, MD [facility] pgr [phone] Cell [phone] 12/10/2016, 1:29 PM

## 2016-12-11 ENCOUNTER — Encounter (HOSPITAL_COMMUNITY): Payer: Self-pay | Admitting: Radiology

## 2016-12-11 ENCOUNTER — Inpatient Hospital Stay (HOSPITAL_COMMUNITY): Payer: Medicare Other

## 2016-12-11 LAB — SODIUM, URINE, RANDOM: Sodium, Ur: 48 mmol/L

## 2016-12-11 LAB — CREATININE, URINE, RANDOM: CREATININE, URINE: 31.37 mg/dL

## 2016-12-11 LAB — URINE CULTURE

## 2016-12-11 LAB — BASIC METABOLIC PANEL
ANION GAP: 7 (ref 5–15)
BUN: 35 mg/dL — ABNORMAL HIGH (ref 6–20)
CALCIUM: 8.9 mg/dL (ref 8.9–10.3)
CO2: 27 mmol/L (ref 22–32)
Chloride: 95 mmol/L — ABNORMAL LOW (ref 101–111)
Creatinine, Ser: 1.22 mg/dL — ABNORMAL HIGH (ref 0.44–1.00)
GFR calc non Af Amer: 38 mL/min — ABNORMAL LOW (ref >60)
GFR, EST AFRICAN AMERICAN: 44 mL/min — AB (ref >60)
GLUCOSE: 83 mg/dL (ref 65–99)
POTASSIUM: 4.7 mmol/L (ref 3.5–5.1)
Sodium: 129 mmol/L — ABNORMAL LOW (ref 135–145)

## 2016-12-11 LAB — GLUCOSE, CAPILLARY: Glucose-Capillary: 87 mg/dL (ref 65–99)

## 2016-12-11 MED ORDER — MECLIZINE HCL 25 MG PO TABS
12.5000 mg | ORAL_TABLET | Freq: Two times a day (BID) | ORAL | Status: DC
  Administered 2016-12-11 – 2016-12-12 (×3): 12.5 mg via ORAL
  Filled 2016-12-11 (×3): qty 1

## 2016-12-11 NOTE — Progress Notes (Addendum)
PROGRESS NOTE    Kristina [name]  FGH:[id number] DOB: 11/29/1925 DOA: 12/09/2016 PCP: [name], MD  Outpatient Specialists:     Brief Narrative:  14 female htn ckd III + solitary kidney--has R ra occlusion Pancreatic calcification hld AAA-last doppler 07/2015 with near SFA occlision Iron def anemia Weight loss 80 lbs recently  Recent admit 12/01/16--> 12/03/16 with weakness, ? UTI Rx tramadol and abx Sodium resolving on its own without additio of saline   Assessment & Plan:   Principal Problem:   Acute hyponatremia Active Problems:   Atherosclerotic PVD with intermittent claudication - near occlusive SFA disease with moderate iliac disease   Essential hypertension   Hyperlipidemia with target LDL less than 100   Chronic kidney disease (CKD), stage III (moderate)   AAA (abdominal aortic aneurysm) (HCC)   Protein-calorie malnutrition (HCC)   Anemia   Pancreatic calcification   Hyponatremia   Dizziness   Weakness   Hypovolemic hyponatremia-SIADH sodium 126-->129 and stable Last known labs in care everywhere 03/2010 shows a sodium 139  tsh and free am cortisol-both normal Await Free light chains and SPEP ordere dby Nephrology Given her element of non-intentional weight loss etc, orderingr  low dose Ct scan chest and Ct abd limited--she has h/o pancreatic calcification as well.   Restrict oral fluid to 1500 cc/d Allow reg diet Might give lasix as OP de[ending on scans  Await input from nephrology  Pancreatic calcification without pancreatitis-needs OP contineud follow up Dr. Collene Mares No acitve issue at this time  aki Last bun/creat was 18/1.2 when recently admit Her baseline in care everywhere bounces wound from 1.5-->1.7  AAA OP surveillance  CKD + Stensoiss R Renal A Monitor with am labs Avoid nephrotoxins-wouldn't use ACe/Diueratics Might be candirdate for CO2 Angio if felt neede das per Dr. Norman Clay who has seen her for this in the past  HLd  htn    Hydralazine 25 bid.  Reasonably controlled   lovenox Inpatient  Await nephro input Called daughter-no asnwer left VM   Consultants:   nephro  Procedures:    none yet  Antimicrobials:    none    Subjective:  Awake alert mildy dizzy earlier which has resolved Orthostatics neg no cp [employer] functioning at home--still drives and does for herself   Objective: Vitals:   12/10/16 1436 12/10/16 2027 12/11/16 0500 12/11/16 0513  BP: (!) 158/61 (!) 173/77  (!) 157/56  Pulse: 72 81  65  Resp: 16 16  18   Temp: 97.7 F (36.5 C) 98.2 F (36.8 C)  98.2 F (36.8 C)  TempSrc: Oral Oral  Oral  SpO2: 99% 93%  97%  Weight:   35.2 kg (77 lb 9.6 oz)   Height:        Intake/Output Summary (Last 24 hours) at 12/11/16 1340 Last data filed at 12/11/16 1100  Gross per 24 hour  Intake              270 ml  Output             3200 ml  Net            -2930 ml   Filed Weights   12/09/16 1947 12/10/16 0539 12/11/16 0500  Weight: 36.2 kg (79 lb 14.4 oz) 36 kg (79 lb 5.9 oz) 35.2 kg (77 lb 9.6 oz)    Examination:  Alert pleasant but frail n ict no pallor No thyromegally No jvd No rales nor ronchi No abd pain No rebound no  guard No le edema No rash    Data Reviewed: I have personally reviewed following labs and imaging studies  CBC:  Recent Labs Lab 12/09/16 1905 12/10/16 0520  WBC 10.9* 8.3  NEUTROABS 8.6*  --   HGB 10.7* 9.1*  HCT 31.7* 26.9*  MCV 91.1 91.5  PLT 475* 062*   Basic Metabolic Panel:  Recent Labs Lab 12/09/16 1905 12/10/16 0520 12/11/16 0550  NA 126* 129* 129*  K 4.5 4.6 4.7  CL 90* 95* 95*  CO2 26 27 27   GLUCOSE 98 83 83  BUN 43* 43* 35*  CREATININE 1.14* 1.25* 1.22*  CALCIUM 9.4 8.9 8.9  MG 1.9  --   --    GFR: Estimated Creatinine Clearance: 17.7 mL/min (A) (by C-G formula based on SCr of 1.22 mg/dL (H)). Liver Function Tests:  Recent Labs Lab 12/09/16 1905  AST 22  ALT 16  ALKPHOS 58  BILITOT 0.2*  PROT 7.4   ALBUMIN 4.0   No results for input(s): LIPASE, AMYLASE in the last 168 hours. No results for input(s): AMMONIA in the last 168 hours. Coagulation Profile:  Recent Labs Lab 12/09/16 1905  INR 0.89   Cardiac Enzymes: No results for input(s): CKTOTAL, CKMB, CKMBINDEX, TROPONINI in the last 168 hours. BNP (last 3 results) No results for input(s): PROBNP in the last 8760 hours. HbA1C: No results for input(s): HGBA1C in the last 72 hours. CBG:  Recent Labs Lab 12/10/16 0735 12/11/16 0745  GLUCAP 91 87   Lipid Profile: No results for input(s): CHOL, HDL, LDLCALC, TRIG, CHOLHDL, LDLDIRECT in the last 72 hours. Thyroid Function Tests:  Recent Labs  12/10/16 1120  TSH 1.171   Anemia Panel: No results for input(s): VITAMINB12, FOLATE, FERRITIN, TIBC, IRON, RETICCTPCT in the last 72 hours. Urine analysis:    Component Value Date/Time   COLORURINE STRAW (A) 12/09/2016 1846   APPEARANCEUR CLEAR 12/09/2016 1846   LABSPEC 1.011 12/09/2016 1846   PHURINE 5.0 12/09/2016 1846   GLUCOSEU NEGATIVE 12/09/2016 1846   HGBUR NEGATIVE 12/09/2016 1846   BILIRUBINUR NEGATIVE 12/09/2016 1846   KETONESUR NEGATIVE 12/09/2016 1846   PROTEINUR NEGATIVE 12/09/2016 1846   NITRITE NEGATIVE 12/09/2016 1846   LEUKOCYTESUR SMALL (A) 12/09/2016 1846   Sepsis Labs: @LABRCNTIP (procalcitonin:4,lacticidven:4)  ) Recent Results (from the past 240 hour(s))  Urine culture     Status: Abnormal   Collection Time: 12/09/16  6:42 PM  Result Value Ref Range Status   Specimen Description URINE, RANDOM  Final   Special Requests NONE  Final   Culture MULTIPLE SPECIES PRESENT, SUGGEST RECOLLECTION (A)  Final   Report Status 12/11/2016 FINAL  Final         Radiology Studies: X-ray Chest Pa And Lateral  Result Date: 12/09/2016 CLINICAL DATA:  Acute onset of hyponatremia.  Initial encounter. EXAM: CHEST  2 VIEW COMPARISON:  Chest radiograph performed 07/12/2009 FINDINGS: The lungs are well-aerated and  clear. There is no evidence of focal opacification, pleural effusion or pneumothorax. The heart is normal in size; the mediastinal contour is within normal limits. No acute osseous abnormalities are seen. IMPRESSION: No acute cardiopulmonary process seen. Electronically Signed   By: Garald Balding M.D.   On: 12/09/2016 21:41   Ct Head Wo Contrast  Result Date: 12/09/2016 CLINICAL DATA:  Acute onset of generalized weakness and dizziness. Hyponatremia. Initial encounter. EXAM: CT HEAD WITHOUT CONTRAST TECHNIQUE: Contiguous axial images were obtained from the base of the skull through the vertex without intravenous contrast. COMPARISON:  None.  FINDINGS: Brain: No evidence of acute infarction, hemorrhage, hydrocephalus, extra-axial collection or mass lesion/mass effect. Prominence of the ventricles and sulci reflects moderate cortical volume loss. Cerebellar atrophy is noted. Scattered periventricular and subcortical white matter change likely reflects small vessel ischemic microangiopathy. The brainstem and fourth ventricle are within normal limits. The basal ganglia are unremarkable in appearance. The cerebral hemispheres demonstrate grossly normal gray-white differentiation. No mass effect or midline shift is seen. Vascular: No hyperdense vessel or unexpected calcification. Skull: There is no evidence of fracture; visualized osseous structures are unremarkable in appearance. Sinuses/Orbits: The visualized portions of the orbits are within normal limits. The paranasal sinuses and mastoid air cells are well-aerated. Other: No significant soft tissue abnormalities are seen. IMPRESSION: 1. No acute intracranial pathology seen on CT. 2. Moderate cortical volume loss and scattered small vessel ischemic microangiopathy. Electronically Signed   By: Garald Balding M.D.   On: 12/09/2016 21:43        Scheduled Meds: . acidophilus  1 capsule Oral QHS  . aspirin EC  325 mg Oral QHS  . feeding supplement (ENSURE  ENLIVE)  237 mL Oral BID BM  . heparin  5,000 Units Subcutaneous Q8H  . hydrALAZINE  25 mg Oral BID  . meclizine  12.5 mg Oral BID  . simvastatin  10 mg Oral QHS  . sodium chloride flush  3 mL Intravenous Q12H  . sodium chloride flush  3 mL Intravenous Q12H   Continuous Infusions: . sodium chloride       LOS: 2 days    Time spent: [place], MD [facility] ([phone]   If 7PM-7AM, please contact night-coverage [email or web address] Password [email or web address] 12/11/2016, 1:40 PM

## 2016-12-11 NOTE — Progress Notes (Signed)
Initial Nutrition Assessment  DOCUMENTATION CODES:   Non-severe (moderate) malnutrition in context of acute illness/injury, Underweight  INTERVENTION:   Continue Ensure Enlive po BID, each supplement provides 350 kcal and 20 grams of protein RD to continue to monitor  NUTRITION DIAGNOSIS:   Malnutrition (Moderate) related to acute illness (renal occlusion) as evidenced by severe depletion of body fat, mild depletion of muscle mass.  GOAL:   Patient will meet greater than or equal to 90% of their needs  MONITOR:   PO intake, Supplement acceptance, Labs, Weight trends, I & O's  REASON FOR ASSESSMENT:   Malnutrition Screening Tool    ASSESSMENT:   83 y.o. female with medical history significant of chronic kidney disease stage III with solitary kidney and 1 functional kidney due to renal occlusion on the right, hyperlipidemia, hypertension, peripheral artery disease, AAA who was recently hospitalized from 12/01/2016-12/03/2016 for acute hyponatremia Felt secondary to hypovolemic hyponatremia secondary to poor oral intake and volume depletion and concern for possible SIADH.   Patient continues to have fair appetite. Currently consuming 75% of meals at this time, however. Pt has been ordered Ensure supplements.  Pt has lost 8 lb since 06/27/16 (9% wt loss x 6 months, insignificant for time frame). Nutrition-Focused physical exam completed. Findings are severe fat depletion, mild muscle depletion, and no edema.   Medications: Miralax PRN Labs reviewed: Low Na  Diet Order:  Diet heart healthy/carb modified Room service appropriate? Yes; Fluid consistency: Thin; Fluid restriction: 1200 mL Fluid  Skin:  Reviewed, no issues  Last BM:  5/12  Height:   Ht Readings from Last 1 Encounters:  12/09/16 4\' 11"  (1.499 m)    Weight:   Wt Readings from Last 1 Encounters:  12/11/16 77 lb 9.6 oz (35.2 kg)    Ideal Body Weight:  44.7 kg  BMI:  Body mass index is 15.67  kg/m.  Estimated Nutritional Needs:   Kcal:  1200-1400  Protein:  50-60g  Fluid:  1.4L/day  EDUCATION NEEDS:   No education needs identified at this time  Clayton Bibles, MS, RD, LDN Pager: [phone] After Hours Pager: [phone]

## 2016-12-11 NOTE — Progress Notes (Addendum)
[facility] Progress Note    Assessment/ Plan:   1.  Hyponatremia: reviewing her first hospitalization for hyponatremia, it appears that primary driver for hyponatremia was hypovolemia with SIADH superimposed.  However, this time it appears she's better hydrated and her urine osms are inappropriately elevated for her low serum sodium in the absence of a low urine sodium.  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  Workup for unintentional weight loss.  Adding on SPEP and serum free light chains.  TSH and cortisol are fine.  CT head without mass.  CXR without mass either.  I would not tell her to salt restrict her diet due to unintentional wt loss.  Fluid restriction of 1200 mL.  Na at 129; if no improvement may need to add low-dose Lasix of 20 mg daily.  Orthostatics are negative.  2.  Unintentional weight loss- of course malignancy workup in process.  ? If she actually has mesenteric ischemia- early satiety but no frank pain after eating.  She does have iron deficiency anemia- ? Need for further evaluation.  CT abd/pelvis last admission with adrenal adenoma.  Subjective:    Sleeping, easily arousable   Objective:   BP (!) 157/56 (BP Location: Left Arm)   Pulse 65   Temp 98.2 F (36.8 C) (Oral)   Resp 18   Ht 4\' 11"  (1.499 m)   Wt 35.2 kg (77 lb 9.6 oz)   SpO2 97%   BMI 15.67 kg/m   Intake/Output Summary (Last 24 hours) at 12/11/16 1136 Last data filed at 12/11/16 1100  Gross per 24 hour  Intake              270 ml  Output             3200 ml  Net            -2930 ml   Weight change: -1.042 kg (-2 lb 4.8 oz)  Physical Exam: GEN elderly, NAD HEENT EOMI, PERRL, MMM NECK supple, 3 cm JVD  PULM clear CV RRR ABD hyperactive bowel sounds, nontender EXT no LE edema NEURO AAO x 3  Imaging: X-ray Chest Pa And Lateral  Result  Date: 12/09/2016 CLINICAL DATA:  Acute onset of hyponatremia.  Initial encounter. EXAM: CHEST  2 VIEW COMPARISON:  Chest radiograph performed 07/12/2009 FINDINGS: The lungs are well-aerated and clear. There is no evidence of focal opacification, pleural effusion or pneumothorax. The heart is normal in size; the mediastinal contour is within normal limits. No acute osseous abnormalities are seen. IMPRESSION: No acute cardiopulmonary process seen. Electronically Signed   By: Garald Balding M.D.   On: 12/09/2016 21:41   Ct Head Wo Contrast  Result Date: 12/09/2016 CLINICAL DATA:  Acute onset of generalized weakness and dizziness. Hyponatremia. Initial encounter. EXAM: CT HEAD WITHOUT CONTRAST TECHNIQUE: Contiguous axial images were obtained from the base of the skull through the vertex without intravenous contrast. COMPARISON:  None. FINDINGS: Brain: No evidence of acute infarction, hemorrhage, hydrocephalus, extra-axial collection or mass lesion/mass effect. Prominence of the ventricles and sulci reflects moderate cortical volume loss. Cerebellar atrophy is noted. Scattered periventricular and subcortical white matter change likely reflects small vessel ischemic microangiopathy. The brainstem and fourth ventricle are within normal limits. The basal ganglia are unremarkable in appearance. The cerebral hemispheres demonstrate grossly normal gray-white differentiation. No mass effect or midline shift  is seen. Vascular: No hyperdense vessel or unexpected calcification. Skull: There is no evidence of fracture; visualized osseous structures are unremarkable in appearance. Sinuses/Orbits: The visualized portions of the orbits are within normal limits. The paranasal sinuses and mastoid air cells are well-aerated. Other: No significant soft tissue abnormalities are seen. IMPRESSION: 1. No acute intracranial pathology seen on CT. 2. Moderate cortical volume loss and scattered small vessel ischemic microangiopathy.  Electronically Signed   By: Garald Balding M.D.   On: 12/09/2016 21:43    Labs: BMET  Recent Labs Lab 12/09/16 1905 12/10/16 0520 12/11/16 0550  NA 126* 129* 129*  K 4.5 4.6 4.7  CL 90* 95* 95*  CO2 26 27 27   GLUCOSE 98 83 83  BUN 43* 43* 35*  CREATININE 1.14* 1.25* 1.22*  CALCIUM 9.4 8.9 8.9   CBC  Recent Labs Lab 12/09/16 1905 12/10/16 0520  WBC 10.9* 8.3  NEUTROABS 8.6*  --   HGB 10.7* 9.1*  HCT 31.7* 26.9*  MCV 91.1 91.5  PLT 475* 436*    Medications:    . acidophilus  1 capsule Oral QHS  . aspirin EC  325 mg Oral QHS  . feeding supplement (ENSURE ENLIVE)  237 mL Oral BID BM  . heparin  5,000 Units Subcutaneous Q8H  . hydrALAZINE  25 mg Oral BID  . meclizine  12.5 mg Oral BID  . simvastatin  10 mg Oral QHS  . sodium chloride flush  3 mL Intravenous Q12H  . sodium chloride flush  3 mL Intravenous Q12H      Madelon Lips, MD [facility] 12/11/2016, 11:36 AM

## 2016-12-12 LAB — BASIC METABOLIC PANEL
ANION GAP: 8 (ref 5–15)
BUN: 29 mg/dL — AB (ref 6–20)
CHLORIDE: 92 mmol/L — AB (ref 101–111)
CO2: 28 mmol/L (ref 22–32)
Calcium: 9.3 mg/dL (ref 8.9–10.3)
Creatinine, Ser: 1.14 mg/dL — ABNORMAL HIGH (ref 0.44–1.00)
GFR calc Af Amer: 48 mL/min — ABNORMAL LOW (ref >60)
GFR, EST NON AFRICAN AMERICAN: 42 mL/min — AB (ref >60)
GLUCOSE: 88 mg/dL (ref 65–99)
POTASSIUM: 4.7 mmol/L (ref 3.5–5.1)
Sodium: 128 mmol/L — ABNORMAL LOW (ref 135–145)

## 2016-12-12 LAB — KAPPA/LAMBDA LIGHT CHAINS
KAPPA, LAMDA LIGHT CHAIN RATIO: 1.13 (ref 0.26–1.65)
Kappa free light chain: 22.9 mg/L — ABNORMAL HIGH (ref 3.3–19.4)
Lambda free light chains: 20.3 mg/L (ref 5.7–26.3)

## 2016-12-12 LAB — GLUCOSE, CAPILLARY: GLUCOSE-CAPILLARY: 82 mg/dL (ref 65–99)

## 2016-12-12 MED ORDER — FUROSEMIDE 20 MG PO TABS: 20.0000 mg | ORAL_TABLET | ORAL | 1 refills | Status: DC

## 2016-12-12 MED ORDER — MECLIZINE HCL 12.5 MG PO TABS: 12.5000 mg | ORAL_TABLET | Freq: Two times a day (BID) | ORAL | 0 refills | Status: DC

## 2016-12-12 NOTE — Progress Notes (Signed)
Spoke with pt concerning HH, pt had no preference. Pt's daughter was call with no answer. [facility] was selected, referral was given to [facility] rep.

## 2016-12-12 NOTE — Discharge Summary (Addendum)
Physician Discharge Summary  Kristina [name] WUJ:[id number] DOB: [birth year]  PCP: [name], MD  Admit date: 12/09/2016 Discharge date: 12/12/2016  Time spent: 81 minutes  Recommendations for Outpatient Follow-up:  1. Needs bmet 1 week.  Please follow as OP SPEPS and Serum light chains as myeloma is a small concern.  2. One consideration as an OP to consider is hyponatremic hypertensive syndrome--I have called Vascular Dr Donnetta Hutching to set-up and OP appt for eval of this possibility 3. Meclizine started this admission 4. Needs slight fluid restriction as OP 5. laiosx 20 qod for salt wasting rx on d/c  Discharge Diagnoses:  Principal Problem:   Acute hyponatremia Active Problems:   Atherosclerotic PVD with intermittent claudication - near occlusive SFA disease with moderate iliac disease   Essential hypertension   Hyperlipidemia with target LDL less than 100   Chronic kidney disease (CKD), stage III (moderate)   AAA (abdominal aortic aneurysm) (HCC)   Protein-calorie malnutrition (HCC)   Anemia   Pancreatic calcification   Hyponatremia   Dizziness   Weakness   Discharge Condition: fair   Diet recommendation: reg  Filed Weights   12/11/16 0500 12/12/16 0500 12/12/16 0659  Weight: 35.2 kg (77 lb 9.6 oz) 35.2 kg (77 lb 9.6 oz) 35 kg (77 lb 2.6 oz)    History of present illness:   83 female htn ckd III + solitary kidney--has R ra occlusion Pancreatic calcification hld AAA-last doppler 07/2015 with near SFA occlision Iron def anemia Weight loss 81 lbs recently  Recent admit 81/3/18--> 12/03/16 with weakness, ? UTI Rx tramadol and abx Sodium resolving on its own without addition of saline   Hospital Course:   Hypovolemic hyponatremia-SIADH sodium 126-->129 and stable Last known labs in care everywhere 03/2010 shows a sodium 139  tsh and free am cortisol-both normal Await Free light chains and SPEP ordere dby Nephrology Given her element of  non-intentional weight loss etc, orderingr  low dose Ct scan chest and Ct abd limited--all imaging was benign Restrict oral fluid to 1500 cc/d Allow reg diet She will go home with every other daily lasix 20 mg I have arranged a discussion after discussion with Dr. Donnetta Hutching of Vascular for OP eval of Hyponatremia hypertensive syndrome from her RAS and their office will call family for this appt  Pancreatic calcification without pancreatitis-needs OP contineud follow up Dr. Collene Mares No acitve issue at this time  aki Last bun/creat was 18/1.2 when recently admit Her baseline in care everywhere bounces wound from 1.5-->1.7  AAA OP surveillance  CKD + Stensoiss R Renal A Monitor with am labs Avoid nephrotoxins-wouldn't use ACe/Diueretics at this time but may need ACE to suppress renin going forward if work-up reveals RAS Might be candirdate for CO2 Angio if felt neede das per Dr. Norman Clay who has seen her for this in the past  HLd  htn  Hydralazine 25 bid.  Reasonably controlled   Procedures:  Multiple    Consultations:  nephrology  Vascualr-telephone consutled  Discharge Exam: Vitals:   12/11/16 2110 12/12/16 0505  BP: (!) 162/73 (!) 164/68  Pulse: 66 72  Resp: 18 20  Temp: 97.4 F (36.3 C) 98.1 F (36.7 C)    General: eomi ncat Cardiovascular: s1 s 2no m/r/g Respiratory: clear no adde dsound No abd discofmort   Discharge Instructions   Discharge Instructions    Diet - low sodium heart healthy    Complete by:  As directed    Discharge instructions  Complete by:  As directed    suggest close outpatient follow-up with primary MD We will get a nurse to come to your home and check on you, in addition to checking some labs. Please eat a regular diet WITHOUT salty restriction.  Please also consume upto 1500 cc or 6 -8 standard 250 cc glasses of fluid total a day. I spoke to Vascular surgery- they will call you with an appoitnment to discuss some of your  arterial issues in your kidneys.  something there might be blocked and you should be seen by them to discuss this You will need to have yourself checked by your primary MD in the near future.  You do not have any other pathology so far explaining your low sodium--your primary MD will inform you of your other lab work Take the meclizing for dizzyness   Increase activity slowly    Complete by:  As directed      Current Discharge Medication List    START taking these medications   Details  furosemide (LASIX) 20 MG tablet Take 1 tablet (20 mg total) by mouth every other day. Qty: 30 tablet, Refills: 1    meclizine (ANTIVERT) 12.5 MG tablet Take 1 tablet (12.5 mg total) by mouth 2 (two) times daily. Qty: 30 tablet, Refills: 0      CONTINUE these medications which have NOT CHANGED   Details  aspirin EC 325 MG tablet Take 325 mg by mouth at bedtime.     hydrALAZINE (APRESOLINE) 25 MG tablet Take 1 tablet (25 mg total) by mouth 2 (two) times daily. Qty: 30 tablet, Refills: 0    Probiotic Product (ALIGN) 4 MG CAPS Take 4 mg by mouth at bedtime.     simvastatin (ZOCOR) 10 MG tablet Take 1 tablet (10 mg total) by mouth at bedtime. Qty: 30 tablet, Refills: 11      STOP taking these medications     aspirin 325 MG tablet      HYDROcodone-acetaminophen (NORCO/VICODIN) 5-325 MG per tablet        Allergies  Allergen Reactions  . Lipitor [Atorvastatin] Other (See Comments)    Reaction:  Unknown   . Pletal [Cilostazol] Other (See Comments)    Reaction:  Unknown       The results of significant diagnostics from this hospitalization (including imaging, microbiology, ancillary and laboratory) are listed below for reference.    Significant Diagnostic Studies: Ct Abdomen Wo Contrast  Result Date: 12/11/2016 CLINICAL DATA:  Hyponatremia EXAM: CT CHEST AND ABDOMEN WITHOUT CONTRAST TECHNIQUE: Multidetector CT imaging of the chest and abdomen was performed following the standard protocol  without intravenous contrast. COMPARISON:  CT abdomen/pelvis with contrast dated 11/25/2016 FINDINGS: CT CHEST FINDINGS WITHOUT CONTRAST Cardiovascular: The heart is normal in size. No pericardial effusion. Coronary atherosclerosis the LAD and right coronary artery. No evidence of thoracic aortic aneurysm. Atherosclerotic calcifications of the aortic arch. Mediastinum/Nodes: No suspicious mediastinal lymphadenopathy. Visualized thyroid is unremarkable. Lungs/Pleura: No suspicious pulmonary nodules. Mild centrilobular emphysematous changes, upper lobe predominant. Mild right apical pleural-parenchymal scarring. Minimal nodular scarring in the central right upper lobe (series 4/ image 37). No focal consolidation. No pleural effusion or pneumothorax. Musculoskeletal: Mild degenerative changes of the thoracic spine. CT ABDOMEN FINDINGS WITHOUT CONTRAST Hepatobiliary: Unenhanced liver is unremarkable. Gallbladder is unremarkable. No intrahepatic or extrahepatic ductal dilatation. Pancreas: Pancreas is notable for scattered coarse parenchymal calcifications, likely sequela of prior/ chronic pancreatitis. No pancreatic atrophy or ductal dilatation. No pancreatic mass is evident, although this is  better evaluated on prior enhanced CT. Spleen: Within normal limits. Adrenals/Urinary Tract: 1.8 cm left adrenal nodule, measuring less than 10 HU use on unenhanced CT, compatible with a benign adrenal adenoma. Right adrenal gland is within normal limits. Severe left renal atrophy. Right kidney is notable for two punctate nonobstructing right lower pole renal calculi (coronal images 57-58). No hydronephrosis. Stomach/Bowel: Stomach is within normal limits. Visualized bowel is grossly unremarkable. Vascular/Lymphatic: 3.1 x 3.2 cm infrarenal abdominal aortic aneurysm. Extensive atherosclerotic calcifications of the abdominal aorta and branch vessels. No suspicious abdominal lymphadenopathy. Other: No abdominal ascites.  Musculoskeletal: Visualized osseous structures are within normal limits. IMPRESSION: No evidence of acute cardiopulmonary disease. Scattered pancreatic parenchymal calcifications, likely sequela of prior/ chronic pancreatitis. No pancreatic mass is evident on unenhanced CT. 1.8 cm benign left adrenal adenoma. Two punctate nonobstructing right lower pole renal calculi. No hydronephrosis. 3.1 x 3.2 cm infrarenal abdominal aortic aneurysm. Recommend followup by ultrasound in 3 years. This recommendation follows ACR consensus guidelines: White Paper of the ACR Incidental Findings Committee II on Vascular Findings. J Am Coll Radiol 2013; 10:789-794 Please note that the pelvis was not imaged. Electronically Signed   By: Julian Hy M.D.   On: 12/11/2016 15:54   X-ray Chest Pa And Lateral  Result Date: 12/09/2016 CLINICAL DATA:  Acute onset of hyponatremia.  Initial encounter. EXAM: CHEST  2 VIEW COMPARISON:  Chest radiograph performed 07/12/2009 FINDINGS: The lungs are well-aerated and clear. There is no evidence of focal opacification, pleural effusion or pneumothorax. The heart is normal in size; the mediastinal contour is within normal limits. No acute osseous abnormalities are seen. IMPRESSION: No acute cardiopulmonary process seen. Electronically Signed   By: Garald Balding M.D.   On: 12/09/2016 21:41   Ct Head Wo Contrast  Result Date: 12/09/2016 CLINICAL DATA:  Acute onset of generalized weakness and dizziness. Hyponatremia. Initial encounter. EXAM: CT HEAD WITHOUT CONTRAST TECHNIQUE: Contiguous axial images were obtained from the base of the skull through the vertex without intravenous contrast. COMPARISON:  None. FINDINGS: Brain: No evidence of acute infarction, hemorrhage, hydrocephalus, extra-axial collection or mass lesion/mass effect. Prominence of the ventricles and sulci reflects moderate cortical volume loss. Cerebellar atrophy is noted. Scattered periventricular and subcortical white matter  change likely reflects small vessel ischemic microangiopathy. The brainstem and fourth ventricle are within normal limits. The basal ganglia are unremarkable in appearance. The cerebral hemispheres demonstrate grossly normal gray-white differentiation. No mass effect or midline shift is seen. Vascular: No hyperdense vessel or unexpected calcification. Skull: There is no evidence of fracture; visualized osseous structures are unremarkable in appearance. Sinuses/Orbits: The visualized portions of the orbits are within normal limits. The paranasal sinuses and mastoid air cells are well-aerated. Other: No significant soft tissue abnormalities are seen. IMPRESSION: 1. No acute intracranial pathology seen on CT. 2. Moderate cortical volume loss and scattered small vessel ischemic microangiopathy. Electronically Signed   By: Garald Balding M.D.   On: 12/09/2016 21:43   Ct Chest Wo Contrast  Result Date: 12/11/2016 CLINICAL DATA:  Hyponatremia EXAM: CT CHEST AND ABDOMEN WITHOUT CONTRAST TECHNIQUE: Multidetector CT imaging of the chest and abdomen was performed following the standard protocol without intravenous contrast. COMPARISON:  CT abdomen/pelvis with contrast dated 11/25/2016 FINDINGS: CT CHEST FINDINGS WITHOUT CONTRAST Cardiovascular: The heart is normal in size. No pericardial effusion. Coronary atherosclerosis the LAD and right coronary artery. No evidence of thoracic aortic aneurysm. Atherosclerotic calcifications of the aortic arch. Mediastinum/Nodes: No suspicious mediastinal lymphadenopathy. Visualized thyroid is unremarkable. Lungs/Pleura:  No suspicious pulmonary nodules. Mild centrilobular emphysematous changes, upper lobe predominant. Mild right apical pleural-parenchymal scarring. Minimal nodular scarring in the central right upper lobe (series 4/ image 37). No focal consolidation. No pleural effusion or pneumothorax. Musculoskeletal: Mild degenerative changes of the thoracic spine. CT ABDOMEN FINDINGS  WITHOUT CONTRAST Hepatobiliary: Unenhanced liver is unremarkable. Gallbladder is unremarkable. No intrahepatic or extrahepatic ductal dilatation. Pancreas: Pancreas is notable for scattered coarse parenchymal calcifications, likely sequela of prior/ chronic pancreatitis. No pancreatic atrophy or ductal dilatation. No pancreatic mass is evident, although this is better evaluated on prior enhanced CT. Spleen: Within normal limits. Adrenals/Urinary Tract: 1.8 cm left adrenal nodule, measuring less than 10 HU use on unenhanced CT, compatible with a benign adrenal adenoma. Right adrenal gland is within normal limits. Severe left renal atrophy. Right kidney is notable for two punctate nonobstructing right lower pole renal calculi (coronal images 57-58). No hydronephrosis. Stomach/Bowel: Stomach is within normal limits. Visualized bowel is grossly unremarkable. Vascular/Lymphatic: 3.1 x 3.2 cm infrarenal abdominal aortic aneurysm. Extensive atherosclerotic calcifications of the abdominal aorta and branch vessels. No suspicious abdominal lymphadenopathy. Other: No abdominal ascites. Musculoskeletal: Visualized osseous structures are within normal limits. IMPRESSION: No evidence of acute cardiopulmonary disease. Scattered pancreatic parenchymal calcifications, likely sequela of prior/ chronic pancreatitis. No pancreatic mass is evident on unenhanced CT. 1.8 cm benign left adrenal adenoma. Two punctate nonobstructing right lower pole renal calculi. No hydronephrosis. 3.1 x 3.2 cm infrarenal abdominal aortic aneurysm. Recommend followup by ultrasound in 3 years. This recommendation follows ACR consensus guidelines: White Paper of the ACR Incidental Findings Committee II on Vascular Findings. J Am Coll Radiol 2013; 10:789-794 Please note that the pelvis was not imaged. Electronically Signed   By: Julian Hy M.D.   On: 12/11/2016 15:54   Ct Abdomen Pelvis W Contrast  Result Date: 11/25/2016 CLINICAL DATA:  83 year old  female with generalized abdominal pain. EXAM: CT ABDOMEN AND PELVIS WITH CONTRAST TECHNIQUE: Multidetector CT imaging of the abdomen and pelvis was performed using the standard protocol following bolus administration of intravenous contrast. CONTRAST:  56mL ISOVUE-300 IOPAMIDOL (ISOVUE-300) INJECTION 61% COMPARISON:  Abdominal ultrasound dated 11/24/2016 FINDINGS: Lower chest: There is mild emphysematous changes of the visualized lung. The visualized lung bases are otherwise clear. Multi vessel coronary vascular calcification. There is no intra-abdominal free air or free fluid. Hepatobiliary: No focal liver abnormality is seen. No gallstones, gallbladder wall thickening, or biliary dilatation. Pancreas: There is scattered coarse calcification of the pancreas most consistent with sequela of chronic pancreatitis. No evidence of acute pancreatitis. Correlation with pancreatic enzymes recommended. There is no dilatation of the main pancreatic duct. No peripancreatic fluid collection. Spleen: Normal in size without focal abnormality. Adrenals/Urinary Tract: Left adrenal thickening or nodularity measuring up to 10 mm and may be related to underlying adenoma. The right adrenal gland appears unremarkable. The left kidney is atrophic. The right kidney appears unremarkable with homogeneous enhancement. Subcentimeter right renal inferior pole hypodense lesion is not well characterized but likely represents a cyst. There is no hydronephrosis. The visualized ureters and urinary bladder appear unremarkable. Stomach/Bowel: There is severe sigmoid diverticulosis without active inflammatory changes. There is no evidence of bowel obstruction or active inflammation. Normal appendix. Vascular/Lymphatic: There is advanced aortoiliac atherosclerotic disease. There is a fusiform infrarenal abdominal aortic aneurysm measuring up to 3.6 cm in transverse diameter and approximately 4 cm in length. The IVC appears unremarkable. No portal  venous gas identified. There is no adenopathy. Reproductive: Probable small calcified uterine granuloma. The ovaries are  grossly unremarkable as visualized. Other: None Musculoskeletal: Osteopenia with degenerative changes of the spine. Grade 1 L3-L4 and L4-L5 anterolisthesis. L2-L3 disc desiccation with vacuum phenomena. No acute fracture. IMPRESSION: 1. No acute intra-abdominopelvic pathology. No evidence of bowel obstruction or active inflammation. Normal appendix. 2. Extensive sigmoid diverticulosis. 3. Scattered pancreatic calcifications sequela of chronic pancreatitis. Correlation with pancreatic enzymes recommended to exclude acute on chronic pancreatitis. 4. Atrophic left kidney.  Unremarkable right kidney. 5. A 1 cm left adrenal indeterminate nodule, possibly an adenoma. 6. Advanced aortic atherosclerosis with a 3.6 cm fusiform infrarenal abdominal aortic aneurysm. Recommend followup by ultrasound in 2 years. This recommendation follows ACR consensus guidelines: White Paper of the ACR Incidental Findings Committee II on Vascular Findings. J Am Coll Radiol 2013; 10:789-794. Electronically Signed   By: Anner Crete M.D.   On: 11/25/2016 02:52   US Abdomen Limited Ruq  Result Date: 11/25/2016 CLINICAL DATA:  Abdominal pain for 1 week EXAM: US ABDOMEN LIMITED - RIGHT UPPER QUADRANT COMPARISON:  None. FINDINGS: Gallbladder: No shadowing stones. Gallbladder polyp measuring up to 3 mm. Wall thickness within normal limits. Negative sonographic Murphy Common bile duct: Diameter: 3.7 mm Liver: No focal lesion identified. Within normal limits in parenchymal echogenicity. IMPRESSION: 1. Gallbladder polyp measuring up to 3 mm. No sonographic evidence for acute cholecystitis. 2. No biliary dilatation Electronically Signed   By: Donavan Foil M.D.   On: 11/25/2016 00:14    Microbiology: Recent Results (from the past 240 hour(s))  Urine culture     Status: Abnormal   Collection Time: 12/09/16  6:42 PM   Result Value Ref Range Status   Specimen Description URINE, RANDOM  Final   Special Requests NONE  Final   Culture MULTIPLE SPECIES PRESENT, SUGGEST RECOLLECTION (A)  Final   Report Status 12/11/2016 FINAL  Final     Labs: Basic Metabolic Panel:  Recent Labs Lab 12/09/16 1905 12/10/16 0520 12/11/16 0550 12/12/16 0805  NA 126* 129* 129* 128*  K 4.5 4.6 4.7 4.7  CL 90* 95* 95* 92*  CO2 26 27 27 28   GLUCOSE 98 83 83 88  BUN 43* 43* 35* 29*  CREATININE 1.14* 1.25* 1.22* 1.14*  CALCIUM 9.4 8.9 8.9 9.3  MG 1.9  --   --   --    Liver Function Tests:  Recent Labs Lab 12/09/16 1905  AST 22  ALT 16  ALKPHOS 58  BILITOT 0.2*  PROT 7.4  ALBUMIN 4.0   No results for input(s): LIPASE, AMYLASE in the last 168 hours. No results for input(s): AMMONIA in the last 168 hours. CBC:  Recent Labs Lab 12/09/16 1905 12/10/16 0520  WBC 10.9* 8.3  NEUTROABS 8.6*  --   HGB 10.7* 9.1*  HCT 31.7* 26.9*  MCV 91.1 91.5  PLT 475* 436*   Cardiac Enzymes: No results for input(s): CKTOTAL, CKMB, CKMBINDEX, TROPONINI in the last 168 hours. BNP: BNP (last 3 results) No results for input(s): BNP in the last 8760 hours.  ProBNP (last 3 results) No results for input(s): PROBNP in the last 8760 hours.  CBG:  Recent Labs Lab 12/10/16 0735 12/11/16 0745 12/12/16 0819  GLUCAP 91 87 82       Signed:  Nita Sells MD   [facility] 12/12/2016, 11:28 AM

## 2016-12-13 ENCOUNTER — Telehealth: Payer: Self-pay | Admitting: Vascular Surgery

## 2016-12-13 LAB — PROTEIN ELECTROPHORESIS, SERUM
A/G Ratio: 1.3 (ref 0.7–1.7)
ALBUMIN ELP: 3 g/dL (ref 2.9–4.4)
ALPHA-1-GLOBULIN: 0.2 g/dL (ref 0.0–0.4)
ALPHA-2-GLOBULIN: 0.6 g/dL (ref 0.4–1.0)
BETA GLOBULIN: 0.9 g/dL (ref 0.7–1.3)
GAMMA GLOBULIN: 0.6 g/dL (ref 0.4–1.8)
Globulin, Total: 2.3 g/dL (ref 2.2–3.9)
Total Protein ELP: 5.3 g/dL — ABNORMAL LOW (ref 6.0–8.5)

## 2016-12-13 NOTE — Progress Notes (Signed)
Pt's daughter [name] called today to ask questions about labs and [facility]. Daughter feels pt do not need HH and will cancel with [facility]. Daughter also state that she will take pt to PCP for labs. Daughter [name] was called twice on 5/14 before pt was discharged, left VM with no return call from her(dtg).

## 2016-12-13 NOTE — Telephone Encounter (Signed)
-----   Message from Denman George, RN sent at 12/13/2016 12:15 PM EDT ----- Regarding: needs appt with any MD  Per Dr. Donnetta Hutching, this pt. needs an appt. with any MD to evaluate hx Renal Artery Stenosis and Hyponatremia Syndrome; no vascular labs are needed initially.

## 2016-12-13 NOTE — Telephone Encounter (Signed)
Sched appt 12/16/16 at 3:30 w/ [facility]. Spoke to pt's son to sch appt.

## 2016-12-16 ENCOUNTER — Encounter: Payer: Self-pay | Admitting: Vascular Surgery

## 2016-12-16 ENCOUNTER — Ambulatory Visit (INDEPENDENT_AMBULATORY_CARE_PROVIDER_SITE_OTHER): Payer: Medicare Other | Admitting: Vascular Surgery

## 2016-12-16 VITALS — BP 146/80 | HR 87 | Temp 97.7°F | Resp 18 | Ht 59.0 in | Wt 78.0 lb

## 2016-12-16 DIAGNOSIS — I714 Abdominal aortic aneurysm, without rupture, unspecified: Secondary | ICD-10-CM

## 2016-12-16 DIAGNOSIS — I701 Atherosclerosis of renal artery: Secondary | ICD-10-CM | POA: Diagnosis not present

## 2016-12-16 NOTE — Progress Notes (Signed)
Patient ID: Kristina [name], female   DOB: September 21, 1925, 83 y.o.   MRN: [id number]  Reason for Consult: New Evaluation (eval hx renal art stenosis and hyponatremia)   Referred by [name], MD  Subjective:     HPI:  DOTSIE [name] is a 83 y.o. female presents for evaluation of hyponatremia and weight loss. She has known a genic left kidney and stenosis of the right renal artery. She was recently hospitalized with hyponatremia sodium down to 120. Yesterday was checked and was 130lb. She does have hypertension but is well-controlled. She has not had pulmonary edema and does not have worsening renal function at this time. CT scanning demonstrated significant calcification of her entire aorta with a aortic segment 3 cm in size. She is now eating and gaining some weight back. She does not have any pain or food fear at this time. She has never had a stroke or coronary event. She does not have any issues with walking. She does have a small wound on her left ankle from a low-grade trauma. She is a former smoker but quit many years ago. She does not take aspirin at this time.  Past Medical History:  Diagnosis Date  . AAA (abdominal aortic aneurysm) (HCC)    Mild - measuring 2.8x2.8cm  . Acute hyponatremia 11/2016  . 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 HOH (hard of hearing)   . Hyperlipidemia    Well-controlled  . Hypertension   . PAD (peripheral artery disease) (HCC)    LEA Dopplers: Right external iliac 40%, right mid SFA 70-99% with occlusion in Hunter's canal. Angiography. LEFT common iliac 60%, left SFA 70-9%, popliteal 50%; RIGHT posterior tibial occluded;; carotid Dopplers in 2009: Less than 50% stenosis.  . Renal artery stenosis (HCC)    Family History  Problem Relation Age of Onset  . Heart attack Brother   . Cancer Brother        Bone cancer   Past Surgical History:  Procedure Laterality  Date  . LOWER EXTREMITY ARTERIAL DOPPLER  05/30/2011   Right EIA->50% daimeter reduction, Rt SFA 70-99% diameter reduction, Rt SFA-occlusive disease at Hunter's canal, Rt PTA-appeared occluded, Lft CIA-moderate amount of calcific plaque w/ >60% diameter reduction, Lft prox SFA 70-99% diameter reduction, Lft POP-large amount of irregular mixed suggesting <50% diameter reduction  . NM MYOVIEW LTD  06/25/2008   No scintigraphic evidence of inducible myocardial ischemia  . PERIPHERAL VASCULAR ANGIOGRAM  07/13/2009   No intervention - "plan on doing staged intervention in near future"  . TRANSTHORACIC ECHOCARDIOGRAM  11/14/2007   EF >55%, mild mitral and tricuspid regurg.    Short Social History:  Social History  Substance Use Topics  . Smoking status: Former Smoker    Types: Cigarettes    Quit date: 07/11/2001  . Smokeless tobacco: Never Used  . Alcohol use 0.6 oz/week    1 Glasses of wine per week    Allergies  Allergen Reactions  . Lipitor [Atorvastatin] Other (See Comments)    Reaction:  Unknown   . Pletal [Cilostazol] Other (See Comments)    Reaction:  Unknown     Current Outpatient Prescriptions  Medication Sig Dispense Refill  . furosemide (LASIX) 20 MG tablet Take 1 tablet (20 mg total) by mouth every other day. 30 tablet 1  . hydrALAZINE (APRESOLINE) 25 MG tablet Take 1 tablet (25 mg total) by mouth 2 (  two) times daily. 30 tablet 0  . meclizine (ANTIVERT) 12.5 MG tablet Take 1 tablet (12.5 mg total) by mouth 2 (two) times daily. 30 tablet 0  . Probiotic Product (ALIGN) 4 MG CAPS Take 4 mg by mouth at bedtime.     . simvastatin (ZOCOR) 10 MG tablet Take 1 tablet (10 mg total) by mouth at bedtime. 30 tablet 11  . aspirin EC 325 MG tablet Take 325 mg by mouth at bedtime.      No current facility-administered medications for this visit.     Review of Systems  Constitutional: Positive for unexpected weight change.  Eyes: Eyes negative.  Respiratory: Respiratory negative.    GI: Gastrointestinal negative.  Musculoskeletal: Musculoskeletal negative.  Skin: Positive for wound.  Neurological: Neurological negative. Hematologic: Hematologic/lymphatic negative.  Psychiatric: Psychiatric negative.        Objective:  Objective   Vitals:   12/16/16 1532 12/16/16 1533  BP: (!) 145/80 (!) 146/80  Pulse: 87   Resp: 18   Temp: 97.7 F (36.5 C)   TempSrc: Oral   SpO2: 96%   Weight: 78 lb (35.4 kg)   Height: 4\' 11"  (1.499 m)    Body mass index is 15.75 kg/m.  Physical Exam  Constitutional: She is oriented to person, place, and time. She appears well-developed.  HENT:  Head: Normocephalic.  Eyes: Pupils are equal, round, and reactive to light.  Neck: Normal range of motion.  Cardiovascular: Normal rate.   Pulses:      Femoral pulses are 2+ on the right side, and 2+ on the left side. Monophasic tibial vessels at the ankle  Pulmonary/Chest: Effort normal.  Abdominal: Soft. She exhibits mass.  Musculoskeletal: Normal range of motion. She exhibits no edema.  Neurological: She is alert and oriented to person, place, and time.  Skin: Skin is warm.  1cm wound on left medial leg  Psychiatric: She has a normal mood and affect. Her behavior is normal. Judgment and thought content normal.    Data: IMPRESSION: 1. No acute intra-abdominopelvic pathology. No evidence of bowel obstruction or active inflammation. Normal appendix. 2. Extensive sigmoid diverticulosis. 3. Scattered pancreatic calcifications sequela of chronic pancreatitis. Correlation with pancreatic enzymes recommended to exclude acute on chronic pancreatitis. 4. Atrophic left kidney.  Unremarkable right kidney. 5. A 1 cm left adrenal indeterminate nodule, possibly an adenoma. 6. Advanced aortic atherosclerosis with a 3.6 cm fusiform infrarenal abdominal aortic aneurysm. Recommend followup by ultrasound in 2 years. This recommendation follows ACR consensus guidelines: White Paper of the ACR  Incidental Findings Committee II on Vascular Findings. J Am Coll Radiol 2013; 10:789-794.     Assessment/Plan:     83 year old female here for evaluation of his enteric and renal artery stenosis. She only has one functional kidney who has previous duplex performed in 8144 with peak systolic velocity 818. Recent CT scan demonstrated significant calcification near her renal artery as well as in aortic aneurysm that is 3.6 cm at the same site. She does not appear to have significant calcification of her mesenteric arteries. At this time her hyponatremia appears to be improving as is her appetite. I will get a renal artery duplex as well as mesenteric duplex and have her follow-up in 4-6 weeks. At that time if she continues to improve she would not need any intervention but if she is still failing medical management and has high-grade stenoses we could consider angiogram at least for diagnostic purposes. Family demonstrates good understanding and will see her in 4-6  weeks.     Waynetta Sandy MD [facility]

## 2016-12-19 NOTE — Addendum Note (Signed)
Addended by: Lianne Cure A on: 12/19/2016 03:17 PM   Modules accepted: Orders

## 2017-01-11 ENCOUNTER — Ambulatory Visit: Payer: BLUE CROSS/BLUE SHIELD | Admitting: Neurology

## 2017-01-27 ENCOUNTER — Ambulatory Visit (INDEPENDENT_AMBULATORY_CARE_PROVIDER_SITE_OTHER): Payer: Medicare Other | Admitting: Cardiology

## 2017-01-27 ENCOUNTER — Encounter: Payer: Self-pay | Admitting: Cardiology

## 2017-01-27 VITALS — BP 150/84 | HR 77 | Ht 59.0 in | Wt 80.0 lb

## 2017-01-27 DIAGNOSIS — I10 Essential (primary) hypertension: Secondary | ICD-10-CM | POA: Diagnosis not present

## 2017-01-27 DIAGNOSIS — E785 Hyperlipidemia, unspecified: Secondary | ICD-10-CM | POA: Diagnosis not present

## 2017-01-27 DIAGNOSIS — I701 Atherosclerosis of renal artery: Secondary | ICD-10-CM | POA: Diagnosis not present

## 2017-01-27 DIAGNOSIS — I714 Abdominal aortic aneurysm, without rupture, unspecified: Secondary | ICD-10-CM

## 2017-01-27 DIAGNOSIS — I70219 Atherosclerosis of native arteries of extremities with intermittent claudication, unspecified extremity: Secondary | ICD-10-CM

## 2017-01-27 NOTE — Progress Notes (Signed)
PCP: [name], MD  Clinic Note: Chief Complaint  Patient presents with  . Follow-up    post hospital   . PAD    Cardiac risk factors  . Hypertension    HPI: Kristina [name] is a 83 y.o. female with a PMH below who presents today for Routine follow-up yet also hospital follow-up. She has a history of difficult control hypertension, PAD, AAA and hyperlipidemia.Kristina [name] was last seen on 06/27/2016 - she was doing well, without any CV complaints - stable claudication, but no angina.  She did not really want to go for more tests (did not show for arterial dopplers).  Says that arthritis bothers her more than claudication.  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 She also had nonintentional weight loss. She noted that pancreatic calcification without pancreatitis plan to follow up with vascular surgery. Seen by Dr. Donzetta Matters from vascular surgery on May 18 as part of her evaluation given her known renal artery stenosis. -> Renal artery duplex ordered long mesentery duplex, however he did not feel that any invasive evaluation be necessary. Her sodium was improving.  Lab Results  Component Value Date   CREATININE 1.14 (H) 12/12/2016   BUN 29 (H) 12/12/2016   NA 128 (L) 12/12/2016   K 4.7 12/12/2016   CL 92 (L) 12/12/2016   CO2 28 12/12/2016    Studies Personally Reviewed - (if available, images/films reviewed: From Epic Chart or Care Everywhere)  none  Interval History: Kristina [name] presents today early for her routine f/u as part of hospital f/u.  She is actually feeling much better since her hospitalization. She is gaining back weight now eating better. She is starting to get out and about again now. She's not having any significant claudication symptoms with the amount of exercise that she is doing. She went to see Dr. Donzetta Matters from vascular surgery, and  is wondering if she needs to have the studies done that were ordered.  She still indicates that her arthritis bothers her more than claudication does, but still does have claudication having to stop a couple times when she walks in her driveway. She is just now would be to start getting back into doing that now, so she gets a little bit tired as well. Overall she is in great spirits and denies any cardiac symptoms of chest pain or pressure with rest or exertion. She has chronic exertional dyspnea but has not changed.   No PND, orthopnea or edema. No palpitations, lightheadedness, dizziness, weakness or syncope/near syncope. No TIA/amaurosis fugax symptoms.  ROS: A comprehensive was performed. Review of Systems  Constitutional: Negative for malaise/fatigue.  HENT: Negative for congestion and nosebleeds.   Respiratory: Negative for cough, shortness of breath (Baseline) and wheezing.   Cardiovascular: Positive for claudication (Stable).  Gastrointestinal: Negative for blood in stool and melena.  Genitourinary: Negative for hematuria.  Musculoskeletal: Positive for joint pain (Knees and hips).  Neurological: Negative for dizziness (Unless she stands up quickly).  Psychiatric/Behavioral: Negative for depression and memory loss. The patient is not nervous/anxious.   All other systems reviewed and are negative.   I have reviewed and (if needed) personally updated the patient's problem list, medications, allergies, past medical and surgical history, social and family history.   Past Medical History:  Diagnosis Date  . AAA (abdominal aortic aneurysm) (HCC)    Mild - measuring 2.8x2.8cm  .  Acute hyponatremia 11/2016  . 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 HOH (hard of [name])   . Hyperlipidemia    Well-controlled  . Hypertension   . PAD (peripheral artery disease) (HCC)    LEA Dopplers: Right external iliac  40%, right mid SFA 70-99% with occlusion in Hunter's canal. Angiography. LEFT common iliac 60%, left SFA 70-9%, popliteal 50%; RIGHT posterior tibial occluded;; carotid Dopplers in 2009: Less than 50% stenosis.  . Renal artery stenosis [facility])     Past Surgical History:  Procedure Laterality Date  . LOWER EXTREMITY ARTERIAL DOPPLER  05/30/2011   Right EIA->50% daimeter reduction, Rt SFA 70-99% diameter reduction, Rt SFA-occlusive disease at Hunter's canal, Rt PTA-appeared occluded, Lft CIA-moderate amount of calcific plaque w/ >60% diameter reduction, Lft prox SFA 70-99% diameter reduction, Lft POP-large amount of irregular mixed suggesting <50% diameter reduction  . NM MYOVIEW LTD  06/25/2008   No scintigraphic evidence of inducible myocardial ischemia  . PERIPHERAL VASCULAR ANGIOGRAM  07/13/2009   No intervention - "plan on doing staged intervention in near future"  . TRANSTHORACIC ECHOCARDIOGRAM  11/14/2007   EF >55%, mild mitral and tricuspid regurg.    Current Meds  Medication Sig  . aspirin EC 325 MG tablet Take 325 mg by mouth at bedtime.   . furosemide (LASIX) 20 MG tablet Take 1 tablet (20 mg total) by mouth every other day.  . hydrALAZINE (APRESOLINE) 25 MG tablet Take 1 tablet (25 mg total) by mouth 2 (two) times daily.  . meclizine (ANTIVERT) 12.5 MG tablet Take 1 tablet (12.5 mg total) by mouth 2 (two) times daily.  . Probiotic Product (ALIGN) 4 MG CAPS Take 4 mg by mouth at bedtime.   . simvastatin (ZOCOR) 10 MG tablet Take 1 tablet (10 mg total) by mouth at bedtime.    Allergies  Allergen Reactions  . Lipitor [Atorvastatin] Other (See Comments)    Reaction:  Unknown   . Pletal [Cilostazol] Other (See Comments)    Reaction:  Unknown     Social History   Social History  . Marital status: Divorced    Spouse name: N/A  . Number of children: N/A  . Years of education: N/A   Social History Main Topics  . Smoking status: Former Smoker    Types: Cigarettes    Quit  date: 07/11/2001  . Smokeless tobacco: Never Used  . Alcohol use 0.6 oz/week    1 Glasses of wine per week  . Drug use: No  . Sexual activity: Not Asked   Other Topics Concern  . None   Social History Narrative   Single mother of 2, grandmother 1. Tries to exercise when she can, but is limited by claudication.   Former smoker who quit in the early 2000s.   Drinks occasional glass of wine    family history includes Cancer in her brother; Heart attack in her brother.  Wt Readings from Last 3 Encounters:  01/27/17 80 lb (36.3 kg)  12/16/16 78 lb (35.4 kg)  12/12/16 77 lb 2.6 oz (35 kg)    PHYSICAL EXAM BP (!) 150/84 (BP Location: Right Arm)   Pulse 77   Ht 4\' 11"  (1.499 m)   Wt 80 lb (36.3 kg)   BMI 16.16 kg/m  General appearance: alert, cooperative, appears stated age, no distress. She is very thin and frail, but looks vibrant and well-groomed. HEENT: [place]/AT, EOMI, MMM,  anicteric sclera Neck: no adenopathy, soft bilateral carotid bruit, and no JVD Lungs: clear to auscultation bilaterally, normal percussion bilaterally and non-labored Heart: RRR with normal S1 and S2. Soft 1/6 SEM at RUSB with faint HSM at apex. No other M/R/G. Abdomen: soft, non-tender; bowel sounds normal; no masses,  no organomegaly; no HJR Extremities: extremities normal, atraumatic, no cyanosis, or edema  Pulses: 2+ and symmetric;  Skin: mobility and turgor normal, no evidence of bleeding or bruising and no lesions noted - usual post hospital bruising Neurologic: Mental status: Alert & oriented x 3, thought content appropriate; non-focal exam.  Pleasant mood & affect.   Adult ECG Report  Rate: 77 ;  Rhythm: normal sinus rhythm and LAD (-32), lvh;   Narrative Interpretation: Stable EKG   Other studies Reviewed: Additional studies/ records that were reviewed today include:  Recent Labs:   Lab Results  Component Value Date   CREATININE 1.14 (H) 12/12/2016   BUN 29 (H) 12/12/2016   NA 128 (L)  12/12/2016   K 4.7 12/12/2016   CL 92 (L) 12/12/2016   CO2 28 12/12/2016   Lab Results  Component Value Date   CHOL 159 12/02/2016   HDL 57 12/02/2016   LDLCALC 88 12/02/2016   TRIG 71 12/02/2016   CHOLHDL 2.8 12/02/2016    ASSESSMENT / PLAN: Problem List Items Addressed This Visit    AAA (abdominal aortic aneurysm) ([place]) (Chronic)    He is a stable readings. Dr. Nyra Jabs ordered abdominal Dopplers with evaluation of her mesenteric and renal arteries. She will be due to have her abdominal aorta evaluated, the nasal evaluate renal arteries and mesenteric arteries. There was concern about possible renal artery stenosis related hyponatremia. Depending on the rails of the studies, we may or may not consider continued follow-up.      Atherosclerotic PVD with intermittent claudication - near occlusive SFA disease with moderate iliac disease (Chronic)    Stable nonlimiting claudication. No critical limb ischemia. She has declined evaluation with repeat Dopplers because she still not interested in invasive evaluation. Continue aspirin, statin.--> Reduce aspirin to 81 mg Continue walking.  She actually has been seen now by Dr. Donzetta Matters from vascular surgery. -> In the past, she had been reluctant to have invasive evaluation based on interactions with the performing physician. She may be interested in the future if necessary to be evaluated by Dr. Donzetta Matters.      Relevant Orders   EKG 12-Lead   Essential hypertension (Chronic)    She is also taking low-dose hydralazine, her blood pressures low bit elevated today, but has usually been well controlled. I want her to keep an eye on her pressures, and if he goes above 160/90, she can take an additional dose of hydralazine, otherwise she is using thinning 25 mg twice a day hydralazine and when necessary Lasix.      Relevant Orders   EKG 12-Lead   Hyperlipidemia with target low density lipoprotein (LDL) cholesterol less than 70 mg/dL (Chronic)    On  simvastatin without myalgias. Labs followed by PCP. Most recent labs look pretty good in May.      Relevant Orders   EKG 12-Lead      Current medicines are reviewed at length with the patient today. (+/- concerns) n/a The following changes have been made: n/a  Patient Instructions  No changes  Continue with medications -- if blood pressure is greater than 160/90 you may take an extra dose of Hydralazine.   Do doppler studies  and follow up with Dr Donzetta Matters   Your physician recommends that you schedule a follow-up appointment in [place] 2018 [place].    Studies Ordered:   Orders Placed This Encounter  Procedures  . EKG 12-Lead      Glenetta Hew, M.D., M.S. Interventional Cardiologist   Pager # [phone] Phone # [phone] [address] [place], [place] [address]

## 2017-01-27 NOTE — Patient Instructions (Signed)
No changes  Continue with medications -- if blood pressure is greater than 160/90 you may take an extra dose of Hydralazine.   Do doppler studies and follow up with Dr Donzetta Matters   Your physician recommends that you schedule a follow-up appointment in [place] 2018 [place].

## 2017-01-30 ENCOUNTER — Encounter: Payer: Self-pay | Admitting: Cardiology

## 2017-01-30 NOTE — Assessment & Plan Note (Signed)
On simvastatin without myalgias. Labs followed by PCP. Most recent labs look pretty good in May.

## 2017-01-30 NOTE — Assessment & Plan Note (Signed)
She is also taking low-dose hydralazine, her blood pressures low bit elevated today, but has usually been well controlled. I want her to keep an eye on her pressures, and if he goes above 160/90, she can take an additional dose of hydralazine, otherwise she is using thinning 25 mg twice a day hydralazine and when necessary Lasix.

## 2017-01-30 NOTE — Assessment & Plan Note (Addendum)
Stable nonlimiting claudication. No critical limb ischemia. She has declined evaluation with repeat Dopplers because she still not interested in invasive evaluation. Continue aspirin, statin.--> Reduce aspirin to 81 mg Continue walking.  She actually has been seen now by Dr. Donzetta Matters from vascular surgery. -> In the past, she had been reluctant to have invasive evaluation based on interactions with the performing physician. She may be interested in the future if necessary to be evaluated by Dr. Donzetta Matters.

## 2017-01-30 NOTE — Assessment & Plan Note (Signed)
He is a stable readings. Dr. Nyra Jabs ordered abdominal Dopplers with evaluation of her mesenteric and renal arteries. She will be due to have her abdominal aorta evaluated, the nasal evaluate renal arteries and mesenteric arteries. There was concern about possible renal artery stenosis related hyponatremia. Depending on the rails of the studies, we may or may not consider continued follow-up.

## 2017-02-10 ENCOUNTER — Encounter (HOSPITAL_COMMUNITY): Payer: Medicare Other

## 2017-02-10 ENCOUNTER — Ambulatory Visit: Payer: Medicare Other | Admitting: Vascular Surgery

## 2017-05-11 ENCOUNTER — Telehealth: Payer: Self-pay | Admitting: *Deleted

## 2017-05-11 NOTE — Telephone Encounter (Signed)
Spoke to patient. Result given . Verbalized understanding   patient states Dr Justin Mend just increased hydralazine to 50 mg twice a day. After reviewing labs. Patient wanted to know what Dr Ellyn Hack thinks before following Dr Justin Mend instructions. SHE STATES  SHE  HAS NOT HAD ANY other labs since the one on 9/181/18.   patient aware will defer to Dr Ellyn Hack.

## 2017-05-11 NOTE — Telephone Encounter (Signed)
-----   Message from Leonie Man, MD sent at 04/27/2017  5:39 PM EDT ----- Recent Labs: 04/18/2017 Na+ --, K+ 4.5, Cl- 99, HCO3- 28 , BUN 30, Cr 1.7, Glu 86, Ca2+ 9.7; AST 20, ALT, 17 AlkP 57 CBC: W 7.4, H/H 11.6/36.1. Plt 425 TC 185, TG 82, HDL 76, LDL 93.   -- Basically labs seem to be relatively stable with the exception affect the kidney function seems to be a little bit worse. Looks a little bit on the dehydrated side.  Labs were from PCP.  Glenetta Hew, MD

## 2017-05-14 NOTE — Telephone Encounter (Signed)
I am fine with increasing hydralazine  Glenetta Hew, MD

## 2017-05-15 NOTE — Telephone Encounter (Signed)
Patient aware , change medication list

## 2017-07-17 ENCOUNTER — Ambulatory Visit (INDEPENDENT_AMBULATORY_CARE_PROVIDER_SITE_OTHER): Payer: Medicare Other | Admitting: Cardiology

## 2017-07-17 ENCOUNTER — Encounter: Payer: Self-pay | Admitting: Cardiology

## 2017-07-17 VITALS — BP 134/70 | HR 82 | Ht 59.0 in | Wt 80.0 lb

## 2017-07-17 DIAGNOSIS — I10 Essential (primary) hypertension: Secondary | ICD-10-CM | POA: Diagnosis not present

## 2017-07-17 DIAGNOSIS — I714 Abdominal aortic aneurysm, without rupture, unspecified: Secondary | ICD-10-CM

## 2017-07-17 DIAGNOSIS — I70219 Atherosclerosis of native arteries of extremities with intermittent claudication, unspecified extremity: Secondary | ICD-10-CM

## 2017-07-17 DIAGNOSIS — E785 Hyperlipidemia, unspecified: Secondary | ICD-10-CM | POA: Diagnosis not present

## 2017-07-17 DIAGNOSIS — I701 Atherosclerosis of renal artery: Secondary | ICD-10-CM | POA: Diagnosis not present

## 2017-07-17 DIAGNOSIS — R0989 Other specified symptoms and signs involving the circulatory and respiratory systems: Secondary | ICD-10-CM | POA: Diagnosis not present

## 2017-07-17 MED ORDER — COQ10 100 MG PO CAPS: 100.0000 mg | ORAL_CAPSULE | Freq: Three times a day (TID) | ORAL | 3 refills | Status: AC

## 2017-07-17 NOTE — Patient Instructions (Addendum)
SCHEDULE [place] has requested that you have a carotid duplex. This test is an ultrasound of the carotid arteries in your neck. It looks at blood flow through these arteries that supply the brain with blood. Allow one hour for this exam. There are no restrictions or special instructions.  AND Your physician has requested that you have an abdominal aorta duplex. During this test, an ultrasound is used to evaluate the aorta. Allow 30 minutes for this exam. Do not eat after midnight the day before and avoid carbonated beverages AND  Your physician has requested that you have a lower extremity arterial duplex. This test is an ultrasound of the arteries in the legs. It looks at arterial blood flow in the legs. Allow one hour for Lower Arterial scans. There are no restrictions or special instructions   MEDICATION INSTRUCTION  ----STOP SIMVASTATIN  - START CoQ10 100  One tablet for one week at breakfast, then added another tablet to meal each week for week until you are taking one tablet 3 times a day.  MAY TAKE  GENERIC TYLENOL FOR PAIN  AND USE TYLENOL -PM FOR SLEEP , PAIN   Your physician wants you to follow-up in Feb 2019 with Dr Ellyn Hack.You will receive a reminder letter in the mail two months in advance. If you don't receive a letter, please call our office to schedule the follow-up appointment.

## 2017-07-17 NOTE — Progress Notes (Signed)
PCP: [name], MD  Clinic Note: Chief Complaint  Patient presents with  . Follow-up    Diffuse bilateral arm and leg pain.  Left arm worse.  Marland Kitchen PAD  . AAA    HPI: Kristina [name] is a 83 y.o. female with a PMH below who presents today for Routine 6 month follow-up yet also hospital follow-up. She has a history of difficult control hypertension, PAD, AAA and hyperlipidemia.. May 2019: Noted to have hyponatremia concerning for SIADH (sodium down to 126). 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 She also had nonintentional weight loss. She noted that pancreatic calcification without pancreatitis plan to follow up with vascular surgery. Seen by Dr. Donzetta Matters from vascular surgery on May 18 as part of her evaluation given her known renal artery stenosis. -> Renal artery duplex ordered long mesentery duplex, however he did not feel that any invasive evaluation be necessary. Her sodium was improving.  Kristina [name] was last seen in June 2018 - she was doing well, without any CV complaints - stable claudication, but no angina.  She did not really want to go for more tests (did not show for arterial dopplers).  Says that arthritis bothers her more than claudication.  Recent Hospitalizations: none  Studies Personally Reviewed - (if available, images/films reviewed: From Epic Chart or Care Everywhere)  none  Interval History: Kristina [name] presents today early for her routine f/u, and for the first time, she is not "all smiles "and feeling well.  She actually says that she just aches all over.  Her legs and arms mostly her left he upper arm are constantly hurting.  Using her left arm at all is painful and raising her arms above her head is painful.  She can barely walk around because her legs ache.  She is not had any cardiac symptoms of chest tightness or pressure at rest or exertion.  No resting or exertional dyspnea, mostly because she is not really been doing any exertion.   No PND, orthopnea or edema.  No rapid irregular heartbeats palpitations.  No syncope/near syncope or TIA/amaurosis fugax.  As noted before, she had claudication symptoms having to stop several times simply walking up and down her driveway, we had talked about having her lower externally arterial Dopplers done, but twice they were ordered and she did not follow through with having her vascular Dopplers performed.  She now has been seen by Dr. Donzetta Matters and vascular surgery and still did not have studies done.  Unfortunately now she is noting bilateral leg and left upper arm pain and is worried that her arteries have gone bad.  ROS: A comprehensive was performed. Review of Systems  Constitutional: Positive for malaise/fatigue (Completely exhausted.).  HENT: Negative for congestion and nosebleeds.   Respiratory: Negative for cough, shortness of breath (Baseline) and wheezing.   Cardiovascular: Claudication: Stable.  Gastrointestinal: Negative for blood in stool and melena.  Genitourinary: Negative for hematuria.  Musculoskeletal: Positive for joint pain (Knees and hips) and myalgias (Bilateral arms and legs.  Left arm is worse).  Neurological: Negative for dizziness (Unless she stands up quickly).  Psychiatric/Behavioral: Negative for depression and memory loss. The patient is not nervous/anxious.   All other systems reviewed and are negative.   I have reviewed and (if needed) personally updated the patient's problem list, medications, allergies, past medical and surgical history, social and family history.   Past Medical History:  Diagnosis Date  . AAA (abdominal aortic aneurysm) ([place])  Mild - measuring 2.8x2.8cm  . Acute hyponatremia 11/2016  . 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 HOH (hard of hearing)   . Hyperlipidemia    Well-controlled  . Hypertension   . PAD (peripheral artery disease) (HCC)     LEA Dopplers: Right external iliac 40%, right mid SFA 70-99% with occlusion in Hunter's canal. Angiography. LEFT common iliac 60%, left SFA 70-9%, popliteal 50%; RIGHT posterior tibial occluded;; carotid Dopplers in 2009: Less than 50% stenosis.  . Renal artery stenosis [facility])     Past Surgical History:  Procedure Laterality Date  . LOWER EXTREMITY ARTERIAL DOPPLER  05/30/2011   Right EIA->50% daimeter reduction, Rt SFA 70-99% diameter reduction, Rt SFA-occlusive disease at Hunter's canal, Rt PTA-appeared occluded, Lft CIA-moderate amount of calcific plaque w/ >60% diameter reduction, Lft prox SFA 70-99% diameter reduction, Lft POP-large amount of irregular mixed suggesting <50% diameter reduction  . NM MYOVIEW LTD  06/25/2008   No scintigraphic evidence of inducible myocardial ischemia  . PERIPHERAL VASCULAR ANGIOGRAM  07/13/2009   No intervention - "plan on doing staged intervention in near future"  . TRANSTHORACIC ECHOCARDIOGRAM  11/14/2007   EF >55%, mild mitral and tricuspid regurg.    Current Meds  Medication Sig  . aspirin 81 MG tablet Take 81 mg by mouth daily.  Mariane Baumgarten Sodium (COLACE PO) Take 1 tablet by mouth daily.  . hydrALAZINE (APRESOLINE) 50 MG tablet Take 50 mg by mouth 2 (two) times daily.  . Probiotic Product (ALIGN) 4 MG CAPS Take 4 mg by mouth at bedtime.   . [DISCONTINUED] simvastatin (ZOCOR) 10 MG tablet Take 1 tablet (10 mg total) by mouth at bedtime.    Allergies  Allergen Reactions  . Lipitor [Atorvastatin] Other (See Comments)    Reaction:  Unknown   . Pletal [Cilostazol] Other (See Comments)    Reaction:  Unknown   . Simvastatin     MYALGIAS    Social History   Socioeconomic History  . Marital status: Divorced    Spouse name: None  . Number of children: None  . Years of education: None  . Highest education level: None  Social Needs  . Financial resource strain: None  . Food insecurity - worry: None  . Food insecurity - inability: None  .  Transportation needs - medical: None  . Transportation needs - non-medical: None  Occupational History  . None  Tobacco Use  . Smoking status: Former Smoker    Types: Cigarettes    Last attempt to quit: 07/11/2001    Years since quitting: 16.0  . Smokeless tobacco: Never Used  Substance and Sexual Activity  . Alcohol use: Yes    Alcohol/week: 0.6 oz    Types: 1 Glasses of wine per week  . Drug use: No  . Sexual activity: None  Other Topics Concern  . None  Social History Narrative   Single mother of 2, grandmother 1. Tries to exercise when she can, but is limited by claudication.   Former smoker who quit in the early 2000s.   Drinks occasional glass of wine    family history includes Cancer in her brother; Heart attack in her brother.  Wt Readings from Last 3 Encounters:  07/17/17 80 lb (36.3 kg)  01/27/17 80 lb (36.3 kg)  12/16/16 78 lb (35.4 kg)    PHYSICAL EXAM BP 134/70   Pulse 82   Ht  4\' 11"  (1.499 m)   Wt 80 lb (36.3 kg)   BMI 16.16 kg/m   Physical Exam  Constitutional:  Thin, frail, borderline cachectic elderly woman.  She does not appear in any acute distress, but is chronically ill-appearing.  She is as usual always well-groomed.  HENT:  Head: Normocephalic and atraumatic.  Eyes: EOM are normal.  Neck: Normal carotid pulses, no hepatojugular reflux and no JVD present. Carotid bruit is present (Soft bilateral). No thyromegaly present.  Cardiovascular: Regular rhythm.  Occasional extrasystoles are present. PMI is not displaced (Somewhat hyperdynamic precordium.  Mostly because there is minimal muscle or fat.). Exam reveals no gallop, no S4, no distant heart sounds and no friction rub.  Murmur heard.  Medium-pitched harsh crescendo-decrescendo early systolic murmur is present with a grade of 1/6 at the upper right sternal border radiating to the neck. Pulses:      Carotid pulses are 2+ on the right side, and 2+ on the left side.      Radial pulses are 2+ on  the right side, and 2+ on the left side.       Femoral pulses are 1+ on the right side, and 1+ on the left side.      Popliteal pulses are 1+ on the right side, and 1+ on the left side.       Dorsalis pedis pulses are 1+ on the right side, and 1+ on the left side.       Posterior tibial pulses are 0 on the right side, and 0 on the left side.  Pulmonary/Chest: Effort normal. No respiratory distress. She has no wheezes.  Diffuse mild interstitial sounds, but no obvious rales or rhonchi  Abdominal: Soft. Bowel sounds are normal. She exhibits no distension. There is no tenderness.  Scaphoid  Musculoskeletal: Normal range of motion. She exhibits no edema.  Nursing note and vitals reviewed.  General appearance: alert, cooperative, appears stated age, no distress. She is very thin and frail, but looks vibrant and well-groomed. HEENT: [place]/AT, EOMI, MMM, anicteric sclera Neck: no adenopathy, soft bilateral carotid bruit, and no JVD Lungs: clear to auscultation bilaterally, normal percussion bilaterally and non-labored Heart: RRR with normal S1 and S2. Soft 1/6 SEM at RUSB with faint HSM at apex. No other M/R/G. Abdomen: soft, non-tender; bowel sounds normal; no masses,  no organomegaly; no HJR Extremities: extremities normal, atraumatic, no cyanosis, or edema  Pulses: 2+ and symmetric;  Skin: mobility and turgor normal, no evidence of bleeding or bruising and no lesions noted - usual post hospital bruising Neurologic: Mental status: Alert & oriented x 3, thought content appropriate; non-focal exam.  Pleasant mood & affect.   Adult ECG Report Not checked   Other studies Reviewed: Additional studies/ records that were reviewed today include:     Recent Labs: From November-December.  Normal LFTs.  BUN/Creatinine 25/1.41.  Potassium 5.0  ASSESSMENT / PLAN: Problem List Items Addressed This Visit    AAA (abdominal aortic aneurysm) ([place]) (Chronic)    She was due to have abdominal aortic  ultrasounds done, but they had not been done when ordered by myself or Dr. Donzetta Matters --> will reorder Dopplers      Relevant Medications   aspirin 81 MG tablet   Other Relevant Orders   VAS [place] AAA DUPLEX   VAS US CAROTID   VAS [place] LOWER EXTREMITY ARTERIAL DUPLEX   Atherosclerotic PVD with intermittent claudication - near occlusive SFA disease with moderate iliac disease - Primary (Chronic)  Her last evaluation was in 2014-2016 timeframe.  She decided not to have her Dopplers done last year or the ones ordered earlier this year by Dr. Donzetta Matters. Since she is now complaining of bilateral arm and leg pain, proceed with her peripheral vascular Dopplers including carotid and lower extremity arterial Dopplers as well as abdominal aortic Dopplers.  Depending on what we find, will defer these results back to Dr. Donzetta Matters to discuss any potential options for treatment besides what she is already on. Am not 100% convinced that her symptoms are claudication however.  We will stop statin to ensure that there is no side effect from statin involved.  Continue on aspirin at 81 mg daily.  I do not think she tolerated Pletal in the past.  Potential treatment options would include calcium channel blocker such as amlodipine or felodipine.      Relevant Medications   aspirin 81 MG tablet   Other Relevant Orders   VAS [place] AAA DUPLEX   VAS US CAROTID   VAS [place] LOWER EXTREMITY ARTERIAL DUPLEX   Bilateral carotid bruits    No mild-moderate carotid stenoses.  We may as we will go ahead and complete her Doppler studies and obtain carotid Dopplers as well.  Would hope to get a good view of her upper extremity Dopplers since she is having arm pain.  I suspect the left arm pain is not claudication type pain, but more likely to be myalgia from statin.      Essential hypertension (Chronic)    She has been pretty well controlled on low-dose hydralazine 50 mg twice daily.  She does have enough blood pressure room to potentially  consider adding a calcium channel blocker for additional blood pressure control and potential claudication symptom management for vasodilation.      Relevant Medications   aspirin 81 MG tablet   Hyperlipidemia with target low density lipoprotein (LDL) cholesterol less than 70 mg/dL (Chronic)    Previously her labs have looked well controlled on low-dose simvastatin.  Intolerant of atorvastatin for myalgias.  I am concerned now that this could be the etiology for her symptoms and would like to stop her statin permanently and then reassess in a month or so to see how she is feeling.  I will have her start co-Q10 supplement.      Relevant Medications   aspirin 81 MG tablet      Current medicines are reviewed at length with the patient today. (+/- concerns) n/a The following changes have been made: n/a  Patient Instructions  SCHEDULE [address] [place] has requested that you have a carotid duplex. This test is an ultrasound of the carotid arteries in your neck. It looks at blood flow through these arteries that supply the brain with blood. Allow one hour for this exam. There are no restrictions or special instructions.  AND Your physician has requested that you have an abdominal aorta duplex. During this test, an ultrasound is used to evaluate the aorta. Allow 30 minutes for this exam. Do not eat after midnight the day before and avoid carbonated beverages AND  Your physician has requested that you have a lower extremity arterial duplex. This test is an ultrasound of the arteries in the legs. It looks at arterial blood flow in the legs. Allow one hour for Lower Arterial scans. There are no restrictions or special instructions   MEDICATION INSTRUCTION  ----STOP SIMVASTATIN  - START CoQ10 100  One tablet for one week  at breakfast, then added another tablet to meal each week for week until you are taking one tablet 3 times a day.  MAY TAKE  GENERIC TYLENOL FOR  PAIN  AND USE TYLENOL -PM FOR SLEEP , PAIN   Your physician wants you to follow-up in Feb 2019 with Dr Ellyn Hack.You will receive a reminder letter in the mail two months in advance. If you don't receive a letter, please call our office to schedule the follow-up appointment.    Studies Ordered:   No orders of the defined types were placed in this encounter.     Glenetta Hew, M.D., M.S. Interventional Cardiologist   Pager # [phone] Phone # [phone] [address]. [place], [place] [address]

## 2017-07-18 ENCOUNTER — Encounter: Payer: Self-pay | Admitting: Cardiology

## 2017-07-18 DIAGNOSIS — R0989 Other specified symptoms and signs involving the circulatory and respiratory systems: Secondary | ICD-10-CM | POA: Insufficient documentation

## 2017-07-18 NOTE — Assessment & Plan Note (Signed)
She has been pretty well controlled on low-dose hydralazine 50 mg twice daily.  She does have enough blood pressure room to potentially consider adding a calcium channel blocker for additional blood pressure control and potential claudication symptom management for vasodilation.

## 2017-07-18 NOTE — Assessment & Plan Note (Addendum)
Her last evaluation was in 2014-2016 timeframe.  She decided not to have her Dopplers done last year or the ones ordered earlier this year by Dr. Donzetta Matters. Since she is now complaining of bilateral arm and leg pain, proceed with her peripheral vascular Dopplers including carotid and lower extremity arterial Dopplers as well as abdominal aortic Dopplers.  Depending on what we find, will defer these results back to Dr. Donzetta Matters to discuss any potential options for treatment besides what she is already on. Am not 100% convinced that her symptoms are claudication however.  We will stop statin to ensure that there is no side effect from statin involved.  Continue on aspirin at 81 mg daily.  I do not think she tolerated Pletal in the past.  Potential treatment options would include calcium channel blocker such as amlodipine or felodipine.

## 2017-07-18 NOTE — Assessment & Plan Note (Signed)
No mild-moderate carotid stenoses.  We may as we will go ahead and complete her Doppler studies and obtain carotid Dopplers as well.  Would hope to get a good view of her upper extremity Dopplers since she is having arm pain.  I suspect the left arm pain is not claudication type pain, but more likely to be myalgia from statin.

## 2017-07-18 NOTE — Assessment & Plan Note (Signed)
She was due to have abdominal aortic ultrasounds done, but they had not been done when ordered by myself or Dr. Donzetta Matters --> will reorder Dopplers

## 2017-07-18 NOTE — Assessment & Plan Note (Addendum)
Previously her labs have looked well controlled on low-dose simvastatin.  Intolerant of atorvastatin for myalgias.  I am concerned now that this could be the etiology for her symptoms and would like to stop her statin permanently and then reassess in a month or so to see how she is feeling.  I will have her start co-Q10 supplement.

## 2017-07-28 ENCOUNTER — Ambulatory Visit (HOSPITAL_COMMUNITY): Payer: Medicare Other

## 2017-08-09 ENCOUNTER — Encounter (HOSPITAL_COMMUNITY): Payer: Medicare Other

## 2017-08-09 ENCOUNTER — Other Ambulatory Visit (HOSPITAL_COMMUNITY): Payer: Medicare Other

## 2017-08-17 ENCOUNTER — Encounter (HOSPITAL_COMMUNITY): Payer: Self-pay | Admitting: *Deleted

## 2017-08-17 ENCOUNTER — Inpatient Hospital Stay (HOSPITAL_COMMUNITY)
Admission: EM | Admit: 2017-08-17 | Discharge: 2017-08-21 | DRG: 481 | Disposition: A | Discharge status: Skilled Nursing Facility | Payer: Medicare Other | Attending: Internal Medicine | Admitting: Internal Medicine

## 2017-08-17 ENCOUNTER — Other Ambulatory Visit: Payer: Self-pay

## 2017-08-17 ENCOUNTER — Emergency Department (HOSPITAL_COMMUNITY): Payer: Medicare Other

## 2017-08-17 DIAGNOSIS — I081 Rheumatic disorders of both mitral and tricuspid valves: Secondary | ICD-10-CM | POA: Diagnosis present

## 2017-08-17 DIAGNOSIS — Z419 Encounter for procedure for purposes other than remedying health state, unspecified: Secondary | ICD-10-CM

## 2017-08-17 DIAGNOSIS — K219 Gastro-esophageal reflux disease without esophagitis: Secondary | ICD-10-CM | POA: Diagnosis present

## 2017-08-17 DIAGNOSIS — S72002A Fracture of unspecified part of neck of left femur, initial encounter for closed fracture: Secondary | ICD-10-CM | POA: Diagnosis not present

## 2017-08-17 DIAGNOSIS — S72142A Displaced intertrochanteric fracture of left femur, initial encounter for closed fracture: Secondary | ICD-10-CM | POA: Diagnosis present

## 2017-08-17 DIAGNOSIS — M25552 Pain in left hip: Secondary | ICD-10-CM | POA: Diagnosis present

## 2017-08-17 DIAGNOSIS — I129 Hypertensive chronic kidney disease with stage 1 through stage 4 chronic kidney disease, or unspecified chronic kidney disease: Secondary | ICD-10-CM | POA: Diagnosis present

## 2017-08-17 DIAGNOSIS — N183 Chronic kidney disease, stage 3 unspecified: Secondary | ICD-10-CM | POA: Diagnosis present

## 2017-08-17 DIAGNOSIS — Z7982 Long term (current) use of aspirin: Secondary | ICD-10-CM

## 2017-08-17 DIAGNOSIS — S72002D Fracture of unspecified part of neck of left femur, subsequent encounter for closed fracture with routine healing: Secondary | ICD-10-CM | POA: Diagnosis not present

## 2017-08-17 DIAGNOSIS — D72829 Elevated white blood cell count, unspecified: Secondary | ICD-10-CM | POA: Diagnosis not present

## 2017-08-17 DIAGNOSIS — Z01811 Encounter for preprocedural respiratory examination: Secondary | ICD-10-CM

## 2017-08-17 DIAGNOSIS — I10 Essential (primary) hypertension: Secondary | ICD-10-CM | POA: Diagnosis present

## 2017-08-17 DIAGNOSIS — Z681 Body mass index (BMI) 19 or less, adult: Secondary | ICD-10-CM

## 2017-08-17 DIAGNOSIS — R339 Retention of urine, unspecified: Secondary | ICD-10-CM | POA: Diagnosis not present

## 2017-08-17 DIAGNOSIS — N179 Acute kidney failure, unspecified: Secondary | ICD-10-CM | POA: Diagnosis not present

## 2017-08-17 DIAGNOSIS — W06XXXA Fall from bed, initial encounter: Secondary | ICD-10-CM | POA: Diagnosis present

## 2017-08-17 DIAGNOSIS — R0602 Shortness of breath: Secondary | ICD-10-CM

## 2017-08-17 DIAGNOSIS — D62 Acute posthemorrhagic anemia: Secondary | ICD-10-CM | POA: Diagnosis not present

## 2017-08-17 DIAGNOSIS — Z8249 Family history of ischemic heart disease and other diseases of the circulatory system: Secondary | ICD-10-CM | POA: Diagnosis not present

## 2017-08-17 DIAGNOSIS — D631 Anemia in chronic kidney disease: Secondary | ICD-10-CM | POA: Diagnosis present

## 2017-08-17 DIAGNOSIS — Z9181 History of falling: Secondary | ICD-10-CM

## 2017-08-17 DIAGNOSIS — K5641 Fecal impaction: Secondary | ICD-10-CM | POA: Diagnosis present

## 2017-08-17 DIAGNOSIS — E785 Hyperlipidemia, unspecified: Secondary | ICD-10-CM | POA: Diagnosis present

## 2017-08-17 DIAGNOSIS — Z888 Allergy status to other drugs, medicaments and biological substances status: Secondary | ICD-10-CM

## 2017-08-17 DIAGNOSIS — I701 Atherosclerosis of renal artery: Secondary | ICD-10-CM | POA: Diagnosis present

## 2017-08-17 DIAGNOSIS — I714 Abdominal aortic aneurysm, without rupture: Secondary | ICD-10-CM | POA: Diagnosis present

## 2017-08-17 DIAGNOSIS — E871 Hypo-osmolality and hyponatremia: Secondary | ICD-10-CM | POA: Diagnosis present

## 2017-08-17 DIAGNOSIS — Z79899 Other long term (current) drug therapy: Secondary | ICD-10-CM

## 2017-08-17 DIAGNOSIS — E861 Hypovolemia: Secondary | ICD-10-CM | POA: Diagnosis not present

## 2017-08-17 DIAGNOSIS — S72009A Fracture of unspecified part of neck of unspecified femur, initial encounter for closed fracture: Secondary | ICD-10-CM

## 2017-08-17 DIAGNOSIS — R636 Underweight: Secondary | ICD-10-CM | POA: Diagnosis present

## 2017-08-17 DIAGNOSIS — Z87891 Personal history of nicotine dependence: Secondary | ICD-10-CM

## 2017-08-17 DIAGNOSIS — I739 Peripheral vascular disease, unspecified: Secondary | ICD-10-CM | POA: Diagnosis present

## 2017-08-17 DIAGNOSIS — H9190 Unspecified hearing loss, unspecified ear: Secondary | ICD-10-CM | POA: Diagnosis present

## 2017-08-17 DIAGNOSIS — W19XXXA Unspecified fall, initial encounter: Secondary | ICD-10-CM

## 2017-08-17 LAB — BASIC METABOLIC PANEL
ANION GAP: 10 (ref 5–15)
BUN: 35 mg/dL — AB (ref 6–20)
CALCIUM: 9.5 mg/dL (ref 8.9–10.3)
CO2: 28 mmol/L (ref 22–32)
Chloride: 94 mmol/L — ABNORMAL LOW (ref 101–111)
Creatinine, Ser: 1.17 mg/dL — ABNORMAL HIGH (ref 0.44–1.00)
GFR calc Af Amer: 46 mL/min — ABNORMAL LOW (ref >60)
GFR, EST NON AFRICAN AMERICAN: 40 mL/min — AB (ref >60)
GLUCOSE: 124 mg/dL — AB (ref 65–99)
Potassium: 4 mmol/L (ref 3.5–5.1)
Sodium: 132 mmol/L — ABNORMAL LOW (ref 135–145)

## 2017-08-17 LAB — CBC WITH DIFFERENTIAL/PLATELET
BASOS ABS: 0 10*3/uL (ref 0.0–0.1)
BASOS PCT: 0 %
EOS PCT: 0 %
Eosinophils Absolute: 0 10*3/uL (ref 0.0–0.7)
HEMATOCRIT: 29.6 % — AB (ref 36.0–46.0)
Hemoglobin: 9.9 g/dL — ABNORMAL LOW (ref 12.0–15.0)
Lymphocytes Relative: 5 %
Lymphs Abs: 0.7 10*3/uL (ref 0.7–4.0)
MCH: 30.6 pg (ref 26.0–34.0)
MCHC: 33.4 g/dL (ref 30.0–36.0)
MCV: 91.4 fL (ref 78.0–100.0)
MONO ABS: 0.4 10*3/uL (ref 0.1–1.0)
MONOS PCT: 3 %
NEUTROS ABS: 13 10*3/uL — AB (ref 1.7–7.7)
Neutrophils Relative %: 92 %
PLATELETS: 444 10*3/uL — AB (ref 150–400)
RBC: 3.24 MIL/uL — ABNORMAL LOW (ref 3.87–5.11)
RDW: 13.4 % (ref 11.5–15.5)
WBC: 14 10*3/uL — ABNORMAL HIGH (ref 4.0–10.5)

## 2017-08-17 LAB — ABO/RH: ABO/RH(D): A POS

## 2017-08-17 LAB — PROTIME-INR
INR: 0.92
Prothrombin Time: 12.3 seconds (ref 11.4–15.2)

## 2017-08-17 LAB — TYPE AND SCREEN
ABO/RH(D): A POS
Antibody Screen: NEGATIVE

## 2017-08-17 MED ORDER — SODIUM CHLORIDE 0.9 % IV SOLN
INTRAVENOUS | Status: DC
  Administered 2017-08-17: 20:00:00 via INTRAVENOUS

## 2017-08-17 MED ORDER — ENSURE ENLIVE PO LIQD
237.0000 mL | Freq: Two times a day (BID) | ORAL | Status: DC
  Administered 2017-08-19 – 2017-08-21 (×6): 237 mL via ORAL

## 2017-08-17 MED ORDER — HYDRALAZINE HCL 20 MG/ML IJ SOLN
10.0000 mg | Freq: Four times a day (QID) | INTRAMUSCULAR | Status: DC | PRN
Start: 2017-08-17 — End: 2017-08-21

## 2017-08-17 MED ORDER — HYDRALAZINE HCL 50 MG PO TABS
50.0000 mg | ORAL_TABLET | Freq: Two times a day (BID) | ORAL | Status: DC
  Administered 2017-08-17 – 2017-08-21 (×5): 50 mg via ORAL
  Filled 2017-08-17 (×6): qty 1

## 2017-08-17 MED ORDER — ALBUTEROL SULFATE (2.5 MG/3ML) 0.083% IN NEBU: 2.5000 mg | INHALATION_SOLUTION | RESPIRATORY_TRACT | Status: DC | PRN

## 2017-08-17 MED ORDER — ONDANSETRON HCL 4 MG PO TABS: 4.0000 mg | ORAL_TABLET | Freq: Four times a day (QID) | ORAL | Status: DC | PRN

## 2017-08-17 MED ORDER — SENNA 8.6 MG PO TABS
2.0000 | ORAL_TABLET | Freq: Two times a day (BID) | ORAL | Status: DC
  Administered 2017-08-17 – 2017-08-19 (×3): 17.2 mg via ORAL
  Filled 2017-08-17 (×3): qty 2

## 2017-08-17 MED ORDER — ACETAMINOPHEN 650 MG RE SUPP: 650.0000 mg | Freq: Four times a day (QID) | RECTAL | Status: DC | PRN

## 2017-08-17 MED ORDER — SODIUM CHLORIDE 0.9 % IV SOLN: 250.0000 mL | INTRAVENOUS | Status: DC | PRN

## 2017-08-17 MED ORDER — COQ10 100 MG PO CAPS: 100.0000 mg | ORAL_CAPSULE | Freq: Three times a day (TID) | ORAL | Status: DC

## 2017-08-17 MED ORDER — TRAZODONE HCL 50 MG PO TABS
50.0000 mg | ORAL_TABLET | Freq: Every evening | ORAL | Status: DC | PRN
  Administered 2017-08-18 – 2017-08-20 (×3): 50 mg via ORAL
  Filled 2017-08-17 (×3): qty 1

## 2017-08-17 MED ORDER — ACETAMINOPHEN 325 MG PO TABS: 650.0000 mg | ORAL_TABLET | Freq: Four times a day (QID) | ORAL | Status: DC | PRN

## 2017-08-17 MED ORDER — POLYETHYLENE GLYCOL 3350 17 G PO PACK: 17.0000 g | PACK | Freq: Every day | ORAL | Status: DC | PRN

## 2017-08-17 MED ORDER — RISAQUAD PO CAPS
1.0000 | ORAL_CAPSULE | Freq: Every day | ORAL | Status: DC
  Administered 2017-08-17 – 2017-08-20 (×4): 1 via ORAL
  Filled 2017-08-17 (×4): qty 1

## 2017-08-17 MED ORDER — SODIUM CHLORIDE 0.9% FLUSH: 3.0000 mL | INTRAVENOUS | Status: DC | PRN

## 2017-08-17 MED ORDER — HEPARIN SODIUM (PORCINE) 5000 UNIT/ML IJ SOLN: 5000.0000 [IU] | Freq: Three times a day (TID) | INTRAMUSCULAR | Status: DC

## 2017-08-17 MED ORDER — OXYCODONE HCL 5 MG PO TABS
5.0000 mg | ORAL_TABLET | ORAL | Status: DC | PRN
  Administered 2017-08-17 – 2017-08-21 (×6): 5 mg via ORAL
  Filled 2017-08-17 (×6): qty 1

## 2017-08-17 MED ORDER — ONDANSETRON HCL 4 MG/2ML IJ SOLN: 4.0000 mg | Freq: Four times a day (QID) | INTRAMUSCULAR | Status: DC | PRN

## 2017-08-17 MED ORDER — SODIUM CHLORIDE 0.9% FLUSH
3.0000 mL | Freq: Two times a day (BID) | INTRAVENOUS | Status: DC
  Administered 2017-08-17 – 2017-08-21 (×7): 3 mL via INTRAVENOUS

## 2017-08-17 MED ORDER — ASPIRIN EC 81 MG PO TBEC
81.0000 mg | DELAYED_RELEASE_TABLET | Freq: Every day | ORAL | Status: DC
  Filled 2017-08-17: qty 1

## 2017-08-17 MED ORDER — FENTANYL CITRATE (PF) 100 MCG/2ML IJ SOLN
50.0000 ug | Freq: Once | INTRAMUSCULAR | Status: AC
  Administered 2017-08-17: 50 ug via INTRAVENOUS
  Filled 2017-08-17: qty 2

## 2017-08-17 MED ORDER — BISACODYL 10 MG RE SUPP
10.0000 mg | Freq: Once | RECTAL | Status: AC
  Administered 2017-08-17: 10 mg via RECTAL
  Filled 2017-08-17: qty 1

## 2017-08-17 MED ORDER — FLEET ENEMA 7-19 GM/118ML RE ENEM
1.0000 | ENEMA | Freq: Once | RECTAL | Status: AC
  Administered 2017-08-17: 1 via RECTAL
  Filled 2017-08-17: qty 1

## 2017-08-17 MED ORDER — MORPHINE SULFATE (PF) 2 MG/ML IV SOLN
2.0000 mg | INTRAVENOUS | Status: DC | PRN
  Administered 2017-08-17 – 2017-08-18 (×3): 2 mg via INTRAVENOUS
  Filled 2017-08-17 (×3): qty 1

## 2017-08-17 MED ORDER — POLYETHYLENE GLYCOL 3350 17 G PO PACK
17.0000 g | PACK | Freq: Two times a day (BID) | ORAL | Status: DC
  Administered 2017-08-17 – 2017-08-21 (×5): 17 g via ORAL
  Filled 2017-08-17 (×5): qty 1

## 2017-08-17 NOTE — Progress Notes (Signed)
PHARMACIST - PHYSICIAN ORDER COMMUNICATION  CONCERNING: P&T Medication Policy on Herbal Medications  DESCRIPTION:  This patient's order for:  CoQ-10  has been noted.  This product(s) is classified as an "herbal" or natural product. Due to a lack of definitive safety studies or FDA approval, nonstandard manufacturing practices, plus the potential risk of unknown drug-drug interactions while on inpatient medications, the Pharmacy and Therapeutics Committee does not permit the use of "herbal" or natural products of this type within [facility].   ACTION TAKEN: The pharmacy department is unable to verify this order at this time and the order has been discontinued. Please reevaluate patient's clinical condition at discharge and address if the herbal or natural product(s) should be resumed at that time.  Royetta Asal, PharmD, BCPS Pager [phone] 08/17/2017 6:59 PM

## 2017-08-17 NOTE — ED Triage Notes (Signed)
Per EMS, pt from home rolled off her bed last night. Pt's children called 911 when they found her this morning. Pt's left leg was externally rotated upon arrival. Pt given 220mcg fentanyl en route and had pelvic binder placed.   Pt denies loss of consciousness, did not hit head. Pt only complains of pain to left hip.  BP 122/84 RR 18 HR 78

## 2017-08-17 NOTE — Progress Notes (Signed)
Pt gave permission for daughter in law to be present in room and assist in answering questions on nursing admission hx. Lucius Conn BSN, RN-BC Admissions RN 08/17/2017 4:44 PM

## 2017-08-17 NOTE — H&P (Signed)
Patient Demographics:    Kristina [name], is a 84 y.o. female  MRN: [id number]   DOB - 1925-09-27  Admit Date - 08/17/2017  Outpatient Primary MD for the patient is [name], MD   Assessment & Plan:    Principal Problem:   Lt Hip fracture [facility]) Active Problems:   Essential hypertension   Chronic kidney disease (CKD), stage III (moderate) ([place])   Hyponatremia   Fecal impaction in rectum ([place])    1)Lt Hip Fx-status post mechanical fall, orthopedic consult appreciated, plan is for  ORIF by Dr [name] on 08/18/17 (maybe at [place]) as needed opiates for pain relief,. Post op  Patient will need PT eval and social worker consult  2)Constipation/Fecal Impaction-the patient will require manual disimpaction after hip surgery, difficult to do a rectal exam at this time due to severe pain in the setting of acute left hip fracture, may use Dulcolax suppository, Fleet enema if able, otherwise manual disimpaction postop  3)Social/Ethics-discussed with patient's daughter at bedside and patient herself, patient is a full code  4)H/o  chronic anemia-noted on admission, etiology unclear, please note the patient does have CKD stage III at baseline which may be contributing to her chronic anemia, follow H&H anticipated drop in H&H with hydration and then postop period.  Transfuse packed cells as clinically indicated  5)H/o HTN-   may use IV Hydralazine 10 mg  Every 4 hours Prn for systolic blood pressure over 160 mmhg   With History of - Reviewed by me  Past Medical History:  Diagnosis Date  . AAA (abdominal aortic aneurysm) (HCC)    Mild - measuring 2.8x2.8cm  . Acute hyponatremia 11/2016  . 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 HOH (hard of hearing)   . Hyperlipidemia    Well-controlled  . Hypertension   . PAD (peripheral artery disease) (HCC)    LEA Dopplers: Right external iliac 40%, right mid SFA 70-99% with occlusion in Hunter's canal. Angiography. LEFT common iliac 60%, left SFA 70-9%, popliteal 50%; RIGHT posterior tibial occluded;; carotid Dopplers in 2009: Less than 50% stenosis.  . Renal artery stenosis [facility])       Past Surgical History:  Procedure Laterality Date  . LOWER EXTREMITY ARTERIAL DOPPLER  05/30/2011   Right EIA->50% daimeter reduction, Rt SFA 70-99% diameter reduction, Rt SFA-occlusive disease at Hunter's canal, Rt PTA-appeared occluded, Lft CIA-moderate amount of calcific plaque w/ >60% diameter reduction, Lft prox SFA 70-99% diameter reduction, Lft POP-large amount of irregular mixed suggesting <50% diameter reduction  . NM MYOVIEW LTD  06/25/2008   No scintigraphic evidence of inducible myocardial ischemia  . PERIPHERAL VASCULAR ANGIOGRAM  07/13/2009   No intervention - "plan on doing staged intervention in near future"  . TRANSTHORACIC ECHOCARDIOGRAM  11/14/2007   EF >55%, mild mitral and tricuspid regurg.  Chief Complaint  Patient presents with  . Fall  . Hip Pain      HPI:    Kristina [name]  is a 84 y.o. female with past medical history relevant for hypertension, chronic anemia and CKD stage III who presents to the ED with complaints of left hip fall after falling out of bed overnight.  Denies head injury or loss of consciousness, denies chest pains, palpitations ,dizziness or pleuritic symptoms  No fevers, no chills, no vomiting, no diarrhea  Additional history obtained from patient daughter at bedside  In the ED patient rated the pain a 10/10 with minimal movement, she did receive IV opiates for pain relief  Imaging studies confirms left hip fracture and  fecal impaction  ED physician discussed this case with on-call orthopedic  surgeon Dr [name] who advised admission to the hospital possibly [facility] for surgical correction of left hip fracture    Review of systems:    In addition to the HPI above,   A full 12 point Review of 10 Systems was done, except as stated above, all other Review of 10 Systems were negative.    Social History:  Reviewed by me    Social History   Tobacco Use  . Smoking status: Former Smoker    Types: Cigarettes    Last attempt to quit: 07/11/2001    Years since quitting: 16.1  . Smokeless tobacco: Never Used  Substance Use Topics  . Alcohol use: Yes    Alcohol/week: 0.6 oz    Types: 1 Glasses of wine per week       Family History :  Reviewed by me    Family History  Problem Relation Age of Onset  . Heart attack Brother   . Cancer Brother        Bone cancer    Home Medications:   Prior to Admission medications   Medication Sig Start Date End Date Taking? Authorizing Provider  aspirin 81 MG tablet Take 81 mg by mouth daily.   Yes [provider]  Coenzyme Q10 (COQ10) 100 MG CAPS Take 100 mg by mouth 3 (three) times daily with meals. 07/17/17  Yes Leonie Man, MD  Docusate Sodium (COLACE PO) Take 1 tablet by mouth daily.   Yes [provider]  hydrALAZINE (APRESOLINE) 50 MG tablet Take 50 mg by mouth 2 (two) times daily.   Yes [provider]  polyethylene glycol (MIRALAX / GLYCOLAX) packet Take 17 g by mouth daily.   Yes [provider]  Probiotic Product (ALIGN) 4 MG CAPS Take 4 mg by mouth at bedtime.    Yes [provider]     Allergies:     Allergies  Allergen Reactions  . Lipitor [Atorvastatin] Other (See Comments)    Reaction:  Unknown   . Pletal [Cilostazol] Other (See Comments)    Reaction:  Unknown   . Simvastatin     MYALGIAS     Physical Exam:   Vitals  Blood pressure (!) 182/77, pulse 87, temperature 97.7 F (36.5 C), temperature source Oral, resp. rate 20, SpO2 98 %.  Physical  Examination: General appearance - alert, well appearing, and in no distress  Mental status - alert, oriented to person, place, and time Eyes - sclera anicteric Neck - supple, no JVD elevation , Chest - clear  to auscultation bilaterally, symmetrical air movement,  Heart - S1 and S2 normal,  Abdomen - soft, nontender, nondistended, no masses or organomegaly Neurological - screening  mental status exam normal, neck supple without rigidity, cranial nerves II through XII intact, DTR's normal and symmetric Extremities - no pedal edema noted, intact peripheral pulses, point tenderness on palpation of the left hip area, left lower extremity is rotated externally and shortened Skin - warm, dry Psych-affect is appropriate   Data Review:    CBC Recent Labs  Lab 08/17/17 1319  WBC 14.0*  HGB 9.9*  HCT 29.6*  PLT 444*  MCV 91.4  MCH 30.6  MCHC 33.4  RDW 13.4  LYMPHSABS 0.7  MONOABS 0.4  EOSABS 0.0  BASOSABS 0.0   ------------------------------------------------------------------------------------------------------------------  Chemistries  Recent Labs  Lab 08/17/17 1319  NA 132*  K 4.0  CL 94*  CO2 28  GLUCOSE 124*  BUN 35*  CREATININE 1.17*  CALCIUM 9.5   ------------------------------------------------------------------------------------------------------------------ CrCl cannot be calculated (Unknown ideal weight.). ------------------------------------------------------------------------------------------------------------------ No results for input(s): TSH, T4TOTAL, T3FREE, THYROIDAB in the last 72 hours.  Invalid input(s): FREET3   Coagulation profile Recent Labs  Lab 08/17/17 1319  INR 0.92   ------------------------------------------------------------------------------------------------------------------- No results for input(s): DDIMER in the last 72  hours. -----------------------------------------------------------------------------------------------------------------  Cardiac Enzymes No results for input(s): CKMB, TROPONINI, MYOGLOBIN in the last 168 hours.  Invalid input(s): CK ------------------------------------------------------------------------------------------------------------------ No results found for: BNP   ---------------------------------------------------------------------------------------------------------------  Urinalysis    Component Value Date/Time   COLORURINE STRAW (A) 12/09/2016 1846   APPEARANCEUR CLEAR 12/09/2016 1846   LABSPEC 1.011 12/09/2016 1846   PHURINE 5.0 12/09/2016 1846   GLUCOSEU NEGATIVE 12/09/2016 1846   HGBUR NEGATIVE 12/09/2016 1846   BILIRUBINUR NEGATIVE 12/09/2016 1846   KETONESUR NEGATIVE 12/09/2016 1846   PROTEINUR NEGATIVE 12/09/2016 1846   NITRITE NEGATIVE 12/09/2016 1846   LEUKOCYTESUR SMALL (A) 12/09/2016 1846    ----------------------------------------------------------------------------------------------------------------   Imaging Results:    Dg Chest 1 View  Result Date: 08/17/2017 CLINICAL DATA:  Patient rolled out of bed last night and has obvious deformity of the left leg. The patient denies loss of consciousness. No chest complaints. History of hypertension EXAM: CHEST 1 VIEW COMPARISON:  Chest x-ray of Dec 09, 2016 FINDINGS: The lungs are adequately inflated. There is no focal infiltrate. There is no pleural effusion. The heart and pulmonary vascularity are normal. The mediastinum is normal in width. There calcification in the wall of the aortic arch. The bony thorax exhibits no acute abnormality. IMPRESSION: There is no active cardiopulmonary disease. Thoracic aortic atherosclerosis. Electronically Signed   By: David  Martinique M.D.   On: 08/17/2017 13:39   Ct Hip Left Wo Contrast  Result Date: 08/17/2017 CLINICAL DATA:  The patient suffered a left hip fracture last  night which. Initial EXAM: CT OF THE LEFT HIP WITHOUT CONTRAST TECHNIQUE: Multidetector CT imaging of the left hip was performed according to the standard protocol. Multiplanar CT image reconstructions were also generated. COMPARISON:  Plain films left hip earlier today. FINDINGS: Bones/Joint/Cartilage As seen on the comparison plain films, the patient has an intertrochanteric fracture of the left hip. The greater trochanter is comminuted. The femoral neck is angulated 90 degrees relative to the hip joint. Bones appear osteopenic. No other fracture is identified. Ligaments Suboptimally assessed by CT. Muscles and Tendons Intact. There is some edema and hematoma about the patient's fracture. Soft tissues Soft tissue contusion over the left hip is noted. Large stool ball is present in the rectum. Extensive sigmoid diverticulosis is present. Extensive atherosclerotic vascular disease is noted. IMPRESSION: Acute left intertrochanteric fracture as described above. Large stool ball in the rectum compatible with  fecal impaction. Diverticulosis without diverticulitis. Atherosclerosis. Electronically Signed   By: Inge Rise M.D.   On: 08/17/2017 15:29   Dg Hip Unilat With Pelvis 2-3 Views Left  Result Date: 08/17/2017 CLINICAL DATA:  Patient rolled off a bed last night. Obvious abnormal rotation of the left leg. EXAM: DG HIP (WITH OR WITHOUT PELVIS) 2-3V LEFT COMPARISON:  Coronal and sagittal CT reconstructed images from a scan of November 25, 2016 FINDINGS: Patient has sustained an acute angulated fracture involving the base of the neck and the intertrochanteric region. The left femoral head remains appropriately positioned within the acetabulum. The left hemipelvis is normal. The sub trochanteric region of the left femur is normal. IMPRESSION: Acute angulated fracture involving the base of the neck and the intertrochanteric region of the left hip. Electronically Signed   By: David  Martinique M.D.   On: 08/17/2017 13:38     Radiological Exams on Admission: Dg Chest 1 View  Result Date: 08/17/2017 CLINICAL DATA:  Patient rolled out of bed last night and has obvious deformity of the left leg. The patient denies loss of consciousness. No chest complaints. History of hypertension EXAM: CHEST 1 VIEW COMPARISON:  Chest x-ray of Dec 09, 2016 FINDINGS: The lungs are adequately inflated. There is no focal infiltrate. There is no pleural effusion. The heart and pulmonary vascularity are normal. The mediastinum is normal in width. There calcification in the wall of the aortic arch. The bony thorax exhibits no acute abnormality. IMPRESSION: There is no active cardiopulmonary disease. Thoracic aortic atherosclerosis. Electronically Signed   By: David  Martinique M.D.   On: 08/17/2017 13:39   Ct Hip Left Wo Contrast  Result Date: 08/17/2017 CLINICAL DATA:  The patient suffered a left hip fracture last night which. Initial EXAM: CT OF THE LEFT HIP WITHOUT CONTRAST TECHNIQUE: Multidetector CT imaging of the left hip was performed according to the standard protocol. Multiplanar CT image reconstructions were also generated. COMPARISON:  Plain films left hip earlier today. FINDINGS: Bones/Joint/Cartilage As seen on the comparison plain films, the patient has an intertrochanteric fracture of the left hip. The greater trochanter is comminuted. The femoral neck is angulated 90 degrees relative to the hip joint. Bones appear osteopenic. No other fracture is identified. Ligaments Suboptimally assessed by CT. Muscles and Tendons Intact. There is some edema and hematoma about the patient's fracture. Soft tissues Soft tissue contusion over the left hip is noted. Large stool ball is present in the rectum. Extensive sigmoid diverticulosis is present. Extensive atherosclerotic vascular disease is noted. IMPRESSION: Acute left intertrochanteric fracture as described above. Large stool ball in the rectum compatible with fecal impaction. Diverticulosis  without diverticulitis. Atherosclerosis. Electronically Signed   By: Inge Rise M.D.   On: 08/17/2017 15:29   Dg Hip Unilat With Pelvis 2-3 Views Left  Result Date: 08/17/2017 CLINICAL DATA:  Patient rolled off a bed last night. Obvious abnormal rotation of the left leg. EXAM: DG HIP (WITH OR WITHOUT PELVIS) 2-3V LEFT COMPARISON:  Coronal and sagittal CT reconstructed images from a scan of November 25, 2016 FINDINGS: Patient has sustained an acute angulated fracture involving the base of the neck and the intertrochanteric region. The left femoral head remains appropriately positioned within the acetabulum. The left hemipelvis is normal. The sub trochanteric region of the left femur is normal. IMPRESSION: Acute angulated fracture involving the base of the neck and the intertrochanteric region of the left hip. Electronically Signed   By: David  Martinique M.D.   On: 08/17/2017 13:38  DVT Prophylaxis -SCD  = AM Labs Ordered, also please review Full Orders  Family Communication: Admission, patients condition and plan of care including tests being ordered have been discussed with the patient and daughter who indicate understanding and agree with the plan   Code Status - Full Code  Likely DC to  SNF Vs Home with [facility]   Condition   Stable   Roxan Hockey M.D on 08/17/2017 at 6:57 PM  Between 7am to 7pm - Pager - [phone] After 7pm go to [email or web address] - password [email or web address]  [facility] - Office  [phone]  Voice Recognition Viviann Spare dictation system was used to create this note, attempts have been made to correct errors. Please contact the author with questions and/or clarifications.

## 2017-08-17 NOTE — ED Notes (Signed)
Report called. Patient and family updated on POC. Awaiting assistance from another staff member to transport this patient upstairs R/T hip fracture.

## 2017-08-17 NOTE — ED Notes (Signed)
Patient pj pants and underwear cut off, with patient and daughter's approval. Patient was lying in clothing soiled with stool and urine. Peri care completed using log roll technique, accompanied by Ubaldo Glassing RN and Burnadette Peter NT. Purewick placed related to patient immobility.

## 2017-08-17 NOTE — ED Notes (Signed)
This nurse, with the assistance of [employer] and Sam NT, attempted manual disimpaction at the request of the hospitalist, but was unsuccessful. Patient tolerated well. Suppository placed per order and IV morphine administered. Will reassess at a later time.

## 2017-08-17 NOTE — Consult Note (Signed)
ORTHOPAEDIC CONSULTATION  REQUESTING PHYSICIAN: Roxan Hockey, MD  Chief Complaint: Left intertroch hip fracture  HPI: Kristina [name] is a 84 y.o. female who presents with left hip fracture s/p mechanical fall PTA out of bed.  The patient endorses severe pain in the left hip, that does not radiate, grinding in quality, worse with any movement, better with immobilization.  Denies LOC/fever/chills/nausea/vomiting.  Walks without assistive devices (walker, cane, wheelchair).  Does live independently but household ambulator.  Denies LOC, neck pain, abd pain.  Past Medical History:  Diagnosis Date  . AAA (abdominal aortic aneurysm) (HCC)    Mild - measuring 2.8x2.8cm  . Acute hyponatremia 11/2016  . 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 HOH (hard of hearing)   . Hyperlipidemia    Well-controlled  . Hypertension   . PAD (peripheral artery disease) (HCC)    LEA Dopplers: Right external iliac 40%, right mid SFA 70-99% with occlusion in Hunter's canal. Angiography. LEFT common iliac 60%, left SFA 70-9%, popliteal 50%; RIGHT posterior tibial occluded;; carotid Dopplers in 2009: Less than 50% stenosis.  . Renal artery stenosis [facility])    Past Surgical History:  Procedure Laterality Date  . LOWER EXTREMITY ARTERIAL DOPPLER  05/30/2011   Right EIA->50% daimeter reduction, Rt SFA 70-99% diameter reduction, Rt SFA-occlusive disease at Hunter's canal, Rt PTA-appeared occluded, Lft CIA-moderate amount of calcific plaque w/ >60% diameter reduction, Lft prox SFA 70-99% diameter reduction, Lft POP-large amount of irregular mixed suggesting <50% diameter reduction  . NM MYOVIEW LTD  06/25/2008   No scintigraphic evidence of inducible myocardial ischemia  . PERIPHERAL VASCULAR ANGIOGRAM  07/13/2009   No intervention - "plan on doing staged intervention in near future"  . TRANSTHORACIC ECHOCARDIOGRAM  11/14/2007   EF >55%, mild mitral and tricuspid regurg.   Social History   Socioeconomic History  . Marital status: Divorced    Spouse name: None  . Number of children: None  . Years of education: None  . Highest education level: None  Social Needs  . Financial resource strain: None  . Food insecurity - worry: None  . Food insecurity - inability: None  . Transportation needs - medical: None  . Transportation needs - non-medical: None  Occupational History  . None  Tobacco Use  . Smoking status: Former Smoker    Types: Cigarettes    Last attempt to quit: 07/11/2001    Years since quitting: 16.1  . Smokeless tobacco: Never Used  Substance and Sexual Activity  . Alcohol use: Yes    Alcohol/week: 0.6 oz    Types: 1 Glasses of wine per week  . Drug use: No  . Sexual activity: None  Other Topics Concern  . None  Social History Narrative   Single mother of 2, grandmother 1. Tries to exercise when she can, but is limited by claudication.   Former smoker who quit in the early 2000s.   Drinks occasional glass of wine   Family History  Problem Relation Age of Onset  . Heart attack Brother   . Cancer Brother        Bone cancer   Allergies  Allergen Reactions  . Lipitor [Atorvastatin] Other (See Comments)    Reaction:  Unknown   . Pletal [Cilostazol] Other (See Comments)    Reaction:  Unknown   . Simvastatin     MYALGIAS   Prior to Admission medications  Medication Sig Start Date End Date Taking? Authorizing Provider  aspirin 81 MG tablet Take 81 mg by mouth daily.   Yes [provider]  Coenzyme Q10 (COQ10) 100 MG CAPS Take 100 mg by mouth 3 (three) times daily with meals. 07/17/17  Yes Leonie Man, MD  Docusate Sodium (COLACE PO) Take 1 tablet by mouth daily.   Yes [provider]  hydrALAZINE (APRESOLINE) 50 MG tablet Take 50 mg by mouth 2 (two) times daily.   Yes [provider]  polyethylene glycol (MIRALAX / GLYCOLAX) packet Take 17 g by mouth  daily.   Yes [provider]  Probiotic Product (ALIGN) 4 MG CAPS Take 4 mg by mouth at bedtime.    Yes [provider]   Dg Chest 1 View  Result Date: 08/17/2017 CLINICAL DATA:  Patient rolled out of bed last night and has obvious deformity of the left leg. The patient denies loss of consciousness. No chest complaints. History of hypertension EXAM: CHEST 1 VIEW COMPARISON:  Chest x-ray of Dec 09, 2016 FINDINGS: The lungs are adequately inflated. There is no focal infiltrate. There is no pleural effusion. The heart and pulmonary vascularity are normal. The mediastinum is normal in width. There calcification in the wall of the aortic arch. The bony thorax exhibits no acute abnormality. IMPRESSION: There is no active cardiopulmonary disease. Thoracic aortic atherosclerosis. Electronically Signed   By: David  Martinique M.D.   On: 08/17/2017 13:39   Ct Hip Left Wo Contrast  Result Date: 08/17/2017 CLINICAL DATA:  The patient suffered a left hip fracture last night which. Initial EXAM: CT OF THE LEFT HIP WITHOUT CONTRAST TECHNIQUE: Multidetector CT imaging of the left hip was performed according to the standard protocol. Multiplanar CT image reconstructions were also generated. COMPARISON:  Plain films left hip earlier today. FINDINGS: Bones/Joint/Cartilage As seen on the comparison plain films, the patient has an intertrochanteric fracture of the left hip. The greater trochanter is comminuted. The femoral neck is angulated 90 degrees relative to the hip joint. Bones appear osteopenic. No other fracture is identified. Ligaments Suboptimally assessed by CT. Muscles and Tendons Intact. There is some edema and hematoma about the patient's fracture. Soft tissues Soft tissue contusion over the left hip is noted. Large stool ball is present in the rectum. Extensive sigmoid diverticulosis is present. Extensive atherosclerotic vascular disease is noted. IMPRESSION: Acute left intertrochanteric fracture as  described above. Large stool ball in the rectum compatible with fecal impaction. Diverticulosis without diverticulitis. Atherosclerosis. Electronically Signed   By: Inge Rise M.D.   On: 08/17/2017 15:29   Dg Hip Unilat With Pelvis 2-3 Views Left  Result Date: 08/17/2017 CLINICAL DATA:  Patient rolled off a bed last night. Obvious abnormal rotation of the left leg. EXAM: DG HIP (WITH OR WITHOUT PELVIS) 2-3V LEFT COMPARISON:  Coronal and sagittal CT reconstructed images from a scan of November 25, 2016 FINDINGS: Patient has sustained an acute angulated fracture involving the base of the neck and the intertrochanteric region. The left femoral head remains appropriately positioned within the acetabulum. The left hemipelvis is normal. The sub trochanteric region of the left femur is normal. IMPRESSION: Acute angulated fracture involving the base of the neck and the intertrochanteric region of the left hip. Electronically Signed   By: David  Martinique M.D.   On: 08/17/2017 13:38    All pertinent xrays, MRI, CT independently reviewed and interpreted  Positive ROS: All other systems have been reviewed and were otherwise negative with  the exception of those mentioned in the HPI and as above.  Physical Exam: General: Alert, no acute distress Cardiovascular: No pedal edema Respiratory: No cyanosis, no use of accessory musculature GI: No organomegaly, abdomen is soft and non-tender Skin: No lesions in the area of chief complaint Neurologic: Sensation intact distally Psychiatric: Patient is competent for consent with normal mood and affect Lymphatic: No axillary or cervical lymphadenopathy  MUSCULOSKELETAL:  - pain with movement of the hip and extremity - skin intact - NVI distally - compartments soft  Assessment: Left intertroch hip fracture  Plan: - surgery is recommended, patient and family are aware of r/b/a and wish to proceed - consent obtained - medical optimization per primary team -  surgery is planned for tomorrow in pm  Thank you for the consult and the opportunity to see Ms. Mase  N. Eduard Roux, MD [place] 5:22 PM

## 2017-08-17 NOTE — ED Provider Notes (Signed)
[facility] DEPT Provider Note   CSN: [id number] Arrival date & time: 08/17/17  1149     History   Chief Complaint Chief Complaint  Patient presents with  . Fall  . Hip Pain    HPI Kristina [name] is a 84 y.o. female.  HPI Patient presents with her daughter after fall that occurred overnight. Patient has pain in her left hip. Patient has a history of 2 prior falls in the last week or so, but notes that she was doing generally well until falling in the middle of the night. Patient did not want to bother other people, and wait until the morning, when she was found by a family member. Since the fall she has had pain substantially in the left hip, nonradiating, though she has been nonambulatory. No new head pain confusion, disorientation or other complaints.  Patient had 200 mcg of fentanyl in route, states that she felt better. Past Medical History:  Diagnosis Date  . AAA (abdominal aortic aneurysm) (HCC)    Mild - measuring 2.8x2.8cm  . Acute hyponatremia 11/2016  . 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 HOH (hard of hearing)   . Hyperlipidemia    Well-controlled  . Hypertension   . PAD (peripheral artery disease) (HCC)    LEA Dopplers: Right external iliac 40%, right mid SFA 70-99% with occlusion in Hunter's canal. Angiography. LEFT common iliac 60%, left SFA 70-9%, popliteal 50%; RIGHT posterior tibial occluded;; carotid Dopplers in 2009: Less than 50% stenosis.  . Renal artery stenosis [facility])     Patient Active Problem List   Diagnosis Date Noted  . Bilateral carotid bruits 07/18/2017  . Dizziness 12/09/2016  . Weakness 12/09/2016  . Malnutrition of moderate degree 12/03/2016  . Acute hyponatremia 12/01/2016  . Protein-calorie malnutrition ([place]) 12/01/2016  . Anemia 12/01/2016  . Pancreatic calcification 12/01/2016  . Abdominal pain, epigastric  12/01/2016  . Hyponatremia   . Claudication ([place]) 06/27/2016  . 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 Essential hypertension   . Hyperlipidemia with target low density lipoprotein (LDL) cholesterol less than 70 mg/dL   . Chronic kidney disease (CKD), stage III (moderate) (HCC)   . AAA (abdominal aortic aneurysm) [facility])     Past Surgical History:  Procedure Laterality Date  . LOWER EXTREMITY ARTERIAL DOPPLER  05/30/2011   Right EIA->50% daimeter reduction, Rt SFA 70-99% diameter reduction, Rt SFA-occlusive disease at Hunter's canal, Rt PTA-appeared occluded, Lft CIA-moderate amount of calcific plaque w/ >60% diameter reduction, Lft prox SFA 70-99% diameter reduction, Lft POP-large amount of irregular mixed suggesting <50% diameter reduction  . NM MYOVIEW LTD  06/25/2008   No scintigraphic evidence of inducible myocardial ischemia  . PERIPHERAL VASCULAR ANGIOGRAM  07/13/2009   No intervention - "plan on doing staged intervention in near future"  . TRANSTHORACIC ECHOCARDIOGRAM  11/14/2007   EF >55%, mild mitral and tricuspid regurg.    OB History    No data available       Home Medications    Prior to Admission medications   Medication Sig Start Date End Date Taking? Authorizing Provider  aspirin 81 MG tablet Take 81 mg by mouth daily.   Yes [provider]  Coenzyme Q10 (COQ10) 100 MG CAPS Take 100 mg by mouth 3 (three) times daily  with meals. 07/17/17  Yes Leonie Man, MD  Docusate Sodium (COLACE PO) Take 1 tablet by mouth daily.   Yes [provider]  hydrALAZINE (APRESOLINE) 50 MG tablet Take 50 mg by mouth 2 (two) times daily.   Yes [provider]  polyethylene glycol (MIRALAX / GLYCOLAX) packet Take 17 g by mouth daily.   Yes [provider]  Probiotic Product (ALIGN) 4 MG CAPS Take 4 mg by mouth at bedtime.    Yes [provider]    Family History Family History  Problem Relation Age of Onset  . Heart attack Brother   . Cancer Brother        Bone cancer    Social History Social History   Tobacco Use  . Smoking status: Former Smoker    Types: Cigarettes    Last attempt to quit: 07/11/2001    Years since quitting: 16.1  . Smokeless tobacco: Never Used  Substance Use Topics  . Alcohol use: Yes    Alcohol/week: 0.6 oz    Types: 1 Glasses of wine per week  . Drug use: No     Allergies   Lipitor [atorvastatin]; Pletal [cilostazol]; and Simvastatin   Review of Systems Review of Systems  Constitutional:       Per HPI, otherwise negative  HENT:       Per HPI, otherwise negative  Respiratory:       Per HPI, otherwise negative  Cardiovascular:       Per HPI, otherwise negative  Gastrointestinal: Negative for vomiting.  Endocrine:       Negative aside from HPI  Genitourinary:       Neg aside from HPI   Musculoskeletal:       Per HPI, otherwise negative  Skin: Negative.   Neurological: Negative for syncope.     Physical Exam Updated Vital Signs BP 111/85   Pulse 88   Temp 97.7 F (36.5 C) (Oral)   Resp (!) 24   SpO2 100%   Physical Exam  Constitutional: She is oriented to person, place, and time. No distress.  Sickly appearing elderly female awake and alert, speaking clearly.  HENT:  Head: Normocephalic.  Unremarkable abrasion, right forehead, no bleeding, no substantial swelling.  Eyes: Conjunctivae and EOM are normal.  Cardiovascular: Normal rate and regular rhythm.  Pulmonary/Chest: Effort normal and breath sounds normal. No stridor. No respiratory distress.  Abdominal: She exhibits no distension.  Musculoskeletal: She exhibits no edema.       Right hip: Normal.       Legs: Neurological: She is alert and oriented to person, place, and time. No cranial nerve deficit.  Skin: Skin is warm and dry.  Psychiatric: She has a normal mood and affect.  Nursing note and  vitals reviewed.    ED Treatments / Results  Labs (all labs ordered are listed, but only abnormal results are displayed) Labs Reviewed  BASIC METABOLIC PANEL - Abnormal; Notable for the following components:      Result Value   Sodium 132 (*)    Chloride 94 (*)    Glucose, Bld 124 (*)    BUN 35 (*)    Creatinine, Ser 1.17 (*)    GFR calc non Af Amer 40 (*)    GFR calc Af Amer 46 (*)    All other components within normal limits  CBC WITH DIFFERENTIAL/PLATELET - Abnormal; Notable for the following components:   WBC 14.0 (*)    RBC 3.24 (*)  Hemoglobin 9.9 (*)    HCT 29.6 (*)    Platelets 444 (*)    Neutro Abs 13.0 (*)    All other components within normal limits  PROTIME-INR  TYPE AND SCREEN  ABO/RH    EKG  EKG Interpretation  Date/Time:  Thursday August 17 2017 13:15:36 EST Ventricular Rate:  74 PR Interval:    QRS Duration: 81 QT Interval:  381 QTC Calculation: 423 R Axis:   -17 Text Interpretation:  Sinus rhythm Multiple premature complexes, vent & supraven Left ventricular hypertrophy Baseline wander in lead(s) V3 V4 V5 Artifact Abnormal ekg Confirmed by Carmin Muskrat [phone]) on 08/17/2017 1:20:50 PM       Radiology Dg Chest 1 View  Result Date: 08/17/2017 CLINICAL DATA:  Patient rolled out of bed last night and has obvious deformity of the left leg. The patient denies loss of consciousness. No chest complaints. History of hypertension EXAM: CHEST 1 VIEW COMPARISON:  Chest x-ray of Dec 09, 2016 FINDINGS: The lungs are adequately inflated. There is no focal infiltrate. There is no pleural effusion. The heart and pulmonary vascularity are normal. The mediastinum is normal in width. There calcification in the wall of the aortic arch. The bony thorax exhibits no acute abnormality. IMPRESSION: There is no active cardiopulmonary disease. Thoracic aortic atherosclerosis. Electronically Signed   By: David  Martinique M.D.   On: 08/17/2017 13:39   Ct Hip Left Wo  Contrast  Result Date: 08/17/2017 CLINICAL DATA:  The patient suffered a left hip fracture last night which. Initial EXAM: CT OF THE LEFT HIP WITHOUT CONTRAST TECHNIQUE: Multidetector CT imaging of the left hip was performed according to the standard protocol. Multiplanar CT image reconstructions were also generated. COMPARISON:  Plain films left hip earlier today. FINDINGS: Bones/Joint/Cartilage As seen on the comparison plain films, the patient has an intertrochanteric fracture of the left hip. The greater trochanter is comminuted. The femoral neck is angulated 90 degrees relative to the hip joint. Bones appear osteopenic. No other fracture is identified. Ligaments Suboptimally assessed by CT. Muscles and Tendons Intact. There is some edema and hematoma about the patient's fracture. Soft tissues Soft tissue contusion over the left hip is noted. Large stool ball is present in the rectum. Extensive sigmoid diverticulosis is present. Extensive atherosclerotic vascular disease is noted. IMPRESSION: Acute left intertrochanteric fracture as described above. Large stool ball in the rectum compatible with fecal impaction. Diverticulosis without diverticulitis. Atherosclerosis. Electronically Signed   By: Inge Rise M.D.   On: 08/17/2017 15:29   Dg Hip Unilat With Pelvis 2-3 Views Left  Result Date: 08/17/2017 CLINICAL DATA:  Patient rolled off a bed last night. Obvious abnormal rotation of the left leg. EXAM: DG HIP (WITH OR WITHOUT PELVIS) 2-3V LEFT COMPARISON:  Coronal and sagittal CT reconstructed images from a scan of November 25, 2016 FINDINGS: Patient has sustained an acute angulated fracture involving the base of the neck and the intertrochanteric region. The left femoral head remains appropriately positioned within the acetabulum. The left hemipelvis is normal. The sub trochanteric region of the left femur is normal. IMPRESSION: Acute angulated fracture involving the base of the neck and the  intertrochanteric region of the left hip. Electronically Signed   By: David  Martinique M.D.   On: 08/17/2017 13:38    Procedures Procedures (including critical care time)  Medications Ordered in ED Medications  fentaNYL (SUBLIMAZE) injection 50 mcg (50 mcg Intravenous Given 08/17/17 1336)     Initial Impression / Assessment and Plan /  ED Course  I have reviewed the triage vital signs and the nursing notes.  Pertinent labs & imaging results that were available during my care of the patient were reviewed by me and considered in my medical decision making (see chart for details).  Elderly female presents with hip pain and deformity following a fall. Patient's evaluation demonstrates left femoral neck fracture. The patient is awake alert, she is sickly appearing. However, she denies other complaints, and is hemodynamically unremarkable. I discussed patient's case with our orthopedic colleague, Dr. [name], and the patient was admitted for further evaluation and management.  Final Clinical Impressions(s) / ED Diagnoses  Fall, initial encounter Hip fracture, left, initial encounter   Carmin Muskrat, MD 08/17/17 1537

## 2017-08-17 NOTE — ED Notes (Signed)
ED TO INPATIENT HANDOFF REPORT  Name/Age/Gender Kristina [name] 84 y.o. female  Code Status Code Status History    Date Active Date Inactive Code Status Order ID Comments User Context   12/09/2016 18:46 12/12/2016 17:17 Full Code [id number]  Eugenie Filler, MD Inpatient   12/01/2016 20:42 12/03/2016 16:45 Full Code [id number]  Gardiner Barefoot, NP Inpatient   12/01/2016 16:53 12/01/2016 20:42 DNR [id number]  Samella Parr, NP Inpatient   12/01/2016 16:13 12/01/2016 16:53 Full Code [id number]  Samella Parr, NP Inpatient      Home/SNF/Other Home  Chief Complaint Left Hip Injury Possbile Fracture   Level of Care/Admitting Diagnosis ED Disposition    ED Disposition Condition [facility] Area: [place] [[id number]]  Level of Care: Med-Surg [16]  Diagnosis: Hip fracture [facility]) [[id number]]  Admitting Physician: Morrison Old  Attending Physician: Morrison Old  Estimated length of stay: 3 - 4 days  Certification:: I certify this patient will need inpatient services for at least 2 midnights  Bed request comments: medsurg  PT Class (Do Not Modify): Inpatient [101]  PT Acc Code (Do Not Modify): Private [1]       Medical History Past Medical History:  Diagnosis Date  . AAA (abdominal aortic aneurysm) (HCC)    Mild - measuring 2.8x2.8cm  . Acute hyponatremia 11/2016  . 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 HOH (hard of [name])   . Hyperlipidemia    Well-controlled  . Hypertension   . PAD (peripheral artery disease) (HCC)    LEA Dopplers: Right external iliac 40%, right mid SFA 70-99% with occlusion in Hunter's canal. Angiography. LEFT common iliac 60%, left SFA 70-9%, popliteal 50%; RIGHT posterior tibial occluded;; carotid Dopplers in 2009: Less than 50% stenosis.  . Renal artery stenosis (HCC)     Allergies Allergies  Allergen  Reactions  . Lipitor [Atorvastatin] Other (See Comments)    Reaction:  Unknown   . Pletal [Cilostazol] Other (See Comments)    Reaction:  Unknown   . Simvastatin     MYALGIAS    IV Location/Drains/Wounds Patient Lines/Drains/Airways Status   Active Line/Drains/Airways    None          Labs/Imaging Results for orders placed or performed during the hospital encounter of 08/17/17 (from the past 48 hour(s))  Basic metabolic panel     Status: Abnormal   Collection Time: 08/17/17  1:19 PM  Result Value Ref Range   Sodium 132 (L) 135 - 145 mmol/L   Potassium 4.0 3.5 - 5.1 mmol/L   Chloride 94 (L) 101 - 111 mmol/L   CO2 28 22 - 32 mmol/L   Glucose, Bld 124 (H) 65 - 99 mg/dL   BUN 35 (H) 6 - 20 mg/dL   Creatinine, Ser 1.17 (H) 0.44 - 1.00 mg/dL   Calcium 9.5 8.9 - 10.3 mg/dL   GFR calc non Af Amer 40 (L) >60 mL/min   GFR calc Af Amer 46 (L) >60 mL/min    Comment: (NOTE) The eGFR has been calculated using the CKD EPI equation. This calculation has not been validated in all clinical situations. eGFR's persistently <60 mL/min signify possible Chronic Kidney Disease.    Anion gap 10 5 - 15  CBC WITH DIFFERENTIAL     Status: Abnormal   Collection Time: 08/17/17  1:19 PM  Result Value Ref  Range   WBC 14.0 (H) 4.0 - 10.5 K/uL   RBC 3.24 (L) 3.87 - 5.11 MIL/uL   Hemoglobin 9.9 (L) 12.0 - 15.0 g/dL   HCT 29.6 (L) 36.0 - 46.0 %   MCV 91.4 78.0 - 100.0 fL   MCH 30.6 26.0 - 34.0 pg   MCHC 33.4 30.0 - 36.0 g/dL   RDW 13.4 11.5 - 15.5 %   Platelets 444 (H) 150 - 400 K/uL   Neutrophils Relative % 92 %   Neutro Abs 13.0 (H) 1.7 - 7.7 K/uL   Lymphocytes Relative 5 %   Lymphs Abs 0.7 0.7 - 4.0 K/uL   Monocytes Relative 3 %   Monocytes Absolute 0.4 0.1 - 1.0 K/uL   Eosinophils Relative 0 %   Eosinophils Absolute 0.0 0.0 - 0.7 K/uL   Basophils Relative 0 %   Basophils Absolute 0.0 0.0 - 0.1 K/uL  Protime-INR     Status: None   Collection Time: 08/17/17  1:19 PM  Result Value Ref  Range   Prothrombin Time 12.3 11.4 - 15.2 seconds   INR 0.92   ABO/Rh     Status: None   Collection Time: 08/17/17  1:19 PM  Result Value Ref Range   ABO/RH(D) A POS   Type and screen [place]     Status: None   Collection Time: 08/17/17  1:44 PM  Result Value Ref Range   ABO/RH(D) A POS    Antibody Screen NEG    Sample Expiration 08/20/2017    Dg Chest 1 View  Result Date: 08/17/2017 CLINICAL DATA:  Patient rolled out of bed last night and has obvious deformity of the left leg. The patient denies loss of consciousness. No chest complaints. History of hypertension EXAM: CHEST 1 VIEW COMPARISON:  Chest x-ray of Dec 09, 2016 FINDINGS: The lungs are adequately inflated. There is no focal infiltrate. There is no pleural effusion. The heart and pulmonary vascularity are normal. The mediastinum is normal in width. There calcification in the wall of the aortic arch. The bony thorax exhibits no acute abnormality. IMPRESSION: There is no active cardiopulmonary disease. Thoracic aortic atherosclerosis. Electronically Signed   By: David  Martinique M.D.   On: 08/17/2017 13:39   Ct Hip Left Wo Contrast  Result Date: 08/17/2017 CLINICAL DATA:  The patient suffered a left hip fracture last night which. Initial EXAM: CT OF THE LEFT HIP WITHOUT CONTRAST TECHNIQUE: Multidetector CT imaging of the left hip was performed according to the standard protocol. Multiplanar CT image reconstructions were also generated. COMPARISON:  Plain films left hip earlier today. FINDINGS: Bones/Joint/Cartilage As seen on the comparison plain films, the patient has an intertrochanteric fracture of the left hip. The greater trochanter is comminuted. The femoral neck is angulated 90 degrees relative to the hip joint. Bones appear osteopenic. No other fracture is identified. Ligaments Suboptimally assessed by CT. Muscles and Tendons Intact. There is some edema and hematoma about the patient's fracture. Soft tissues  Soft tissue contusion over the left hip is noted. Large stool ball is present in the rectum. Extensive sigmoid diverticulosis is present. Extensive atherosclerotic vascular disease is noted. IMPRESSION: Acute left intertrochanteric fracture as described above. Large stool ball in the rectum compatible with fecal impaction. Diverticulosis without diverticulitis. Atherosclerosis. Electronically Signed   By: Inge Rise M.D.   On: 08/17/2017 15:29   Dg Hip Unilat With Pelvis 2-3 Views Left  Result Date: 08/17/2017 CLINICAL DATA:  Patient rolled off a bed last  night. Obvious abnormal rotation of the left leg. EXAM: DG HIP (WITH OR WITHOUT PELVIS) 2-3V LEFT COMPARISON:  Coronal and sagittal CT reconstructed images from a scan of November 25, 2016 FINDINGS: Patient has sustained an acute angulated fracture involving the base of the neck and the intertrochanteric region. The left femoral head remains appropriately positioned within the acetabulum. The left hemipelvis is normal. The sub trochanteric region of the left femur is normal. IMPRESSION: Acute angulated fracture involving the base of the neck and the intertrochanteric region of the left hip. Electronically Signed   By: David  Martinique M.D.   On: 08/17/2017 13:38    Pending Labs [employer] (From admission, onward)   Start     Ordered   Signed and Occupational hygienist morning,   R     Signed and Held   Signed and Held  CBC  Tomorrow morning,   R     Signed and Held      Vitals/Pain Today's Vitals   08/17/17 1530 08/17/17 1600 08/17/17 1630 08/17/17 1634  BP: (!) 175/87 (!) 168/83 (!) 172/71 (!) 172/71  Pulse: 86 77 94 90  Resp: 17 19 20 15   Temp:      TempSrc:      SpO2: 98% 98% 96% 98%  PainSc:        Isolation Precautions No active isolations  Medications Medications  sodium phosphate (FLEET) 7-19 GM/118ML enema 1 enema (not administered)  morphine 2 MG/ML injection 2 mg (2 mg Intravenous Given 08/17/17 1645)   oxyCODONE (Oxy IR/ROXICODONE) immediate release tablet 5 mg (not administered)  fentaNYL (SUBLIMAZE) injection 50 mcg (50 mcg Intravenous Given 08/17/17 1336)  bisacodyl (DULCOLAX) suppository 10 mg (10 mg Rectal Given 08/17/17 1645)    Mobility non-ambulatory

## 2017-08-18 ENCOUNTER — Inpatient Hospital Stay (HOSPITAL_COMMUNITY): Payer: Medicare Other

## 2017-08-18 ENCOUNTER — Encounter (HOSPITAL_COMMUNITY)
Admission: EM | Disposition: A | Discharge status: Skilled Nursing Facility | Payer: Self-pay | Source: Home / Self Care | Attending: Internal Medicine

## 2017-08-18 ENCOUNTER — Inpatient Hospital Stay (HOSPITAL_COMMUNITY): Payer: Medicare Other | Admitting: Anesthesiology

## 2017-08-18 ENCOUNTER — Encounter (HOSPITAL_COMMUNITY): Payer: Self-pay | Admitting: *Deleted

## 2017-08-18 HISTORY — PX: INTRAMEDULLARY (IM) NAIL INTERTROCHANTERIC: SHX5875

## 2017-08-18 LAB — CBC
HEMATOCRIT: 27 % — AB (ref 36.0–46.0)
HEMATOCRIT: 27.5 % — AB (ref 36.0–46.0)
HEMOGLOBIN: 8.9 g/dL — AB (ref 12.0–15.0)
HEMOGLOBIN: 8.8 g/dL — AB (ref 12.0–15.0)
MCH: 30.6 pg (ref 26.0–34.0)
MCH: 30.1 pg (ref 26.0–34.0)
MCHC: 33 g/dL (ref 30.0–36.0)
MCHC: 32 g/dL (ref 30.0–36.0)
MCV: 92.8 fL (ref 78.0–100.0)
MCV: 94.2 fL (ref 78.0–100.0)
Platelets: 365 10*3/uL (ref 150–400)
Platelets: 385 10*3/uL (ref 150–400)
RBC: 2.91 MIL/uL — AB (ref 3.87–5.11)
RBC: 2.92 MIL/uL — AB (ref 3.87–5.11)
RDW: 13.8 % (ref 11.5–15.5)
RDW: 13.6 % (ref 11.5–15.5)
WBC: 15 10*3/uL — ABNORMAL HIGH (ref 4.0–10.5)
WBC: 17.8 10*3/uL — AB (ref 4.0–10.5)

## 2017-08-18 LAB — BASIC METABOLIC PANEL
ANION GAP: 8 (ref 5–15)
BUN: 41 mg/dL — ABNORMAL HIGH (ref 6–20)
CALCIUM: 9.1 mg/dL (ref 8.9–10.3)
CHLORIDE: 98 mmol/L — AB (ref 101–111)
CO2: 27 mmol/L (ref 22–32)
Creatinine, Ser: 1.31 mg/dL — ABNORMAL HIGH (ref 0.44–1.00)
GFR calc non Af Amer: 35 mL/min — ABNORMAL LOW (ref >60)
GFR, EST AFRICAN AMERICAN: 41 mL/min — AB (ref >60)
GLUCOSE: 105 mg/dL — AB (ref 65–99)
POTASSIUM: 4.2 mmol/L (ref 3.5–5.1)
Sodium: 133 mmol/L — ABNORMAL LOW (ref 135–145)

## 2017-08-18 LAB — SURGICAL PCR SCREEN
MRSA, PCR: NEGATIVE
Staphylococcus aureus: NEGATIVE

## 2017-08-18 LAB — CREATININE, SERUM
CREATININE: 1.37 mg/dL — AB (ref 0.44–1.00)
GFR calc Af Amer: 38 mL/min — ABNORMAL LOW (ref >60)
GFR, EST NON AFRICAN AMERICAN: 33 mL/min — AB (ref >60)

## 2017-08-18 LAB — ABO/RH: ABO/RH(D): A POS

## 2017-08-18 SURGERY — FIXATION, FRACTURE, INTERTROCHANTERIC, WITH INTRAMEDULLARY ROD
Anesthesia: General | Site: Shoulder | Laterality: Left

## 2017-08-18 MED ORDER — FENTANYL CITRATE (PF) 250 MCG/5ML IJ SOLN
INTRAMUSCULAR | Status: AC
  Filled 2017-08-18: qty 5

## 2017-08-18 MED ORDER — FENTANYL CITRATE (PF) 100 MCG/2ML IJ SOLN
25.0000 ug | Freq: Once | INTRAMUSCULAR | Status: AC
  Administered 2017-08-18: 01:00:00 25 ug via INTRAVENOUS
  Filled 2017-08-18: qty 2

## 2017-08-18 MED ORDER — MORPHINE SULFATE (PF) 2 MG/ML IV SOLN
0.5000 mg | INTRAVENOUS | Status: DC | PRN
  Administered 2017-08-18 – 2017-08-20 (×6): 0.5 mg via INTRAVENOUS
  Filled 2017-08-18 (×6): qty 1

## 2017-08-18 MED ORDER — PROMETHAZINE HCL 25 MG/ML IJ SOLN: 6.2500 mg | INTRAMUSCULAR | Status: DC | PRN

## 2017-08-18 MED ORDER — ENOXAPARIN SODIUM 40 MG/0.4ML ~~LOC~~ SOLN: 40.0000 mg | Freq: Every day | SUBCUTANEOUS | 0 refills | Status: DC

## 2017-08-18 MED ORDER — CEFAZOLIN SODIUM-DEXTROSE 2-4 GM/100ML-% IV SOLN
2.0000 g | Freq: Four times a day (QID) | INTRAVENOUS | Status: AC
  Administered 2017-08-18 – 2017-08-19 (×3): 2 g via INTRAVENOUS
  Filled 2017-08-18 (×3): qty 100

## 2017-08-18 MED ORDER — SODIUM CHLORIDE 0.9 % IV SOLN
INTRAVENOUS | Status: DC
  Administered 2017-08-18: 18:00:00 via INTRAVENOUS

## 2017-08-18 MED ORDER — LACTATED RINGERS IV SOLN
INTRAVENOUS | Status: DC
  Administered 2017-08-18: 15:00:00 via INTRAVENOUS

## 2017-08-18 MED ORDER — METOCLOPRAMIDE HCL 5 MG PO TABS: 5.0000 mg | ORAL_TABLET | Freq: Three times a day (TID) | ORAL | Status: DC | PRN

## 2017-08-18 MED ORDER — LIDOCAINE 2% (20 MG/ML) 5 ML SYRINGE
INTRAMUSCULAR | Status: DC | PRN
  Administered 2017-08-18: 60 mg via INTRAVENOUS

## 2017-08-18 MED ORDER — METHOCARBAMOL 500 MG PO TABS
500.0000 mg | ORAL_TABLET | Freq: Four times a day (QID) | ORAL | Status: DC | PRN
  Administered 2017-08-18 – 2017-08-21 (×6): 500 mg via ORAL
  Filled 2017-08-18 (×7): qty 1

## 2017-08-18 MED ORDER — ENOXAPARIN SODIUM 40 MG/0.4ML ~~LOC~~ SOLN: 40.0000 mg | SUBCUTANEOUS | Status: DC

## 2017-08-18 MED ORDER — ONDANSETRON HCL 4 MG/2ML IJ SOLN
4.0000 mg | Freq: Four times a day (QID) | INTRAMUSCULAR | Status: DC | PRN
  Administered 2017-08-19: 4 mg via INTRAVENOUS
  Filled 2017-08-18 (×2): qty 2

## 2017-08-18 MED ORDER — MENTHOL 3 MG MT LOZG: 1.0000 | LOZENGE | OROMUCOSAL | Status: DC | PRN

## 2017-08-18 MED ORDER — PROPOFOL 10 MG/ML IV BOLUS
INTRAVENOUS | Status: DC | PRN
  Administered 2017-08-18: 60 mg via INTRAVENOUS

## 2017-08-18 MED ORDER — PHENOL 1.4 % MT LIQD: 1.0000 | OROMUCOSAL | Status: DC | PRN

## 2017-08-18 MED ORDER — PHENYLEPHRINE 40 MCG/ML (10ML) SYRINGE FOR IV PUSH (FOR BLOOD PRESSURE SUPPORT)
PREFILLED_SYRINGE | INTRAVENOUS | Status: DC | PRN
  Administered 2017-08-18 (×4): 80 ug via INTRAVENOUS

## 2017-08-18 MED ORDER — PROPOFOL 10 MG/ML IV BOLUS
INTRAVENOUS | Status: AC
  Filled 2017-08-18: qty 20

## 2017-08-18 MED ORDER — ONDANSETRON HCL 4 MG/2ML IJ SOLN
INTRAMUSCULAR | Status: DC | PRN
  Administered 2017-08-18: 4 mg via INTRAVENOUS

## 2017-08-18 MED ORDER — ACETAMINOPHEN 325 MG PO TABS
650.0000 mg | ORAL_TABLET | Freq: Four times a day (QID) | ORAL | Status: DC | PRN
  Administered 2017-08-19 (×2): 650 mg via ORAL
  Filled 2017-08-18 (×2): qty 2

## 2017-08-18 MED ORDER — HYDROMORPHONE HCL 1 MG/ML IJ SOLN: 0.2500 mg | INTRAMUSCULAR | Status: DC | PRN

## 2017-08-18 MED ORDER — METOCLOPRAMIDE HCL 5 MG/ML IJ SOLN
5.0000 mg | Freq: Three times a day (TID) | INTRAMUSCULAR | Status: DC | PRN
  Administered 2017-08-19 (×2): 10 mg via INTRAVENOUS
  Filled 2017-08-18 (×2): qty 2

## 2017-08-18 MED ORDER — PHENYLEPHRINE HCL 10 MG/ML IJ SOLN
INTRAVENOUS | Status: DC | PRN
  Administered 2017-08-18: 30 ug/min via INTRAVENOUS

## 2017-08-18 MED ORDER — ACETAMINOPHEN 650 MG RE SUPP
650.0000 mg | Freq: Four times a day (QID) | RECTAL | Status: DC | PRN
Start: 2017-08-18 — End: 2017-08-21

## 2017-08-18 MED ORDER — CEFAZOLIN SODIUM-DEXTROSE 2-4 GM/100ML-% IV SOLN
2.0000 g | INTRAVENOUS | Status: AC
  Administered 2017-08-18: 2 g via INTRAVENOUS

## 2017-08-18 MED ORDER — METHOCARBAMOL 1000 MG/10ML IJ SOLN
500.0000 mg | Freq: Four times a day (QID) | INTRAVENOUS | Status: DC | PRN
  Filled 2017-08-18: qty 5

## 2017-08-18 MED ORDER — POVIDONE-IODINE 10 % EX SWAB: 2.0000 "application " | Freq: Once | CUTANEOUS | Status: DC

## 2017-08-18 MED ORDER — FENTANYL CITRATE (PF) 100 MCG/2ML IJ SOLN
INTRAMUSCULAR | Status: DC | PRN
  Administered 2017-08-18 (×2): 25 ug via INTRAVENOUS

## 2017-08-18 MED ORDER — OXYCODONE HCL 5 MG PO TABS
5.0000 mg | ORAL_TABLET | ORAL | Status: DC | PRN
  Administered 2017-08-18 – 2017-08-19 (×2): 10 mg via ORAL
  Filled 2017-08-18: qty 1
  Filled 2017-08-18: qty 2

## 2017-08-18 MED ORDER — HYDROCODONE-ACETAMINOPHEN 7.5-325 MG PO TABS: 1.0000 | ORAL_TABLET | Freq: Once | ORAL | Status: DC | PRN

## 2017-08-18 MED ORDER — ALUM & MAG HYDROXIDE-SIMETH 200-200-20 MG/5ML PO SUSP: 30.0000 mL | ORAL | Status: DC | PRN

## 2017-08-18 MED ORDER — HYDROCODONE-ACETAMINOPHEN 5-325 MG PO TABS: 1.0000 | ORAL_TABLET | Freq: Four times a day (QID) | ORAL | Status: DC | PRN

## 2017-08-18 MED ORDER — ACETAMINOPHEN 10 MG/ML IV SOLN: 1000.0000 mg | Freq: Once | INTRAVENOUS | Status: DC | PRN

## 2017-08-18 MED ORDER — ONDANSETRON HCL 4 MG PO TABS
4.0000 mg | ORAL_TABLET | Freq: Four times a day (QID) | ORAL | Status: DC | PRN
Start: 2017-08-18 — End: 2017-08-21

## 2017-08-18 MED ORDER — HYDROCODONE-ACETAMINOPHEN 7.5-325 MG PO TABS: 1.0000 | ORAL_TABLET | Freq: Four times a day (QID) | ORAL | 0 refills | Status: DC | PRN

## 2017-08-18 MED ORDER — VANCOMYCIN HCL IN DEXTROSE 1-5 GM/200ML-% IV SOLN: 1000.0000 mg | INTRAVENOUS | Status: DC

## 2017-08-18 MED ORDER — TRANEXAMIC ACID 1000 MG/10ML IV SOLN
1000.0000 mg | INTRAVENOUS | Status: AC
  Administered 2017-08-18: 1000 mg via INTRAVENOUS
  Filled 2017-08-18: qty 1100

## 2017-08-18 MED ORDER — CEFAZOLIN SODIUM-DEXTROSE 2-4 GM/100ML-% IV SOLN
INTRAVENOUS | Status: AC
  Filled 2017-08-18: qty 100

## 2017-08-18 MED ORDER — EPHEDRINE SULFATE-NACL 50-0.9 MG/10ML-% IV SOSY
PREFILLED_SYRINGE | INTRAVENOUS | Status: DC | PRN
  Administered 2017-08-18: 10 mg via INTRAVENOUS

## 2017-08-18 SURGICAL SUPPLY — 39 items
BNDG COHESIVE 4X5 TAN NS LF (GAUZE/BANDAGES/DRESSINGS) ×3 IMPLANT
BNDG COHESIVE 6X5 TAN STRL LF (GAUZE/BANDAGES/DRESSINGS) ×2 IMPLANT
BNDG GAUZE ELAST 4 BULKY (GAUZE/BANDAGES/DRESSINGS) ×3 IMPLANT
COVER PERINEAL POST (MISCELLANEOUS) ×3 IMPLANT
COVER SURGICAL LIGHT HANDLE (MISCELLANEOUS) ×3 IMPLANT
DRAPE STERI IOBAN 125X83 (DRAPES) ×3 IMPLANT
DRSG MEPILEX BORDER 4X12 (GAUZE/BANDAGES/DRESSINGS) ×2 IMPLANT
DRSG MEPILEX BORDER 4X4 (GAUZE/BANDAGES/DRESSINGS) ×3 IMPLANT
DRSG MEPILEX BORDER 4X8 (GAUZE/BANDAGES/DRESSINGS) ×3 IMPLANT
DRSG PAD ABDOMINAL 8X10 ST (GAUZE/BANDAGES/DRESSINGS) ×6 IMPLANT
DURAPREP 26ML APPLICATOR (WOUND CARE) ×3 IMPLANT
ELECT REM PT RETURN 9FT ADLT (ELECTROSURGICAL) ×3
ELECTRODE REM PT RTRN 9FT ADLT (ELECTROSURGICAL) ×1 IMPLANT
GAUZE SPONGE 4X4 12PLY STRL (GAUZE/BANDAGES/DRESSINGS) ×2 IMPLANT
GLOVE SKINSENSE NS SZ7.5 (GLOVE) ×4
GLOVE SKINSENSE STRL SZ7.5 (GLOVE) ×2 IMPLANT
GOWN STRL REIN XL XLG (GOWN DISPOSABLE) ×3 IMPLANT
GUIDE PIN 3.2X343 (PIN) ×2
GUIDE PIN 3.2X343MM (PIN) ×6
KIT BASIN OR (CUSTOM PROCEDURE TRAY) ×3 IMPLANT
KIT ROOM TURNOVER OR (KITS) ×3 IMPLANT
MANIFOLD NEPTUNE II (INSTRUMENTS) ×3 IMPLANT
NAIL LEFT 10X36 (Nail) ×2 IMPLANT
NS IRRIG 1000ML POUR BTL (IV SOLUTION) ×3 IMPLANT
PACK GENERAL/GYN (CUSTOM PROCEDURE TRAY) ×3 IMPLANT
PAD ARMBOARD 7.5X6 YLW CONV (MISCELLANEOUS) ×6 IMPLANT
PAD CAST 4YDX4 CTTN HI CHSV (CAST SUPPLIES) ×2 IMPLANT
PADDING CAST COTTON 4X4 STRL (CAST SUPPLIES) ×6
PIN GUIDE 3.2X343MM (PIN) IMPLANT
SCREW LAG COMPR KIT 85/80 (Screw) ×2 IMPLANT
STAPLER VISISTAT 35W (STAPLE) ×3 IMPLANT
SUT ETHILON 3 0 PS 1 (SUTURE) ×4 IMPLANT
SUT VIC AB 0 CT1 27 (SUTURE) ×3
SUT VIC AB 0 CT1 27XBRD ANBCTR (SUTURE) ×1 IMPLANT
SUT VIC AB 2-0 CT1 27 (SUTURE) ×6
SUT VIC AB 2-0 CT1 TAPERPNT 27 (SUTURE) ×1 IMPLANT
TOWEL OR 17X24 6PK STRL BLUE (TOWEL DISPOSABLE) ×3 IMPLANT
TOWEL OR 17X26 10 PK STRL BLUE (TOWEL DISPOSABLE) ×3 IMPLANT
WATER STERILE IRR 1000ML POUR (IV SOLUTION) ×3 IMPLANT

## 2017-08-18 NOTE — Progress Notes (Signed)
PROGRESS NOTE    Kristina [name]  MWU:[id number] DOB: 1926/04/10 DOA: 08/17/2017 PCP: [name], MD  Brief Narrative: 84 y.o. female with past medical history relevant for hypertension, chronic anemia and CKD stage III who presents to the ED with complaints of left hip fall after falling out of bed overnight.  Denies head injury or loss of consciousness, denies chest pains, palpitations ,dizziness or pleuritic symptoms  No fevers, no chills, no vomiting, no diarrhea  Additional history obtained from patient daughter at bedside  In the ED patient rated the pain a 10/10 with minimal movement, she did receive IV opiates for pain relief  Imaging studies confirms left hip fracture and  fecal impaction  ED physician discussed this case with on-call orthopedic surgeon Dr [name] who advised admission to the hospital possibly [facility] for surgical correction of left hip fracture    Assessment & Plan:   Principal Problem:   Lt Hip fracture ([place]) Active Problems:   Essential hypertension   Chronic kidney disease (CKD), stage III (moderate) (HCC)   Hyponatremia   Fecal impaction in rectum ([place])   1] status post mechanical fall and left hip fracture patient to go for ORIF today to [facility] by Dr.Xu.  Will order PT eval and social worker evaluation for possible snf placement.  Continue slow IV hydration.  2] fecal impaction/ constipation-continue with laxatives and stool softeners and if no BM will need possible manual disimpaction by the nursing staff.  3] anemia of chronic kidney disease follow-up H&H-transfuse if needed.  4] history of hypertension restart hydralazine 50 mg twice a day when she is able to take p.o.  We will then continue IV hydralazine.  5] leukocytosis rule out any infection.  I I will order a UA and culture.  Chest x-ray showed no active lung disease.   DVT prophylaxis: SCD  Code Status: Full code Family Communication: No family  available Disposition Plan: Most likely will DC to skilled nursing facility once stable after surgery. Consultants: Bobette Mo  Procedures: None. Antimicrobials:  None Subjective: Patient sleeping just received a dose of pain medication.   Objective: Vitals:   08/17/17 2148 08/18/17 0630 08/18/17 1248 08/18/17 1325  BP: (!) 165/60 (!) 110/44 101/60   Pulse: 78 99 (!) 105   Resp: 18 (!) 21 (!) 24   Temp: 97.9 F (36.6 C) 97.8 F (36.6 C) 98.6 F (37 C)   TempSrc: Oral Axillary Axillary   SpO2: 97% 94% 97%   Weight:    36.3 kg (80 lb)  Height:    4\' 11"  (1.499 m)    Intake/Output Summary (Last 24 hours) at 08/18/2017 1432 Last data filed at 08/18/2017 1234 Gross per 24 hour  Intake 726.67 ml  Output 1195 ml  Net -468.33 ml   Filed Weights   08/18/17 1325  Weight: 36.3 kg (80 lb)    Examination:  General exam: Appears calm and comfortable  Respiratory system: Clear to auscultation. Respiratory effort normal. Cardiovascular system: S1 & S2 heard, RRR. No JVD, murmurs, rubs, gallops or clicks. No pedal edema. Gastrointestinal system: Abdomen is nondistended, soft and nontender. No organomegaly or masses felt. Normal bowel sounds heard. Central nervous system: Alert and oriented. No focal neurological deficits. Extremities: Symmetric 5 x 5 power. Skin: No rashes, lesions or ulcers Psychiatry: Judgement and insight appear normal. Mood & affect appropriate.     Data Reviewed: I have personally reviewed following labs and imaging studies  CBC: Recent Labs  Lab  08/17/17 1319 08/18/17 0547  WBC 14.0* 15.0*  NEUTROABS 13.0*  --   HGB 9.9* 8.9*  HCT 29.6* 27.0*  MCV 91.4 92.8  PLT 444* 277   Basic Metabolic Panel: Recent Labs  Lab 08/17/17 1319 08/18/17 0547  NA 132* 133*  K 4.0 4.2  CL 94* 98*  CO2 28 27  GLUCOSE 124* 105*  BUN 35* 41*  CREATININE 1.17* 1.31*  CALCIUM 9.5 9.1   GFR: Estimated Creatinine Clearance: 16.7 mL/min (A) (by C-G formula based on  SCr of 1.31 mg/dL (H)). Liver Function Tests: No results for input(s): AST, ALT, ALKPHOS, BILITOT, PROT, ALBUMIN in the last 168 hours. No results for input(s): LIPASE, AMYLASE in the last 168 hours. No results for input(s): AMMONIA in the last 168 hours. Coagulation Profile: Recent Labs  Lab 08/17/17 1319  INR 0.92   Cardiac Enzymes: No results for input(s): CKTOTAL, CKMB, CKMBINDEX, TROPONINI in the last 168 hours. BNP (last 3 results) No results for input(s): PROBNP in the last 8760 hours. HbA1C: No results for input(s): HGBA1C in the last 72 hours. CBG: No results for input(s): GLUCAP in the last 168 hours. Lipid Profile: No results for input(s): CHOL, HDL, LDLCALC, TRIG, CHOLHDL, LDLDIRECT in the last 72 hours. Thyroid Function Tests: No results for input(s): TSH, T4TOTAL, FREET4, T3FREE, THYROIDAB in the last 72 hours. Anemia Panel: No results for input(s): VITAMINB12, FOLATE, FERRITIN, TIBC, IRON, RETICCTPCT in the last 72 hours. Sepsis Labs: No results for input(s): PROCALCITON, LATICACIDVEN in the last 168 hours.  Recent Results (from the past 240 hour(s))  Surgical PCR screen     Status: None   Collection Time: 08/17/17 11:10 PM  Result Value Ref Range Status   MRSA, PCR NEGATIVE NEGATIVE Final   Staphylococcus aureus NEGATIVE NEGATIVE Final    Comment: (NOTE) The Xpert SA Assay (FDA approved for NASAL specimens in patients 64 years of age and older), is one component of a comprehensive surveillance program. It is not intended to diagnose infection nor to guide or monitor treatment.          Radiology Studies: Dg Chest 1 View  Result Date: 08/17/2017 CLINICAL DATA:  Patient rolled out of bed last night and has obvious deformity of the left leg. The patient denies loss of consciousness. No chest complaints. History of hypertension EXAM: CHEST 1 VIEW COMPARISON:  Chest x-ray of Dec 09, 2016 FINDINGS: The lungs are adequately inflated. There is no focal  infiltrate. There is no pleural effusion. The heart and pulmonary vascularity are normal. The mediastinum is normal in width. There calcification in the wall of the aortic arch. The bony thorax exhibits no acute abnormality. IMPRESSION: There is no active cardiopulmonary disease. Thoracic aortic atherosclerosis. Electronically Signed   By: David  Martinique M.D.   On: 08/17/2017 13:39   Ct Hip Left Wo Contrast  Result Date: 08/17/2017 CLINICAL DATA:  The patient suffered a left hip fracture last night which. Initial EXAM: CT OF THE LEFT HIP WITHOUT CONTRAST TECHNIQUE: Multidetector CT imaging of the left hip was performed according to the standard protocol. Multiplanar CT image reconstructions were also generated. COMPARISON:  Plain films left hip earlier today. FINDINGS: Bones/Joint/Cartilage As seen on the comparison plain films, the patient has an intertrochanteric fracture of the left hip. The greater trochanter is comminuted. The femoral neck is angulated 90 degrees relative to the hip joint. Bones appear osteopenic. No other fracture is identified. Ligaments Suboptimally assessed by CT. Muscles and Tendons Intact. There is some  edema and hematoma about the patient's fracture. Soft tissues Soft tissue contusion over the left hip is noted. Large stool ball is present in the rectum. Extensive sigmoid diverticulosis is present. Extensive atherosclerotic vascular disease is noted. IMPRESSION: Acute left intertrochanteric fracture as described above. Large stool ball in the rectum compatible with fecal impaction. Diverticulosis without diverticulitis. Atherosclerosis. Electronically Signed   By: Inge Rise M.D.   On: 08/17/2017 15:29   Dg Hip Unilat With Pelvis 2-3 Views Left  Result Date: 08/17/2017 CLINICAL DATA:  Patient rolled off a bed last night. Obvious abnormal rotation of the left leg. EXAM: DG HIP (WITH OR WITHOUT PELVIS) 2-3V LEFT COMPARISON:  Coronal and sagittal CT reconstructed images from a  scan of November 25, 2016 FINDINGS: Patient has sustained an acute angulated fracture involving the base of the neck and the intertrochanteric region. The left femoral head remains appropriately positioned within the acetabulum. The left hemipelvis is normal. The sub trochanteric region of the left femur is normal. IMPRESSION: Acute angulated fracture involving the base of the neck and the intertrochanteric region of the left hip. Electronically Signed   By: David  Martinique M.D.   On: 08/17/2017 13:38        Scheduled Meds: . [MAR Hold] acidophilus  1 capsule Oral QHS  . [MAR Hold] feeding supplement (ENSURE ENLIVE)  237 mL Oral BID BM  . [MAR Hold] hydrALAZINE  50 mg Oral BID  . [MAR Hold] polyethylene glycol  17 g Oral BID  . povidone-iodine  2 application Topical Once  . [MAR Hold] senna  2 tablet Oral BID  . [MAR Hold] sodium chloride flush  3 mL Intravenous Q12H   Continuous Infusions: . [MAR Hold] sodium chloride    . sodium chloride 50 mL/hr at 08/17/17 1940  . ceFAZolin    .  ceFAZolin (ANCEF) IV    . lactated ringers    . tranexamic acid       LOS: 1 day      Georgette Shell, MD [facility] If 7PM-7AM, please contact night-coverage [email or web address] Password [facility] 08/18/2017, 2:32 PM

## 2017-08-18 NOTE — Discharge Instructions (Signed)
° ° °  1. Change dressings as needed °2. May shower but keep incisions covered and dry °3. Take lovenox to prevent blood clots °4. Take stool softeners as needed °5. Take pain meds as needed ° °

## 2017-08-18 NOTE — Transfer of Care (Signed)
Immediate Anesthesia Transfer of Care Note  Patient: Kristina [name]  Procedure(s) Performed: INTRAMEDULLARY (IM) NAIL INTERTROCHANTRIC (Left Shoulder)  Patient Location: PACU  Anesthesia Type:General  Level of Consciousness: drowsy and patient cooperative  Airway & Oxygen Therapy: Patient Spontanous Breathing and Patient connected to nasal cannula oxygen  Post-op Assessment: Report given to RN, Post -op Vital signs reviewed and stable and Patient moving all extremities X 4  Post vital signs: Reviewed and stable  Last Vitals:  Vitals:   08/18/17 1248 08/18/17 1613  BP: 101/60 (!) 104/59  Pulse: (!) 105 76  Resp: (!) 24 14  Temp: 37 C 36.6 C  SpO2: 97% 100%    Last Pain:  Vitals:   08/18/17 1248  TempSrc: Axillary  PainSc:       Patients Stated Pain Goal: 4 (32/54/98 2641)  Complications: No apparent anesthesia complications

## 2017-08-18 NOTE — Anesthesia Preprocedure Evaluation (Signed)
Anesthesia Evaluation  Patient identified by MRN, date of birth, ID band Patient confused    Reviewed: Allergy & Precautions, NPO status , Patient's Chart, lab work & pertinent test results  Airway Mallampati: II  TM Distance: >3 FB Neck ROM: Full    Dental no notable dental hx. (+) Edentulous Upper, Edentulous Lower   Pulmonary neg pulmonary ROS, former smoker,    Pulmonary exam normal breath sounds clear to auscultation       Cardiovascular Exercise Tolerance: Good hypertension, + Peripheral Vascular Disease  negative cardio ROS Normal cardiovascular exam Rhythm:Regular Rate:Normal     Neuro/Psych negative neurological ROS  negative psych ROS   GI/Hepatic negative GI ROS, Neg liver ROS,   Endo/Other  negative endocrine ROS  Renal/GU Renal diseasenegative Renal ROS  negative genitourinary   Musculoskeletal negative musculoskeletal ROS (+)   Abdominal   Peds negative pediatric ROS (+)  Hematology negative hematology ROS (+) anemia ,   Anesthesia Other Findings   Reproductive/Obstetrics negative OB ROS                             Lab Results  Component Value Date   WBC 15.0 (H) 08/18/2017   HGB 8.9 (L) 08/18/2017   HCT 27.0 (L) 08/18/2017   MCV 92.8 08/18/2017   PLT 365 08/18/2017   Lab Results  Component Value Date   CREATININE 1.31 (H) 08/18/2017   BUN 41 (H) 08/18/2017   NA 133 (L) 08/18/2017   K 4.2 08/18/2017   CL 98 (L) 08/18/2017   CO2 27 08/18/2017    Lab Results  Component Value Date   INR 0.92 08/17/2017   INR 0.89 12/09/2016   INR 0.96 07/12/2009    Anesthesia Physical Anesthesia Plan  ASA: IV  Anesthesia Plan: General   Post-op Pain Management:    Induction:   PONV Risk Score and Plan: 2 and Treatment may vary due to age or medical condition and Ondansetron  Airway Management Planned: Oral ETT and LMA  Additional Equipment:   Intra-op Plan:    Post-operative Plan: Extubation in OR  Informed Consent: I have reviewed the patients History and Physical, chart, labs and discussed the procedure including the risks, benefits and alternatives for the proposed anesthesia with the patient or authorized representative who has indicated his/her understanding and acceptance.   Dental advisory given  Plan Discussed with: CRNA and Anesthesiologist  Anesthesia Plan Comments:         Anesthesia Quick Evaluation

## 2017-08-18 NOTE — Progress Notes (Signed)
Orthopedic Tech Progress Note Patient Details:  Kristina [name] 1926-01-24 [id number]  Ortho Devices Ortho Device/Splint Location: Trapeze bar Ortho Device/Splint Interventions: Application   Post Interventions Patient Tolerated: Well Instructions Provided: Care of device, Adjustment of device   [place] Pink 08/18/2017, 8:10 PM

## 2017-08-18 NOTE — Anesthesia Postprocedure Evaluation (Signed)
Anesthesia Post Note  Patient: Kristina [name]  Procedure(s) Performed: INTRAMEDULLARY (IM) NAIL INTERTROCHANTRIC (Left Shoulder)     Patient location during evaluation: PACU Anesthesia Type: General Level of consciousness: awake and alert Pain management: pain level controlled Vital Signs Assessment: post-procedure vital signs reviewed and stable Respiratory status: spontaneous breathing, nonlabored ventilation, respiratory function stable and patient connected to nasal cannula oxygen Cardiovascular status: blood pressure returned to baseline and stable Postop Assessment: no apparent nausea or vomiting Anesthetic complications: no    Last Vitals:  Vitals:   08/18/17 1628 08/18/17 1643  BP: (!) 134/53   Pulse: 80 73  Resp: 19 16  Temp:    SpO2: 100% 97%    Last Pain:  Vitals:   08/18/17 1613  TempSrc:   PainSc: Asleep    LLE Motor Response: Purposeful movement;Responds to commands (08/18/17 1643) LLE Sensation: Full sensation (08/18/17 1643)          Saje Gallop S

## 2017-08-18 NOTE — Op Note (Signed)
   Date of Surgery: 08/18/2017  INDICATIONS: Ms. Jaquith is a 84 y.o.-year-old female who sustained a left hip fracture. The risks and benefits of the procedure discussed with the patient prior to the procedure and all questions were answered; consent was obtained.  PREOPERATIVE DIAGNOSIS: left intertrochanteric hip fracture   POSTOPERATIVE DIAGNOSIS: Same   PROCEDURE: Treatment of intertrochanteric fracture with intramedullary implant. CPT [phone]   SURGEON: N. Eduard Roux, M.D.   ASSIST: Ciro Backer [place], [place]; necessary for the timely completion of procedure and due to complexity of procedure.  ANESTHESIA: general   IV FLUIDS AND URINE: See anesthesia record   ESTIMATED BLOOD LOSS: 200 cc  IMPLANTS: [employer] InterTAN 10 x 36, 85/80  DRAINS: None.   COMPLICATIONS: None.   DESCRIPTION OF PROCEDURE: The patient was brought to the operating room and placed supine on the operating table. The patient's leg had been signed prior to the procedure. The patient had the anesthesia placed by the anesthesiologist. The prep verification and incision time-outs were performed to confirm that this was the correct patient, site, side and location. The patient had an SCD on the opposite lower extremity. The patient did receive antibiotics prior to the incision and was re-dosed during the procedure as needed at indicated intervals. The patient was positioned on the fracture table with the table in traction and internal rotation to reduce the hip. The well leg was placed in a scissor position and all bony prominences were well-padded. The patient had the lower extremity prepped and draped in the standard surgical fashion. The incision was made 4 finger breadths superior to the greater trochanter. A guide pin was inserted into the tip of the greater trochanter under fluoroscopic guidance. An opening reamer was used to gain access to the femoral canal. The nail length was measured and inserted down  the femoral canal to its proper depth. The appropriate version of insertion for the lag screw was found under fluoroscopy. A pin was inserted up the femoral neck through the jig. Then, a second antirotation pin was inserted inferior to the first pin. The length of the lag screw was then measured. The lag screw was inserted as near to center-center in the head as possible. The antirotation pin was then taken out and an interdigitating compression screw was placed in its place. The leg was taken out of traction, then the interdigitating compression screw was used to compress across the fracture. Compression was visualized on serial xrays. The wound was copiously irrigated with saline and the subcutaneous layer closed with 2.0 vicryl and the skin was reapproximated with staples. The wounds were cleaned and dried a final time and a sterile dressing was placed. The hip was taken through a range of motion at the end of the case under fluoroscopic imaging to visualize the approach-withdraw phenomenon and confirm implant length in the head. The patient was then awakened from anesthesia and taken to the recovery room in stable condition. All counts were correct at the end of the case.   POSTOPERATIVE PLAN: The patient will be weight bearing as tolerated and will return in 2 weeks for staple removal and the patient will receive DVT prophylaxis based on other medications, activity level, and risk ratio of bleeding to thrombosis.   Azucena Cecil, MD [place] 3:34 PM

## 2017-08-18 NOTE — Anesthesia Procedure Notes (Signed)
Procedure Name: Intubation Date/Time: 08/18/2017 2:38 PM Performed by: Julieta Bellini, CRNA Pre-anesthesia Checklist: Emergency Drugs available, Patient identified, Suction available and Patient being monitored Patient Re-evaluated:Patient Re-evaluated prior to induction Oxygen Delivery Method: Circle system utilized Preoxygenation: Pre-oxygenation with 100% oxygen Induction Type: IV induction Ventilation: Mask ventilation without difficulty LMA: LMA inserted LMA Size: 3.0 Number of attempts: 1 Placement Confirmation: positive ETCO2 and breath sounds checked- equal and bilateral Tube secured with: Tape Dental Injury: Teeth and Oropharynx as per pre-operative assessment

## 2017-08-19 DIAGNOSIS — N183 Chronic kidney disease, stage 3 (moderate): Secondary | ICD-10-CM

## 2017-08-19 DIAGNOSIS — E871 Hypo-osmolality and hyponatremia: Secondary | ICD-10-CM

## 2017-08-19 DIAGNOSIS — S72002A Fracture of unspecified part of neck of left femur, initial encounter for closed fracture: Secondary | ICD-10-CM

## 2017-08-19 DIAGNOSIS — I10 Essential (primary) hypertension: Secondary | ICD-10-CM

## 2017-08-19 DIAGNOSIS — Z419 Encounter for procedure for purposes other than remedying health state, unspecified: Secondary | ICD-10-CM

## 2017-08-19 DIAGNOSIS — K5641 Fecal impaction: Secondary | ICD-10-CM

## 2017-08-19 LAB — CBC WITH DIFFERENTIAL/PLATELET
BASOS PCT: 0 %
Basophils Absolute: 0 10*3/uL (ref 0.0–0.1)
EOS ABS: 0 10*3/uL (ref 0.0–0.7)
Eosinophils Relative: 0 %
HCT: 23.3 % — ABNORMAL LOW (ref 36.0–46.0)
Hemoglobin: 7.4 g/dL — ABNORMAL LOW (ref 12.0–15.0)
Lymphocytes Relative: 9 %
Lymphs Abs: 1.1 10*3/uL (ref 0.7–4.0)
MCH: 30 pg (ref 26.0–34.0)
MCHC: 31.8 g/dL (ref 30.0–36.0)
MCV: 94.3 fL (ref 78.0–100.0)
Monocytes Absolute: 1.1 10*3/uL — ABNORMAL HIGH (ref 0.1–1.0)
Monocytes Relative: 9 %
Neutro Abs: 10.1 10*3/uL — ABNORMAL HIGH (ref 1.7–7.7)
Neutrophils Relative %: 82 %
Platelets: 350 10*3/uL (ref 150–400)
RBC: 2.47 MIL/uL — AB (ref 3.87–5.11)
RDW: 13.6 % (ref 11.5–15.5)
WBC: 12.3 10*3/uL — AB (ref 4.0–10.5)

## 2017-08-19 LAB — BASIC METABOLIC PANEL
Anion gap: 13 (ref 5–15)
BUN: 45 mg/dL — ABNORMAL HIGH (ref 6–20)
CHLORIDE: 99 mmol/L — AB (ref 101–111)
CO2: 21 mmol/L — ABNORMAL LOW (ref 22–32)
Calcium: 8.4 mg/dL — ABNORMAL LOW (ref 8.9–10.3)
Creatinine, Ser: 1.57 mg/dL — ABNORMAL HIGH (ref 0.44–1.00)
GFR calc non Af Amer: 28 mL/min — ABNORMAL LOW (ref >60)
GFR, EST AFRICAN AMERICAN: 33 mL/min — AB (ref >60)
Glucose, Bld: 126 mg/dL — ABNORMAL HIGH (ref 65–99)
POTASSIUM: 3.7 mmol/L (ref 3.5–5.1)
Sodium: 133 mmol/L — ABNORMAL LOW (ref 135–145)

## 2017-08-19 LAB — HEMOGLOBIN AND HEMATOCRIT, BLOOD
HCT: 26.8 % — ABNORMAL LOW (ref 36.0–46.0)
Hemoglobin: 8.8 g/dL — ABNORMAL LOW (ref 12.0–15.0)

## 2017-08-19 LAB — PREPARE RBC (CROSSMATCH)

## 2017-08-19 MED ORDER — PANTOPRAZOLE SODIUM 40 MG PO TBEC
40.0000 mg | DELAYED_RELEASE_TABLET | Freq: Every day | ORAL | Status: DC
  Administered 2017-08-20 – 2017-08-21 (×2): 40 mg via ORAL
  Filled 2017-08-19 (×2): qty 1

## 2017-08-19 MED ORDER — CALCIUM CARBONATE ANTACID 500 MG PO CHEW
1.0000 | CHEWABLE_TABLET | Freq: Every day | ORAL | Status: DC
  Administered 2017-08-19 – 2017-08-21 (×3): 200 mg via ORAL
  Filled 2017-08-19 (×3): qty 1

## 2017-08-19 MED ORDER — SENNOSIDES-DOCUSATE SODIUM 8.6-50 MG PO TABS
1.0000 | ORAL_TABLET | Freq: Two times a day (BID) | ORAL | Status: DC
  Administered 2017-08-20 – 2017-08-21 (×2): 1 via ORAL
  Filled 2017-08-19 (×2): qty 1

## 2017-08-19 MED ORDER — ENOXAPARIN SODIUM 30 MG/0.3ML ~~LOC~~ SOLN
30.0000 mg | SUBCUTANEOUS | Status: DC
  Administered 2017-08-19 – 2017-08-20 (×2): 30 mg via SUBCUTANEOUS
  Filled 2017-08-19 (×2): qty 0.3

## 2017-08-19 MED ORDER — BISACODYL 10 MG RE SUPP
10.0000 mg | Freq: Once | RECTAL | Status: DC
  Filled 2017-08-19: qty 1

## 2017-08-19 MED ORDER — SODIUM CHLORIDE 0.9 % IV SOLN
Freq: Once | INTRAVENOUS | Status: AC
  Administered 2017-08-19: 12:00:00 via INTRAVENOUS

## 2017-08-19 NOTE — Progress Notes (Signed)
Bladder scanned patient, greater than 847mL per bladder scanner. Attempted to in and out cath 6 different times with a different nurse each attempt with no success. The in and out cath will go in but loops right back out. MD, Dr. Alfredia Ferguson was notified/aware of situation. Stated he was consulting with a urologist. Will continue to monitor patient, until given further instructions.

## 2017-08-19 NOTE — Progress Notes (Signed)
OT Cancellation Note  Patient Details Name: Kristina [name] MRN: [id number] DOB: Apr 22, 1926   Cancelled Treatment:    Reason Eval/Treat Not Completed: Medical issues which prohibited therapy. Pt nauseous (low BP readings this morning).  Family requesting therapy check back at a later time.    Darrol Jump OTR/L 08/19/2017, 9:47 AM

## 2017-08-19 NOTE — Progress Notes (Signed)
PT Cancellation Note  Patient Details Name: Kristina [name] MRN: [id number] DOB: [birth year]   Cancelled Treatment:     Therapy attempted to see the patient 2x. The first time in the morning the family declined 2nd to nausea. She was receiving blood in the afternoon. Therapy will follow up on 08/20/2017.    Carney Living  PT DPT  08/19/2017, 2:34 PM

## 2017-08-19 NOTE — Progress Notes (Signed)
   Subjective:  Patient reports pain as mild.  Slightly confused this morning. NAD  Objective:   VITALS:   Vitals:   08/18/17 2024 08/19/17 0003 08/19/17 0325 08/19/17 0610  BP:  (!) 90/41 (!) 97/49 (!) 105/58  Pulse:  71 72 70  Resp:      Temp: (!) 97.2 F (36.2 C) 98.5 F (36.9 C) 98.5 F (36.9 C)   TempSrc: Axillary Oral Oral   SpO2:  96% 92%   Weight:      Height:        Neurovascular intact Sensation intact distally Intact pulses distally Dorsiflexion/Plantar flexion intact Incision: dressing C/D/I and no drainage No cellulitis present Compartment soft   Lab Results  Component Value Date   WBC 12.3 (H) 08/19/2017   HGB 7.4 (L) 08/19/2017   HCT 23.3 (L) 08/19/2017   MCV 94.3 08/19/2017   PLT 350 08/19/2017     Assessment/Plan:  1 Day Post-Op   - Expected postop acute blood loss anemia - consider transfusing 1 unit RBCs - Up with PT/OT - DVT ppx - SCDs, ambulation, lovenox - WBAT operative extremity - Pain control - Discharge planning  Eduard Roux 08/19/2017, 9:18 AM [phone]

## 2017-08-19 NOTE — Progress Notes (Signed)
Waiting for urology to consult with patient. Dr. Alfredia Ferguson has contacted the family to update them on situation. Family understanding and at patients beside. Will continue to monitor. Patient resting comfortably.

## 2017-08-19 NOTE — Progress Notes (Signed)
PROGRESS NOTE    HARBOUR NORDMEYER  PYP:[id number] DOB: 01-03-1926 DOA: 08/17/2017 PCP: [name], MD   Brief Narrative:  84 y.o.femalewith past medical history relevant for hypertension, chronic anemia and CKD stage III who presents to the ED with complaints of left hip fall after falling out of bed overnight.Transferred to [facility] and underwent Left Hip Intramedullary Implant and is POD1. Hospitalization has been complicated by Acute Blood Loss Anemia, Constipation, and Acute Urinary Retention.  Assessment & Plan:   Principal Problem:   Lt Hip fracture ([place]) Active Problems:   Essential hypertension   Chronic kidney disease (CKD), stage III (moderate) (HCC)   Hyponatremia   Fecal impaction in rectum (HCC)    Left hip fracture s/p Intramedullary Implant by Dr.Xu POD1 -Will order PT eval and social worker evaluation for possible snf placement.   -Continue slow IV hydration. -Pain Control and VTE Prophylaxis per Ortho  Fecal impaction/ constipation -continue with laxatives and stool softeners and if no BM will need possible manual disimpaction by the nursing staff. -Added Biascodyl Suppository -may Try Enema  Acute Blood Loss in the setting of Anemia of chronic kidney disease  -Blood Count dropped to 7.4/23.3 -Will transfuse 1 unit -Continue to Monitor for S/Sx of Bleeding -Repeat CBC in AM   History of Hypertension  -restart hydralazine 50 mg twice a day when she is able to take p.o.  We will then continue IV hydralazine.  Leukocytosis rule out any infection.   -CXR Negative -Likely Reactive from Pain/Surgery -Continue to monitor for S/Sx of Infection -Repeat CBC in AM  AKI on CKD Stage 3 -Likely from Hypovolemia from Surgery -Transfusing 1 unit of Blood -? If urinary Retention causing worsening; Replace Foley by Urology -C/w IVF  -Repeat CMP in AM  Acute Urinary Retention -Urology consulted as nursing could not place foley -Check U/A and Urine  Cx -C/w Foley   DVT prophylaxis: Lovenox per Orthto Code Status: FULL CODE Family Communication: Discussed with Daughter at bedside Disposition Plan: Likely SNF  Consultants:   Orthopedic Surgery  Urology   Procedures:   PROCEDURE: Treatment of intertrochanteric fracture with intramedullary implant. CPT [phone]   Antimicrobials:  Anti-infectives (From admission, onward)   Start     Dose/Rate Route Frequency Ordered Stop   08/19/17 0600  vancomycin (VANCOCIN) IVPB 1000 mg/200 mL premix  Status:  Discontinued     1,000 mg 200 mL/hr over 60 Minutes Intravenous On call to O.R. 08/18/17 1316 08/18/17 1345   08/18/17 2045  ceFAZolin (ANCEF) IVPB 2g/100 mL premix     2 g 200 mL/hr over 30 Minutes Intravenous Every 6 hours 08/18/17 1735 08/19/17 0906   08/18/17 1345  ceFAZolin (ANCEF) IVPB 2g/100 mL premix     2 g 200 mL/hr over 30 Minutes Intravenous On call to O.R. 08/18/17 1316 08/18/17 1455   08/18/17 1339  ceFAZolin (ANCEF) 2-4 GM/100ML-% IVPB    Comments:  Schonewitz, Leigh   : cabinet override      08/18/17 1339 08/18/17 1445     Subjective: Seen and examined and was withdrawn and would not speak to me. Daughter states she has been "grumpy" since after surgery. No complaints but complained of abdominal discomfort  Objective: Vitals:   08/19/17 1224 08/19/17 1243 08/19/17 1505 08/19/17 2028  BP: (!) 133/59 (!) 128/53 (!) 112/58 (!) 119/51  Pulse: 83 79 79 98  Resp: 17 17 18    Temp: 98.4 F (36.9 C) 98.1 F (36.7 C) 98.5 F (36.9 C)  98.8 F (37.1 C)  TempSrc: Oral Oral Oral Oral  SpO2: 100% 100% 98% 94%  Weight:      Height:        Intake/Output Summary (Last 24 hours) at 08/19/2017 2100 Last data filed at 08/19/2017 1846 Gross per 24 hour  Intake 5022.91 ml  Output -  Net 5022.91 ml   Filed Weights   08/18/17 1325  Weight: 36.3 kg (80 lb)   Examination: Physical Exam:  Constitutional: Thin elderly frail Caucasian female in NAD and appears withdrawn and  slightly uncomfortable  Eyes: Lids and conjunctivae normal, sclerae anicteric  ENMT: External Ears, Nose appear normal. Grossly normal hearing. Mucous membranes are moist. .  Neck: Appears normal, supple, no cervical masses, normal ROM, no appreciable thyromegaly, no JVD Respiratory: Diminished to auscultation bilaterally, no wheezing, rales, rhonchi or crackles. Normal respiratory effort and patient is not tachypenic. No accessory muscle use.  Cardiovascular: RRR, no murmurs / rubs / gallops. S1 and S2 auscultated.  Abdomen: Soft, Tender to palapate especially lower abodmen, non-distended. No masses palpated. No appreciable hepatosplenomegaly. Bowel sounds positive x4.  GU: Deferred. Musculoskeletal: No clubbing / cyanosis of digits/nails. No joint deformity upper and lower extremities..  Skin: No rashes, lesions, ulcers on a limited skin eval. No induration; Warm and dry.  Neurologic: CN 2-12 appear grossly intact with no focal deficits. Romberg sign cerebellar reflexes not assessed.  Psychiatric: Impaired judgment and insight. Alert and awake. Withdrawn mood and appropriate affect.   Data Reviewed: I have personally reviewed following labs and imaging studies  CBC: Recent Labs  Lab 08/17/17 1319 08/18/17 0547 08/18/17 2017 08/19/17 0538 08/19/17 1546  WBC 14.0* 15.0* 17.8* 12.3*  --   NEUTROABS 13.0*  --   --  10.1*  --   HGB 9.9* 8.9* 8.8* 7.4* 8.8*  HCT 29.6* 27.0* 27.5* 23.3* 26.8*  MCV 91.4 92.8 94.2 94.3  --   PLT 444* 365 385 350  --    Basic Metabolic Panel: Recent Labs  Lab 08/17/17 1319 08/18/17 0547 08/18/17 2017 08/19/17 0538  NA 132* 133*  --  133*  K 4.0 4.2  --  3.7  CL 94* 98*  --  99*  CO2 28 27  --  21*  GLUCOSE 124* 105*  --  126*  BUN 35* 41*  --  45*  CREATININE 1.17* 1.31* 1.37* 1.57*  CALCIUM 9.5 9.1  --  8.4*   GFR: Estimated Creatinine Clearance: 13.9 mL/min (A) (by C-G formula based on SCr of 1.57 mg/dL (H)). Liver Function Tests: No  results for input(s): AST, ALT, ALKPHOS, BILITOT, PROT, ALBUMIN in the last 168 hours. No results for input(s): LIPASE, AMYLASE in the last 168 hours. No results for input(s): AMMONIA in the last 168 hours. Coagulation Profile: Recent Labs  Lab 08/17/17 1319  INR 0.92   Cardiac Enzymes: No results for input(s): CKTOTAL, CKMB, CKMBINDEX, TROPONINI in the last 168 hours. BNP (last 3 results) No results for input(s): PROBNP in the last 8760 hours. HbA1C: No results for input(s): HGBA1C in the last 72 hours. CBG: No results for input(s): GLUCAP in the last 168 hours. Lipid Profile: No results for input(s): CHOL, HDL, LDLCALC, TRIG, CHOLHDL, LDLDIRECT in the last 72 hours. Thyroid Function Tests: No results for input(s): TSH, T4TOTAL, FREET4, T3FREE, THYROIDAB in the last 72 hours. Anemia Panel: No results for input(s): VITAMINB12, FOLATE, FERRITIN, TIBC, IRON, RETICCTPCT in the last 72 hours. Sepsis Labs: No results for input(s): PROCALCITON, LATICACIDVEN in the last  168 hours.  Recent Results (from the past 240 hour(s))  Surgical PCR screen     Status: None   Collection Time: 08/17/17 11:10 PM  Result Value Ref Range Status   MRSA, PCR NEGATIVE NEGATIVE Final   Staphylococcus aureus NEGATIVE NEGATIVE Final    Comment: (NOTE) The Xpert SA Assay (FDA approved for NASAL specimens in patients 50 years of age and older), is one component of a comprehensive surveillance program. It is not intended to diagnose infection nor to guide or monitor treatment.     Radiology Studies: Dg C-arm 1-60 Min  Result Date: 08/18/2017 CLINICAL DATA:  Elective surgery.  Intramedullary rod. EXAM: LEFT FEMUR 2 VIEWS; DG C-ARM 61-120 MIN COMPARISON:  CT of the left hip 08/17/2017. FLUOROSCOPY TIME:  Fluoroscopy Time:  1 minute 43 seconds Number of Acquired Spot Images: 0 FINDINGS: Proximal and distal ends of the intramedullary rod are demonstrated without complication. Femoral neck screws are in place.  Fracture is reduced. IMPRESSION: ORIF of the left hip without radiographic evidence for complication. Electronically Signed   By: San Morelle M.D.   On: 08/18/2017 15:52   Dg Femur Min 2 Views Left  Result Date: 08/18/2017 CLINICAL DATA:  Elective surgery.  Intramedullary rod. EXAM: LEFT FEMUR 2 VIEWS; DG C-ARM 61-120 MIN COMPARISON:  CT of the left hip 08/17/2017. FLUOROSCOPY TIME:  Fluoroscopy Time:  1 minute 43 seconds Number of Acquired Spot Images: 0 FINDINGS: Proximal and distal ends of the intramedullary rod are demonstrated without complication. Femoral neck screws are in place. Fracture is reduced. IMPRESSION: ORIF of the left hip without radiographic evidence for complication. Electronically Signed   By: San Morelle M.D.   On: 08/18/2017 15:52   Scheduled Meds: . acidophilus  1 capsule Oral QHS  . bisacodyl  10 mg Rectal Once  . calcium carbonate  1 tablet Oral Daily  . enoxaparin (LOVENOX) injection  30 mg Subcutaneous Q24H  . feeding supplement (ENSURE ENLIVE)  237 mL Oral BID BM  . hydrALAZINE  50 mg Oral BID  . pantoprazole  40 mg Oral Daily  . polyethylene glycol  17 g Oral BID  . senna-docusate  1 tablet Oral BID  . sodium chloride flush  3 mL Intravenous Q12H   Continuous Infusions: . sodium chloride    . sodium chloride 50 mL/hr at 08/17/17 1940  . sodium chloride 125 mL/hr at 08/18/17 1821  . lactated ringers    . methocarbamol (ROBAXIN)  IV      LOS: 2 days   Kerney Elbe, DO [facility] Pager [phone]  If 7PM-7AM, please contact night-coverage [email or web address] Password [email or web address] 08/19/2017, 9:00 PM

## 2017-08-19 NOTE — Consult Note (Signed)
Consult: urinary retention Requested by: Dr. William Dalton  History of Present Illness: 84 yo female POD#1 Treatment of intertrochanteric fracture with intramedullary implant. She has not been ambulating nor been able to work with PT/OT. Foley was removed and pt has not voided. Bladder scan ~ 800 ml. Nurses tried to placed foley.     Past Medical History:  Diagnosis Date  . AAA (abdominal aortic aneurysm) (HCC)    Mild - measuring 2.8x2.8cm  . Acute hyponatremia 11/2016  . 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 HOH (hard of hearing)   . Hyperlipidemia    Well-controlled  . Hypertension   . PAD (peripheral artery disease) (HCC)    LEA Dopplers: Right external iliac 40%, right mid SFA 70-99% with occlusion in Hunter's canal. Angiography. LEFT common iliac 60%, left SFA 70-9%, popliteal 50%; RIGHT posterior tibial occluded;; carotid Dopplers in 2009: Less than 50% stenosis.  . Renal artery stenosis [facility])    Past Surgical History:  Procedure Laterality Date  . LOWER EXTREMITY ARTERIAL DOPPLER  05/30/2011   Right EIA->50% daimeter reduction, Rt SFA 70-99% diameter reduction, Rt SFA-occlusive disease at Hunter's canal, Rt PTA-appeared occluded, Lft CIA-moderate amount of calcific plaque w/ >60% diameter reduction, Lft prox SFA 70-99% diameter reduction, Lft POP-large amount of irregular mixed suggesting <50% diameter reduction  . NM MYOVIEW LTD  06/25/2008   No scintigraphic evidence of inducible myocardial ischemia  . PERIPHERAL VASCULAR ANGIOGRAM  07/13/2009   No intervention - "plan on doing staged intervention in near future"  . TRANSTHORACIC ECHOCARDIOGRAM  11/14/2007   EF >55%, mild mitral and tricuspid regurg.    Home Medications:  Medications Prior to Admission  Medication Sig Dispense Refill Last Dose  . aspirin 81 MG tablet Take 81 mg by mouth daily.   08/16/2017 at Unknown time  . Coenzyme Q10  (COQ10) 100 MG CAPS Take 100 mg by mouth 3 (three) times daily with meals. 90 each 3 08/16/2017 at Unknown time  . Docusate Sodium (COLACE PO) Take 1 tablet by mouth daily.   08/16/2017 at Unknown time  . hydrALAZINE (APRESOLINE) 50 MG tablet Take 50 mg by mouth 2 (two) times daily.   08/16/2017 at Unknown time  . polyethylene glycol (MIRALAX / GLYCOLAX) packet Take 17 g by mouth daily.   08/16/2017 at Unknown time  . Probiotic Product (ALIGN) 4 MG CAPS Take 4 mg by mouth at bedtime.    08/16/2017 at Unknown time   Allergies:  Allergies  Allergen Reactions  . Lipitor [Atorvastatin] Other (See Comments)    Reaction:  Unknown   . Pletal [Cilostazol] Other (See Comments)    Reaction:  Unknown   . Simvastatin     MYALGIAS    Family History  Problem Relation Age of Onset  . Heart attack Brother   . Cancer Brother        Bone cancer   Social History:  reports that she quit smoking about 16 years ago. Her smoking use included cigarettes. she has never used smokeless tobacco. She reports that she drinks about 0.6 oz of alcohol per week. She reports that she does not use drugs.  ROS: A complete review of systems was performed.  All systems are negative except for pertinent findings as noted. Review of Systems  Unable to perform ROS: Mental acuity     Physical Exam:  Vital signs in last 24 hours:  Temp:  [97.2 F (36.2 C)-98.6 F (37 C)] 98.5 F (36.9 C) (01/19 1505) Pulse Rate:  [50-83] 79 (01/19 1505) Resp:  [17-18] 18 (01/19 1505) BP: (90-133)/(41-63) 112/58 (01/19 1505) SpO2:  [92 %-100 %] 98 % (01/19 1505) General:  Alert and oriented, No acute distress HEENT: Normocephalic, atraumatic Cardiovascular: Regular rate and rhythm Lungs: Regular rate and effort Abdomen: Soft, nontender, nondistended, no abdominal masses, bladder mildly distended.  Back: No CVA tenderness Neurologic: Grossly intact  Discussed with daughter and daughter in law the nature r/b/a to foley catheter  placement and they elected to proceed. [place] and another nurse assisted. Vagina/meatus prepped and a finger with sterile glove inserted in the vagina. Meatus palpable anteriorly. 16Fr foley guided in without difficulty. About 500 ml clear urine drained.   Laboratory Data:  Results for orders placed or performed during the hospital encounter of 08/17/17 (from the past 24 hour(s))  CBC     Status: Abnormal   Collection Time: 08/18/17  8:17 PM  Result Value Ref Range   WBC 17.8 (H) 4.0 - 10.5 K/uL   RBC 2.92 (L) 3.87 - 5.11 MIL/uL   Hemoglobin 8.8 (L) 12.0 - 15.0 g/dL   HCT 27.5 (L) 36.0 - 46.0 %   MCV 94.2 78.0 - 100.0 fL   MCH 30.1 26.0 - 34.0 pg   MCHC 32.0 30.0 - 36.0 g/dL   RDW 13.6 11.5 - 15.5 %   Platelets 385 150 - 400 K/uL  Creatinine, serum     Status: Abnormal   Collection Time: 08/18/17  8:17 PM  Result Value Ref Range   Creatinine, Ser 1.37 (H) 0.44 - 1.00 mg/dL   GFR calc non Af Amer 33 (L) >60 mL/min   GFR calc Af Amer 38 (L) >60 mL/min  CBC with Differential/Platelet     Status: Abnormal   Collection Time: 08/19/17  5:38 AM  Result Value Ref Range   WBC 12.3 (H) 4.0 - 10.5 K/uL   RBC 2.47 (L) 3.87 - 5.11 MIL/uL   Hemoglobin 7.4 (L) 12.0 - 15.0 g/dL   HCT 23.3 (L) 36.0 - 46.0 %   MCV 94.3 78.0 - 100.0 fL   MCH 30.0 26.0 - 34.0 pg   MCHC 31.8 30.0 - 36.0 g/dL   RDW 13.6 11.5 - 15.5 %   Platelets 350 150 - 400 K/uL   Neutrophils Relative % 82 %   Neutro Abs 10.1 (H) 1.7 - 7.7 K/uL   Lymphocytes Relative 9 %   Lymphs Abs 1.1 0.7 - 4.0 K/uL   Monocytes Relative 9 %   Monocytes Absolute 1.1 (H) 0.1 - 1.0 K/uL   Eosinophils Relative 0 %   Eosinophils Absolute 0.0 0.0 - 0.7 K/uL   Basophils Relative 0 %   Basophils Absolute 0.0 0.0 - 0.1 K/uL  Basic metabolic panel     Status: Abnormal   Collection Time: 08/19/17  5:38 AM  Result Value Ref Range   Sodium 133 (L) 135 - 145 mmol/L   Potassium 3.7 3.5 - 5.1 mmol/L   Chloride 99 (L) 101 - 111 mmol/L   CO2 21 (L)  22 - 32 mmol/L   Glucose, Bld 126 (H) 65 - 99 mg/dL   BUN 45 (H) 6 - 20 mg/dL   Creatinine, Ser 1.57 (H) 0.44 - 1.00 mg/dL   Calcium 8.4 (L) 8.9 - 10.3 mg/dL   GFR calc non Af Amer 28 (L) >60 mL/min   GFR calc Af Amer 33 (L) >60  mL/min   Anion gap 13 5 - 15  Prepare RBC     Status: None   Collection Time: 08/19/17 10:55 AM  Result Value Ref Range   Order Confirmation ORDER PROCESSED BY BLOOD BANK   Hemoglobin and hematocrit, blood     Status: Abnormal   Collection Time: 08/19/17  3:46 PM  Result Value Ref Range   Hemoglobin 8.8 (L) 12.0 - 15.0 g/dL   HCT 26.8 (L) 36.0 - 46.0 %   Recent Results (from the past 240 hour(s))  Surgical PCR screen     Status: None   Collection Time: 08/17/17 11:10 PM  Result Value Ref Range Status   MRSA, PCR NEGATIVE NEGATIVE Final   Staphylococcus aureus NEGATIVE NEGATIVE Final    Comment: (NOTE) The Xpert SA Assay (FDA approved for NASAL specimens in patients 69 years of age and older), is one component of a comprehensive surveillance program. It is not intended to diagnose infection nor to guide or monitor treatment.    Creatinine: Recent Labs    08/17/17 1319 08/18/17 0547 08/18/17 2017 08/19/17 0538  CREATININE 1.17* 1.31* 1.37* 1.57*    Impression/Assessment/plan: Urinary retention - continue foley until patient lucid and ambulatory. Will sign off. Call with any questions or concerns.   Festus Aloe 08/19/2017, 6:16 PM

## 2017-08-20 DIAGNOSIS — S72002D Fracture of unspecified part of neck of left femur, subsequent encounter for closed fracture with routine healing: Secondary | ICD-10-CM

## 2017-08-20 LAB — URINALYSIS, ROUTINE W REFLEX MICROSCOPIC
BILIRUBIN URINE: NEGATIVE
Glucose, UA: NEGATIVE mg/dL
KETONES UR: NEGATIVE mg/dL
LEUKOCYTES UA: NEGATIVE
Nitrite: NEGATIVE
PH: 5 (ref 5.0–8.0)
PROTEIN: NEGATIVE mg/dL
Specific Gravity, Urine: 1.021 (ref 1.005–1.030)

## 2017-08-20 LAB — BPAM RBC
Blood Product Expiration Date: 201902042359
ISSUE DATE / TIME: 201901191212
Unit Type and Rh: 6200

## 2017-08-20 LAB — PHOSPHORUS: Phosphorus: 2.2 mg/dL — ABNORMAL LOW (ref 2.5–4.6)

## 2017-08-20 LAB — CBC WITH DIFFERENTIAL/PLATELET
BASOS ABS: 0 10*3/uL (ref 0.0–0.1)
BASOS PCT: 0 %
EOS ABS: 0.1 10*3/uL (ref 0.0–0.7)
Eosinophils Relative: 1 %
HCT: 28.9 % — ABNORMAL LOW (ref 36.0–46.0)
Hemoglobin: 9.1 g/dL — ABNORMAL LOW (ref 12.0–15.0)
Lymphocytes Relative: 10 %
Lymphs Abs: 1 10*3/uL (ref 0.7–4.0)
MCH: 30 pg (ref 26.0–34.0)
MCHC: 31.5 g/dL (ref 30.0–36.0)
MCV: 95.4 fL (ref 78.0–100.0)
MONO ABS: 0.8 10*3/uL (ref 0.1–1.0)
MONOS PCT: 8 %
Neutro Abs: 8 10*3/uL — ABNORMAL HIGH (ref 1.7–7.7)
Neutrophils Relative %: 81 %
Platelets: 338 10*3/uL (ref 150–400)
RBC: 3.03 MIL/uL — ABNORMAL LOW (ref 3.87–5.11)
RDW: 14.5 % (ref 11.5–15.5)
WBC: 9.8 10*3/uL (ref 4.0–10.5)

## 2017-08-20 LAB — COMPREHENSIVE METABOLIC PANEL
ALBUMIN: 2.8 g/dL — AB (ref 3.5–5.0)
ALT: <5 U/L — ABNORMAL LOW (ref 14–54)
ANION GAP: 10 (ref 5–15)
AST: 43 U/L — AB (ref 15–41)
Alkaline Phosphatase: 48 U/L (ref 38–126)
BUN: 44 mg/dL — ABNORMAL HIGH (ref 6–20)
CO2: 25 mmol/L (ref 22–32)
Calcium: 9 mg/dL (ref 8.9–10.3)
Chloride: 101 mmol/L (ref 101–111)
Creatinine, Ser: 1.38 mg/dL — ABNORMAL HIGH (ref 0.44–1.00)
GFR calc Af Amer: 38 mL/min — ABNORMAL LOW (ref >60)
GFR calc non Af Amer: 33 mL/min — ABNORMAL LOW (ref >60)
GLUCOSE: 161 mg/dL — AB (ref 65–99)
POTASSIUM: 4 mmol/L (ref 3.5–5.1)
SODIUM: 136 mmol/L (ref 135–145)
Total Bilirubin: 0.7 mg/dL (ref 0.3–1.2)
Total Protein: 5.8 g/dL — ABNORMAL LOW (ref 6.5–8.1)

## 2017-08-20 LAB — TYPE AND SCREEN
ABO/RH(D): A POS
ANTIBODY SCREEN: NEGATIVE
UNIT DIVISION: 0

## 2017-08-20 LAB — URINE CULTURE: CULTURE: NO GROWTH

## 2017-08-20 LAB — MAGNESIUM: Magnesium: 2 mg/dL (ref 1.7–2.4)

## 2017-08-20 MED ORDER — SODIUM CHLORIDE 0.9 % IV SOLN
INTRAVENOUS | Status: AC
  Administered 2017-08-20: 18:00:00 via INTRAVENOUS

## 2017-08-20 MED ORDER — K PHOS MONO-SOD PHOS DI & MONO 155-852-130 MG PO TABS
500.0000 mg | ORAL_TABLET | Freq: Two times a day (BID) | ORAL | Status: AC
  Administered 2017-08-20 (×2): 500 mg via ORAL
  Filled 2017-08-20 (×2): qty 2

## 2017-08-20 NOTE — Progress Notes (Signed)
PROGRESS NOTE    Kristina [name]  XBJ:[id number] DOB: 01-21-1926 DOA: 08/17/2017 PCP: [name], MD   Brief Narrative:  84 y.o.femalewith past medical history relevant for hypertension, chronic anemia and CKD stage III who presents to the ED with complaints of left hip fall after falling out of bed overnight.Transferred to [facility] and underwent Left Hip Intramedullary Implant and is POD2. Hospitalization has been complicated by Acute Blood Loss Anemia, Constipation, and Acute Urinary Retention. She is improved and PT/OT evaluated and recommending SNF.  Assessment & Plan:   Principal Problem:   Lt Hip fracture ([place]) Active Problems:   Essential hypertension   Chronic kidney disease (CKD), stage III (moderate) (HCC)   Hyponatremia   Fecal impaction in rectum (HCC)  Left Hip Fracture s/p Intramedullary Implant by Dr. [name] POD2 -IVF Rehydration reduced to 50 mL/hr x 10 more hours  -Pain Control and VTE Prophylaxis per Ortho; C/w Bowel Regimen  -PT/OT recommending SNF -Social Work Consulted for SNF placement   Fecal Impaction/ Constipation -continue with laxatives and stool softeners and if no BM will need possible manual disimpaction by the nursing staff. -Added Biascodyl Suppository -may Try Enema  Acute Blood Loss in the setting of Anemia of Chronic Kidney Disease  -Blood Count dropped to 7.4/23.3;  S/p Transfusion of 1 unit of pRBC's and Hb/Hct improved to 9.1/28.9 -Continue to Monitor for S/Sx of Bleeding and transfuse as necessary  -Repeat CBC in AM   Hypertension and Hx of Renal Artery Stenosis  -C/w Hydralazine 50 mg po BID -C/w 10 mg IV q6hprn for SBP >170  Leukocytosis, improved   -CXR Negative -Likely Reactive from Pain/Surgery -WBC went from 17.8 -> 9.8 -Continue to monitor for S/Sx of Infection -Repeat CBC in AM  AKI on CKD Stage 3, improved  -Likely from Hypovolemia from Surgery -Transfusing 1 unit of Blood -? If urinary Retention causing  worsening; Replace Foley by Urology -C/w IVF but reduced rate to 50 mL/hr x 10 hours -BUN/Cr now 44/138 and stable.  -Repeat CMP in AM  Acute Urinary Retention, improved -Urology consulted as nursing could not place foley -Check U/A and showed Clear appearance, Small Hb, Neative Leukocytes, Negative Nitrites, Rare Bacteria, and 6-30 WBC and Urine Cx pending  -C/w Foley and appreciated Urology Input  Hypophosphatemia -Patient's Phos Level was 2.2  -Replete with K Phos Neutral 500 mg po BID x 2 doses -Continue to Monitor and Replete as Necessary -Repeat Phos Level in AM  GERD -C/w Pantoprazole 40 mg po Daily   DVT prophylaxis: Lovenox 30 mg sq q24h per [facility] Code Status: FULL CODE Family Communication: Discussed with Daughter at bedside Disposition Plan: SNF  Consultants:   Orthopedic Surgery  Urology   Procedures:   PROCEDURE: Treatment of intertrochanteric fracture with intramedullary implant. CPT [phone]   Antimicrobials:  Anti-infectives (From admission, onward)   Start     Dose/Rate Route Frequency Ordered Stop   08/19/17 0600  vancomycin (VANCOCIN) IVPB 1000 mg/200 mL premix  Status:  Discontinued     1,000 mg 200 mL/hr over 60 Minutes Intravenous On call to O.R. 08/18/17 1316 08/18/17 1345   08/18/17 2045  ceFAZolin (ANCEF) IVPB 2g/100 mL premix     2 g 200 mL/hr over 30 Minutes Intravenous Every 6 hours 08/18/17 1735 08/19/17 0906   08/18/17 1345  ceFAZolin (ANCEF) IVPB 2g/100 mL premix     2 g 200 mL/hr over 30 Minutes Intravenous On call to O.R. 08/18/17 1316 08/18/17 1455   08/18/17  1339  ceFAZolin (ANCEF) 2-4 GM/100ML-% IVPB    Comments:  Schonewitz, Leigh   : cabinet override      08/18/17 1339 08/18/17 1445     Subjective: Seen and examined and was more alert and awake and interactive and felt better. Denied any Pain. No other concerns and was sitting in chair bedside after getting up with PT.   Objective: Vitals:   08/19/17 1505 08/19/17 2028  08/20/17 0552 08/20/17 0817  BP: (!) 112/58 (!) 119/51 (!) 144/66   Pulse: 79 98 73 76  Resp: 18     Temp: 98.5 F (36.9 C) 98.8 F (37.1 C) 97.8 F (36.6 C)   TempSrc: Oral Oral Oral   SpO2: 98% 94% 93% 97%  Weight:      Height:        Intake/Output Summary (Last 24 hours) at 08/20/2017 1340 Last data filed at 08/20/2017 0818 Gross per 24 hour  Intake 4577.91 ml  Output 300 ml  Net 4277.91 ml   Filed Weights   08/18/17 1325  Weight: 36.3 kg (80 lb)   Examination: Physical Exam:  Constitutional: Thin elderly cachetic female in NAD sitting bedside in chair Eyes: Pupils miotic but symmetrical. Scleare anicteric. Lids normal ENMT: External Ears and nose appear normal. Neck: Supple with no JVD Respiratory: Slightly diminished but unlabored breathing. No appreciable wheezing/rales/rhonchi. Patient was not tachypenic or using any accessory muscles to breathe.  Cardiovascular: RRR; No appreciable m/r/g. No LE edema Abdomen: Soft, Mildly tender. No appreciable masses. Bowel sounds x4 GU: Deferred. Foley Catheter in placer Musculoskeletal: No contractures; no cyanosis Skin: Warm and Dry; No appreciable rashes or lesions on a limited skin eval Neurologic: CN 2-12 grossly intact; No appreciable focal deficits Psychiatric: Normal mood and affect. Intact judgement and insight  Data Reviewed: I have personally reviewed following labs and imaging studies  CBC: Recent Labs  Lab 08/17/17 1319 08/18/17 0547 08/18/17 2017 08/19/17 0538 08/19/17 1546 08/20/17 0936  WBC 14.0* 15.0* 17.8* 12.3*  --  9.8  NEUTROABS 13.0*  --   --  10.1*  --  8.0*  HGB 9.9* 8.9* 8.8* 7.4* 8.8* 9.1*  HCT 29.6* 27.0* 27.5* 23.3* 26.8* 28.9*  MCV 91.4 92.8 94.2 94.3  --  95.4  PLT 444* 365 385 350  --  443   Basic Metabolic Panel: Recent Labs  Lab 08/17/17 1319 08/18/17 0547 08/18/17 2017 08/19/17 0538 08/20/17 0936  NA 132* 133*  --  133* 136  K 4.0 4.2  --  3.7 4.0  CL 94* 98*  --  99* 101    CO2 28 27  --  21* 25  GLUCOSE 124* 105*  --  126* 161*  BUN 35* 41*  --  45* 44*  CREATININE 1.17* 1.31* 1.37* 1.57* 1.38*  CALCIUM 9.5 9.1  --  8.4* 9.0  MG  --   --   --   --  2.0  PHOS  --   --   --   --  2.2*   GFR: Estimated Creatinine Clearance: 15.8 mL/min (A) (by C-G formula based on SCr of 1.38 mg/dL (H)). Liver Function Tests: Recent Labs  Lab 08/20/17 0936  AST 43*  ALT <5*  ALKPHOS 48  BILITOT 0.7  PROT 5.8*  ALBUMIN 2.8*   No results for input(s): LIPASE, AMYLASE in the last 168 hours. No results for input(s): AMMONIA in the last 168 hours. Coagulation Profile: Recent Labs  Lab 08/17/17 1319  INR 0.92   Cardiac  Enzymes: No results for input(s): CKTOTAL, CKMB, CKMBINDEX, TROPONINI in the last 168 hours. BNP (last 3 results) No results for input(s): PROBNP in the last 8760 hours. HbA1C: No results for input(s): HGBA1C in the last 72 hours. CBG: No results for input(s): GLUCAP in the last 168 hours. Lipid Profile: No results for input(s): CHOL, HDL, LDLCALC, TRIG, CHOLHDL, LDLDIRECT in the last 72 hours. Thyroid Function Tests: No results for input(s): TSH, T4TOTAL, FREET4, T3FREE, THYROIDAB in the last 72 hours. Anemia Panel: No results for input(s): VITAMINB12, FOLATE, FERRITIN, TIBC, IRON, RETICCTPCT in the last 72 hours. Sepsis Labs: No results for input(s): PROCALCITON, LATICACIDVEN in the last 168 hours.  Recent Results (from the past 240 hour(s))  Surgical PCR screen     Status: None   Collection Time: 08/17/17 11:10 PM  Result Value Ref Range Status   MRSA, PCR NEGATIVE NEGATIVE Final   Staphylococcus aureus NEGATIVE NEGATIVE Final    Comment: (NOTE) The Xpert SA Assay (FDA approved for NASAL specimens in patients 71 years of age and older), is one component of a comprehensive surveillance program. It is not intended to diagnose infection nor to guide or monitor treatment.   Culture, Urine     Status: None   Collection Time: 08/18/17   2:48 PM  Result Value Ref Range Status   Specimen Description URINE, CLEAN CATCH  Final   Special Requests NONE  Final   Culture NO GROWTH  Final   Report Status 08/20/2017 FINAL  Final    Radiology Studies: Dg C-arm 1-60 Min  Result Date: 08/18/2017 CLINICAL DATA:  Elective surgery.  Intramedullary rod. EXAM: LEFT FEMUR 2 VIEWS; DG C-ARM 61-120 MIN COMPARISON:  CT of the left hip 08/17/2017. FLUOROSCOPY TIME:  Fluoroscopy Time:  1 minute 43 seconds Number of Acquired Spot Images: 0 FINDINGS: Proximal and distal ends of the intramedullary rod are demonstrated without complication. Femoral neck screws are in place. Fracture is reduced. IMPRESSION: ORIF of the left hip without radiographic evidence for complication. Electronically Signed   By: San Morelle M.D.   On: 08/18/2017 15:52   Dg Femur Min 2 Views Left  Result Date: 08/18/2017 CLINICAL DATA:  Elective surgery.  Intramedullary rod. EXAM: LEFT FEMUR 2 VIEWS; DG C-ARM 61-120 MIN COMPARISON:  CT of the left hip 08/17/2017. FLUOROSCOPY TIME:  Fluoroscopy Time:  1 minute 43 seconds Number of Acquired Spot Images: 0 FINDINGS: Proximal and distal ends of the intramedullary rod are demonstrated without complication. Femoral neck screws are in place. Fracture is reduced. IMPRESSION: ORIF of the left hip without radiographic evidence for complication. Electronically Signed   By: San Morelle M.D.   On: 08/18/2017 15:52   Scheduled Meds: . acidophilus  1 capsule Oral QHS  . bisacodyl  10 mg Rectal Once  . calcium carbonate  1 tablet Oral Daily  . enoxaparin (LOVENOX) injection  30 mg Subcutaneous Q24H  . feeding supplement (ENSURE ENLIVE)  237 mL Oral BID BM  . hydrALAZINE  50 mg Oral BID  . pantoprazole  40 mg Oral Daily  . polyethylene glycol  17 g Oral BID  . senna-docusate  1 tablet Oral BID  . sodium chloride flush  3 mL Intravenous Q12H   Continuous Infusions: . sodium chloride    . sodium chloride 50 mL/hr at 08/17/17  1940  . sodium chloride 125 mL/hr at 08/18/17 1821  . lactated ringers    . methocarbamol (ROBAXIN)  IV      LOS: 3 days  Kerney Elbe, DO [facility] Pager [phone]  If 7PM-7AM, please contact night-coverage [email or web address] Password [email or web address] 08/20/2017, 1:40 PM

## 2017-08-20 NOTE — Clinical Social Work Note (Signed)
Clinical Social Work Assessment  Patient Details  Name: Kristina [name] MRN: [id number] Date of Birth: August 11, 1925  Date of referral:  08/20/17               Reason for consult:  Facility Placement, [place] Directives                Permission sought to share information with:  Facility Sport and exercise psychologist, Family Supports Permission granted to share information::  Yes, Verbal Permission Granted  Name::     Jacqlyn Larsen  Agency::  SNF  Relationship::  Daughter, Daughter-in-Law  Contact Information:     Housing/Transportation Living arrangements for the past 2 months:  [place] of Information:  Patient, Adult Children Patient Interpreter Needed:  None Criminal Activity/Legal Involvement Pertinent to Current Situation/Hospitalization:  No - Comment as needed Significant Relationships:  Adult Children Lives with:  Self Do you feel safe going back to the place where you live?  Yes Need for family participation in patient care:  No (Coment)  Care giving concerns:  Patient lives home alone and will benefit from short term rehab at discharge to improve mobility and ability to care for self.   Social Worker assessment / plan:  CSW met with patient and patient's family at bedside to discuss recommendation for SNF at discharge. CSW answered questions and provided information to family. CSW provided facility list and encouraged patient's family to determine facility preferences. CSW to fax out referral and follow up with bed offers. CSW answered questions about healthcare power of attorney and provided advanced directives packet for daughter to fill out. CSW will continue to follow.  Employment status:  Retired Forensic scientist:  Medicare PT Recommendations:  [place] / Referral to community resources:  [place]  Patient/Family's Response to care:  Patient and family are agreeable to SNF  placement.  Patient/Family's Understanding of and Emotional Response to Diagnosis, Current Treatment, and Prognosis:  Patient and family appreciated CSW assistance and information provided. Patient's daughter discussed how she didn't know what she was doing because they never thought about having to do any of this because the patient has been so independent before. Patient's daughter appreciated information and said she would do her homework. Patient's daughter-in-law requested information about healthcare POA paperwork, as the patient's son is currently mentally unstable and should not be contacted to help with making decisions.   Emotional Assessment Appearance:  Appears stated age Attitude/Demeanor/Rapport:  Engaged Affect (typically observed):  Appropriate, Pleasant Orientation:  Oriented to Self, Oriented to Place, Oriented to  Time, Oriented to Situation Alcohol / Substance use:  Not Applicable Psych involvement (Current and /or in the community):  No (Comment)  Discharge Needs  Concerns to be addressed:  Care Coordination Readmission within the last 30 days:  No Current discharge risk:  Lives alone, Dependent with Mobility Barriers to Discharge:  Continued Medical Work up   [employer], [place] 08/20/2017, 4:10 PM

## 2017-08-20 NOTE — Progress Notes (Signed)
Physical Therapy Evaluation Patient Details Name: Kristina [name] MRN: [id number] DOB: 1925/12/12 Today's Date: 08/20/2017   History of Present Illness  84 yo female s/p L IM nail with foley placed and urology consult pending pmh: hypertension, chronic anemia and CKD stage III   Clinical Impression  Patient admitted with the above listed diagnosis. Patient in chair, with family present, upon PT arrival. Patient able to participate with PT for transfer training and weight shifting onto operative LE with good tolerance. Does require Mod A for sit to stand with heavy VC for hand placement prior to transfer for overall safety. Time spent in standing with patient tendency to weight shift away from operative LE and difficulty placing weight through L LE and B UE for R foot clearance form floor due to pain and general weakness. Patient and family anticipating d/c to SNF. PT to continue to follow acutely to maximize functional mobility prior to d/c.     Follow Up Recommendations SNF;Supervision for mobility/OOB    Equipment Recommendations  (will determine at next venue of care)    Recommendations for Other Services       Precautions / Restrictions Precautions Precautions: Fall Restrictions Weight Bearing Restrictions: No LLE Weight Bearing: Weight bearing as tolerated      Mobility  Bed Mobility Overal bed mobility: Needs Assistance Bed Mobility: Supine to Sit     Supine to sit: Max assist     General bed mobility comments: pt exiting to the R side with therapist using pad to pivot toward the EOB. pt reports "that wasnt bad at all"   Transfers Overall transfer level: Needs assistance Equipment used: Rolling walker (2 wheeled) Transfers: Sit to/from Stand Sit to Stand: Mod assist Stand pivot transfers: Max assist       General transfer comment: heavy VC for hand placement, narrow BOS  Ambulation/Gait             General Gait Details: gait training deferred today,  however able to perform weight shifting in standing with poor weight acceptance onto L LE  Stairs            Wheelchair Mobility    Modified Rankin (Stroke Patients Only)       Balance Overall balance assessment: Needs assistance Sitting-balance support: Bilateral upper extremity supported Sitting balance-Leahy Scale: Poor     Standing balance support: Bilateral upper extremity supported Standing balance-Leahy Scale: Poor                               Pertinent Vitals/Pain Pain Assessment: 0-10 Pain Score: 7  Faces Pain Scale: Hurts even more Pain Location: L hip Pain Descriptors / Indicators: Grimacing;Operative site guarding Pain Intervention(s): Limited activity within patient's tolerance;Monitored during session;Repositioned    Home Living Family/patient expects to be discharged to:: Skilled nursing facility Living Arrangements: Alone Available Help at Discharge: Family;Available 24 hours/day Type of Home: House Home Access: Level entry     Home Layout: One level Home Equipment: Walker - 2 wheels Additional Comments: Son lives behind and would be able to stay with her 24/7     Prior Function Level of Independence: Independent         Comments: Pt reports she was independent and still driving until the last week or so. Pt reports she has needed help walking around for the past week since she has been so weak.      Hand Dominance   Dominant Hand:  Right    Extremity/Trunk Assessment   Upper Extremity Assessment Upper Extremity Assessment: Defer to OT evaluation    Lower Extremity Assessment Lower Extremity Assessment: Generalized weakness    Cervical / Trunk Assessment Cervical / Trunk Assessment: Kyphotic  Communication   Communication: HOH  Cognition Arousal/Alertness: Awake/alert Behavior During Therapy: WFL for tasks assessed/performed Overall Cognitive Status: Within Functional Limits for tasks assessed                                         General Comments General comments (skin integrity, edema, etc.): dressing to sacrum - dry, clean    Exercises General Exercises - Lower Extremity Hip Flexion/Marching: Both;10 reps(able to clear L LE from floor; poor weight shift to L LE ) Mini-Sqauts: Both;10 reps(reduced weight shift to L LE)   Assessment/Plan    PT Assessment Patient needs continued PT services  PT Problem List Decreased strength;Decreased range of motion;Decreased activity tolerance;Decreased balance;Decreased mobility;Decreased knowledge of use of DME;Decreased safety awareness;Pain       PT Treatment Interventions Gait training;DME instruction;Functional mobility training;Therapeutic activities;Therapeutic exercise;Patient/family education    PT Goals (Current goals can be found in the Care Plan section)  Acute Rehab PT Goals Patient Stated Goal: regain independence PT Goal Formulation: With patient Time For Goal Achievement: 09/03/17 Potential to Achieve Goals: Good    Frequency Min 3X/week   Barriers to discharge        Co-evaluation               AM-PAC PT "6 Clicks" Daily Activity  Outcome Measure Difficulty turning over in bed (including adjusting bedclothes, sheets and blankets)?: Unable Difficulty moving from lying on back to sitting on the side of the bed? : Unable Difficulty sitting down on and standing up from a chair with arms (e.g., wheelchair, bedside commode, etc,.)?: Unable Help needed moving to and from a bed to chair (including a wheelchair)?: A Lot Help needed walking in hospital room?: A Lot Help needed climbing 3-5 steps with a railing? : Total 6 Click Score: 8    End of Session Equipment Utilized During Treatment: Gait belt Activity Tolerance: Patient tolerated treatment well Patient left: in chair;with family/visitor present;with call bell/phone within reach Nurse Communication: Mobility status PT Visit Diagnosis: Unsteadiness on  feet (R26.81);Other abnormalities of gait and mobility (R26.89);History of falling (Z91.81);Muscle weakness (generalized) (M62.81);Pain Pain - Right/Left: Left Pain - part of body: Hip    Time: 1005-1043 PT Time Calculation (min) (ACUTE ONLY): 38 min   Charges:   PT Evaluation $PT Eval Moderate Complexity: 1 Mod PT Treatments $Therapeutic Activity: 8-22 mins   PT G Codes:       Lanney Gins, PT, DPT 08/20/17 11:14 AM

## 2017-08-20 NOTE — [place] FL2 (Signed)
[place] MEDICAID FL2 LEVEL OF CARE SCREENING TOOL     IDENTIFICATION  Patient Name: Kristina [name] Birthdate: 05-27-1926 Sex: female Admission Date (Current Location): 08/17/2017  [facility] and [place] Number:  Herbalist and Address:  [facility] [place], [place] [address] [place], [place] [address]      Provider Number: [id number]  Attending Physician Name and Address:  Kerney Elbe, DO  Relative Name and Phone Number:       Current Level of Care: Hospital Recommended Level of Care: [place] Prior Approval Number:    Date Approved/Denied:   PASRR Number: [id number]  Discharge Plan:      Current Diagnoses: Patient Active Problem List   Diagnosis Date Noted  . Lt Hip fracture ([place]) 08/17/2017  . Fecal impaction in rectum ([place]) 08/17/2017  . Bilateral carotid bruits 07/18/2017  . Dizziness 12/09/2016  . Weakness 12/09/2016  . Malnutrition of moderate degree 12/03/2016  . Acute hyponatremia 12/01/2016  . Protein-calorie malnutrition ([place]) 12/01/2016  . Anemia 12/01/2016  . Pancreatic calcification 12/01/2016  . Abdominal pain, epigastric 12/01/2016  . Hyponatremia   . Claudication ([place]) 06/27/2016  . 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 Essential hypertension   . Hyperlipidemia with target low density lipoprotein (LDL) cholesterol less than 70 mg/dL   . Chronic kidney disease (CKD), stage III (moderate) (HCC)   . AAA (abdominal aortic aneurysm) (HCC)     Orientation RESPIRATION BLADDER Height & Weight     Self, Time, Situation, Place  O2([place] 2L) Incontinent, External catheter(catheter placed 08/19/17) Weight: 80 lb (36.3 kg) Height:  4\' 11"  (149.9 cm)  BEHAVIORAL SYMPTOMS/MOOD NEUROLOGICAL BOWEL NUTRITION STATUS      Incontinent Diet(regular)  AMBULATORY STATUS COMMUNICATION OF NEEDS Skin   Extensive  Assist Verbally Surgical wounds(closed left hip incision, silver hydrofiber dressing)                       Personal Care Assistance Level of Assistance  Bathing, Feeding, Dressing Bathing Assistance: Limited assistance Feeding assistance: Independent Dressing Assistance: Limited assistance     Functional Limitations Info  Sight, Hearing, Speech Sight Info: Adequate Hearing Info: Impaired Speech Info: Adequate    SPECIAL CARE FACTORS FREQUENCY  PT (By licensed PT), OT (By licensed OT)     PT Frequency: 5x/week OT Frequency: 5x/week            Contractures Contractures Info: Not present    Additional Factors Info  Code Status, Allergies Code Status Info: Fullcode Allergies Info: Allergies: Lipitor Atorvastatin, Pletal Cilostazol, Simvastatin           Current Medications (08/20/2017):  This is the current hospital active medication list Current Facility-Administered Medications  Medication Dose Route Frequency Provider Last Rate Last Dose  . 0.9 %  sodium chloride infusion  250 mL Intravenous PRN Emokpae, Courage, MD      . 0.9 %  sodium chloride infusion   Intravenous Continuous Sheikh, Omair Latif, DO      . acetaminophen (TYLENOL) tablet 650 mg  650 mg Oral Q6H PRN Leandrew Koyanagi, MD   650 mg at 08/19/17 2156   Or  . acetaminophen (TYLENOL) suppository 650 mg  650 mg Rectal Q6H PRN Leandrew Koyanagi, MD      . acidophilus (RISAQUAD) capsule 1 capsule  1 capsule Oral QHS Roxan Hockey, MD  1 capsule at 08/19/17 2157  . albuterol (PROVENTIL) (2.5 MG/3ML) 0.083% nebulizer solution 2.5 mg  2.5 mg Nebulization Q2H PRN Emokpae, Courage, MD      . alum & mag hydroxide-simeth (MAALOX/MYLANTA) 200-200-20 MG/5ML suspension 30 mL  30 mL Oral Q4H PRN Leandrew Koyanagi, MD      . bisacodyl (DULCOLAX) suppository 10 mg  10 mg Rectal Once Alfredia Ferguson, Omair Latif, DO      . calcium carbonate (TUMS - dosed in mg elemental calcium) chewable tablet 200 mg of elemental calcium  1 tablet  Oral Daily Sheikh, Omair Latif, DO   200 mg of elemental calcium at 08/20/17 1048  . enoxaparin (LOVENOX) injection 30 mg  30 mg Subcutaneous Q24H Raiford Noble [place], DO   30 mg at 08/19/17 2156  . feeding supplement (ENSURE ENLIVE) (ENSURE ENLIVE) liquid 237 mL  237 mL Oral BID BM Emokpae, Courage, MD   237 mL at 08/20/17 1052  . hydrALAZINE (APRESOLINE) injection 10 mg  10 mg Intravenous Q6H PRN Emokpae, Courage, MD      . hydrALAZINE (APRESOLINE) tablet 50 mg  50 mg Oral BID Emokpae, Courage, MD   50 mg at 08/20/17 1048  . lactated ringers infusion   Intravenous Continuous Barnet Glasgow, MD      . menthol-cetylpyridinium (CEPACOL) lozenge 3 mg  1 lozenge Oral PRN Leandrew Koyanagi, MD       Or  . phenol (CHLORASEPTIC) mouth spray 1 spray  1 spray Mouth/Throat PRN Leandrew Koyanagi, MD      . methocarbamol (ROBAXIN) tablet 500 mg  500 mg Oral Q6H PRN Leandrew Koyanagi, MD   500 mg at 08/20/17 1321   Or  . methocarbamol (ROBAXIN) 500 mg in dextrose 5 % 50 mL IVPB  500 mg Intravenous Q6H PRN Leandrew Koyanagi, MD      . metoCLOPramide (REGLAN) tablet 5-10 mg  5-10 mg Oral Q8H PRN Leandrew Koyanagi, MD       Or  . metoCLOPramide (REGLAN) injection 5-10 mg  5-10 mg Intravenous Q8H PRN Leandrew Koyanagi, MD   10 mg at 08/19/17 1702  . morphine 2 MG/ML injection 0.5 mg  0.5 mg Intravenous Q2H PRN Leandrew Koyanagi, MD   0.5 mg at 08/20/17 0028  . ondansetron (ZOFRAN) tablet 4 mg  4 mg Oral Q6H PRN Leandrew Koyanagi, MD       Or  . ondansetron [facility]) injection 4 mg  4 mg Intravenous Q6H PRN Leandrew Koyanagi, MD   4 mg at 08/19/17 1546  . oxyCODONE (Oxy IR/ROXICODONE) immediate release tablet 5 mg  5 mg Oral Q4H PRN [place] Brick, Courage, MD   5 mg at 08/20/17 1321  . oxyCODONE (Oxy IR/ROXICODONE) immediate release tablet 5-10 mg  5-10 mg Oral Q4H PRN Leandrew Koyanagi, MD   10 mg at 08/19/17 0175  . pantoprazole (PROTONIX) EC tablet 40 mg  40 mg Oral Daily Raiford Noble [place], DO   40 mg at 08/20/17 1048  . phosphorus (K PHOS NEUTRAL)  tablet 500 mg  500 mg Oral BID Sheikh, Omair Latif, DO      . polyethylene glycol (MIRALAX / GLYCOLAX) packet 17 g  17 g Oral BID Denton Brick, Courage, MD   17 g at 08/20/17 1048  . polyethylene glycol (MIRALAX / GLYCOLAX) packet 17 g  17 g Oral Daily PRN Emokpae, Courage, MD      . senna-docusate (Senokot-S) tablet 1 tablet  1 tablet Oral  BID Raiford Noble [place], [place]   1 tablet at 08/20/17 1048  . sodium chloride flush (NS) 0.9 % injection 3 mL  3 mL Intravenous Q12H Emokpae, Courage, MD   3 mL at 08/20/17 1048  . sodium chloride flush (NS) 0.9 % injection 3 mL  3 mL Intravenous PRN Emokpae, Courage, MD      . traZODone (DESYREL) tablet 50 mg  50 mg Oral QHS PRN Roxan Hockey, MD   50 mg at 08/19/17 2157     Discharge Medications: Please see discharge summary for a list of discharge medications.  Relevant Imaging Results:  Relevant Lab Results:   Additional Information SS#: [id number]  Geralynn Ochs, LCSW

## 2017-08-20 NOTE — Evaluation (Signed)
Occupational Therapy Evaluation Patient Details Name: Kristina [name] MRN: [id number] DOB: Mar 18, 1926 Today's Date: 08/20/2017    History of Present Illness 84 yo female s/p L IM nail hip with foley placed and urology consult pending pmh: hypertension, chronic anemia and CKD stage III    Clinical Impression   Patient is s/p L IM nail  surgery resulting in functional limitations due to the deficits listed below (see OT problem list). Pt demonstrates total (A) for LB adls and max (A) for stand pivot.  Patient will benefit from skilled OT acutely to increase independence and safety with ADLS to allow discharge SNF.     Follow Up Recommendations  SNF    Equipment Recommendations  3 in 1 bedside commode;Wheelchair (measurements OT);Wheelchair cushion (measurements OT);Hospital bed    Recommendations for Other Services       Precautions / Restrictions Precautions Precautions: Fall Restrictions Weight Bearing Restrictions: No LLE Weight Bearing: Weight bearing as tolerated      Mobility Bed Mobility Overal bed mobility: Needs Assistance Bed Mobility: Supine to Sit     Supine to sit: Max assist     General bed mobility comments: pt exiting to the R side with therapist using pad to pivot toward the EOB. pt reports "that wasnt bad at all"   Transfers Overall transfer level: Needs assistance Equipment used: Rolling walker (2 wheeled) Transfers: Sit to/from [facility] Sit to Stand: Mod assist Stand pivot transfers: Max assist       General transfer comment: pt with narrowed based of support, requires total )A) to turn    Balance Overall balance assessment: Needs assistance Sitting-balance support: Bilateral upper extremity supported;Feet supported Sitting balance-Leahy Scale: Poor       Standing balance-Leahy Scale: Poor                             ADL either performed or assessed with clinical judgement   ADL Overall ADL's : Needs  assistance/impaired Eating/Feeding: Set up;Sitting Eating/Feeding Details (indicate cue type and reason): pt reports incr PO intake today compared to yesterday Grooming: Wash/dry hands;Set up                   Toilet Transfer: Maximal assistance;Stand-pivot;BSC             General ADL Comments: pt reports no nausea and agreeable to OOB. pt expressed feeling cold so warm blanket provided during session     Vision   Vision Assessment?: No apparent visual deficits     Perception     Praxis      Pertinent Vitals/Pain Pain Assessment: Faces Faces Pain Scale: Hurts even more Pain Location: L leg Pain Descriptors / Indicators: Operative site guarding Pain Intervention(s): Monitored during session;Premedicated before session;Repositioned;Ice applied     Hand Dominance Right   Extremity/Trunk Assessment Upper Extremity Assessment Upper Extremity Assessment: Generalized weakness   Lower Extremity Assessment Lower Extremity Assessment: Defer to PT evaluation   Cervical / Trunk Assessment Cervical / Trunk Assessment: Kyphotic   Communication Communication Communication: HOH   Cognition Arousal/Alertness: Awake/alert Behavior During Therapy: WFL for tasks assessed/performed Overall Cognitive Status: Within Functional Limits for tasks assessed                                     General Comments  dressing dry and intact    Exercises  Shoulder Instructions      Home Living Family/patient expects to be discharged to:: Skilled nursing facility Living Arrangements: Alone Available Help at Discharge: Family;Available 24 hours/day Type of Home: House Home Access: Level entry     Home Layout: One level     Bathroom Shower/Tub: Teacher, early years/pre: Standard     Home Equipment: Environmental consultant - 2 wheels   Additional Comments: Son lives behind and would be able to stay with her 24/7       Prior Functioning/Environment Level of  Independence: Independent        Comments: Pt reports she was independent and still driving until the last week or so. Pt reports she has needed help walking around for the past week since she has been so weak.         OT Problem List: Decreased strength;Decreased activity tolerance;Impaired balance (sitting and/or standing);Decreased safety awareness;Decreased knowledge of use of DME or AE;Decreased knowledge of precautions;Pain      OT Treatment/Interventions: Self-care/ADL training;Therapeutic exercise;DME and/or AE instruction;Therapeutic activities;Patient/family education;Balance training    OT Goals(Current goals can be found in the care plan section) Acute Rehab OT Goals Patient Stated Goal: none stated  OT Goal Formulation: Patient unable to participate in goal setting Time For Goal Achievement: 09/03/17 Potential to Achieve Goals: Good  OT Frequency: Min 2X/week   Barriers to D/C:            Co-evaluation              AM-PAC PT "6 Clicks" Daily Activity     Outcome Measure Help from another person eating meals?: A Little Help from another person taking care of personal grooming?: A Little Help from another person toileting, which includes using toliet, bedpan, or urinal?: A Lot Help from another person bathing (including washing, rinsing, drying)?: Total Help from another person to put on and taking off regular upper body clothing?: A Lot Help from another person to put on and taking off regular lower body clothing?: Total 6 Click Score: 12   End of Session Equipment Utilized During Treatment: Gait belt;Rolling walker;Oxygen Nurse Communication: Mobility status;Precautions;Weight bearing status  Activity Tolerance: Patient tolerated treatment well Patient left: in chair;with call bell/phone within reach  OT Visit Diagnosis: Unsteadiness on feet (R26.81);Pain Pain - Right/Left: Left Pain - part of body: Leg                Time: 4970-2637 OT Time  Calculation (min): 23 min Charges:  OT General Charges $OT Visit: 1 Visit OT Evaluation $OT Eval Moderate Complexity: 1 Mod G-Codes:      Jeri Modena   OTR/L Pager: [phone] Office: [phone] .   Parke Poisson B 08/20/2017, 9:10 AM

## 2017-08-21 ENCOUNTER — Encounter (HOSPITAL_COMMUNITY): Payer: Self-pay | Admitting: Orthopaedic Surgery

## 2017-08-21 ENCOUNTER — Inpatient Hospital Stay (HOSPITAL_COMMUNITY): Payer: Medicare Other

## 2017-08-21 DIAGNOSIS — R0602 Shortness of breath: Secondary | ICD-10-CM

## 2017-08-21 DIAGNOSIS — Z01811 Encounter for preprocedural respiratory examination: Secondary | ICD-10-CM

## 2017-08-21 LAB — CBC WITH DIFFERENTIAL/PLATELET
BASOS PCT: 0 %
Basophils Absolute: 0 10*3/uL (ref 0.0–0.1)
EOS ABS: 0.3 10*3/uL (ref 0.0–0.7)
EOS PCT: 3 %
HCT: 27.8 % — ABNORMAL LOW (ref 36.0–46.0)
Hemoglobin: 8.6 g/dL — ABNORMAL LOW (ref 12.0–15.0)
LYMPHS ABS: 1 10*3/uL (ref 0.7–4.0)
Lymphocytes Relative: 10 %
MCH: 30 pg (ref 26.0–34.0)
MCHC: 30.9 g/dL (ref 30.0–36.0)
MCV: 96.9 fL (ref 78.0–100.0)
Monocytes Absolute: 1.4 10*3/uL — ABNORMAL HIGH (ref 0.1–1.0)
Monocytes Relative: 13 %
Neutro Abs: 7.7 10*3/uL (ref 1.7–7.7)
Neutrophils Relative %: 74 %
PLATELETS: 333 10*3/uL (ref 150–400)
RBC: 2.87 MIL/uL — AB (ref 3.87–5.11)
RDW: 14 % (ref 11.5–15.5)
WBC: 10.3 10*3/uL (ref 4.0–10.5)

## 2017-08-21 LAB — COMPREHENSIVE METABOLIC PANEL
ALBUMIN: 2.5 g/dL — AB (ref 3.5–5.0)
ANION GAP: 9 (ref 5–15)
AST: 29 U/L (ref 15–41)
Alkaline Phosphatase: 47 U/L (ref 38–126)
BUN: 42 mg/dL — ABNORMAL HIGH (ref 6–20)
CHLORIDE: 103 mmol/L (ref 101–111)
CO2: 28 mmol/L (ref 22–32)
CREATININE: 1.13 mg/dL — AB (ref 0.44–1.00)
Calcium: 8.7 mg/dL — ABNORMAL LOW (ref 8.9–10.3)
GFR calc non Af Amer: 42 mL/min — ABNORMAL LOW (ref >60)
GFR, EST AFRICAN AMERICAN: 48 mL/min — AB (ref >60)
Glucose, Bld: 112 mg/dL — ABNORMAL HIGH (ref 65–99)
Potassium: 4.8 mmol/L (ref 3.5–5.1)
SODIUM: 140 mmol/L (ref 135–145)
Total Bilirubin: 0.6 mg/dL (ref 0.3–1.2)
Total Protein: 5.3 g/dL — ABNORMAL LOW (ref 6.5–8.1)

## 2017-08-21 LAB — URINE CULTURE: CULTURE: NO GROWTH

## 2017-08-21 LAB — MAGNESIUM: Magnesium: 1.9 mg/dL (ref 1.7–2.4)

## 2017-08-21 LAB — PHOSPHORUS: PHOSPHORUS: 2.5 mg/dL (ref 2.5–4.6)

## 2017-08-21 MED ORDER — FUROSEMIDE 10 MG/ML IJ SOLN
20.0000 mg | Freq: Once | INTRAMUSCULAR | Status: AC
  Administered 2017-08-21: 20 mg via INTRAVENOUS
  Filled 2017-08-21: qty 2

## 2017-08-21 MED ORDER — ENSURE ENLIVE PO LIQD: 237.0000 mL | Freq: Two times a day (BID) | ORAL | 12 refills | Status: DC

## 2017-08-21 MED ORDER — PANTOPRAZOLE SODIUM 40 MG PO TBEC: 40.0000 mg | DELAYED_RELEASE_TABLET | Freq: Every day | ORAL | 0 refills | Status: DC

## 2017-08-21 NOTE — Progress Notes (Signed)
Rt found pt on room air, O2 sat 89%.  RT placed patient back on [place] 1 Lpm, sat improved to 95%.  RN notified.  RT instructed pt on the use of flutter valve. Pt able to demonstrate back good technique.

## 2017-08-21 NOTE — Clinical Social Work Placement (Signed)
   CLINICAL SOCIAL WORK PLACEMENT  NOTE  Date:  08/21/2017  Patient Details  Name: Kristina [name] MRN: [id number] Date of Birth: 09-03-1925  Clinical Social Work is seeking post-discharge placement for this patient at the [place] level of care (*CSW will initial, date and re-position this form in  chart as items are completed):  Yes   Patient/family provided with [facility] Work Department's list of facilities offering this level of care within the geographic area requested by the patient (or if unable, by the patient's family).  Yes   Patient/family informed of their freedom to choose among providers that offer the needed level of care, that participate in Medicare, Medicaid or managed care program needed by the patient, have an available bed and are willing to accept the patient.  Yes   Patient/family informed of [facility]'s ownership interest in [facility] and [facility], as well as of the fact that they are under no obligation to receive care at these facilities.  PASRR submitted to EDS on       PASRR number received on 08/20/17     Existing PASRR number confirmed on       FL2 transmitted to all facilities in geographic area requested by pt/family on 08/20/17     FL2 transmitted to all facilities within larger geographic area on       Patient informed that his/her managed care company has contracts with or will negotiate with certain facilities, including the following:        Yes   Patient/family informed of bed offers received.  Patient chooses bed at [facility]     Physician recommends and patient chooses bed at      Patient to be transferred to [facility] on 08/21/17.  Patient to be transferred to facility by [facility]     Patient family notified on 08/21/17 of transfer.  Name of family member notified:  daughter and daughter n law advised     PHYSICIAN       Additional Comment:     _______________________________________________ Normajean Baxter, LCSW 08/21/2017, 12:02 PM

## 2017-08-21 NOTE — Discharge Summary (Signed)
Physician Discharge Summary  Kristina [name]:[id number] DOB: 04/23/1926 DOA: 08/17/2017  PCP: Kristina Sheller, MD  Admit date: 08/17/2017 Discharge date: 08/21/2017  Admitted From: Home Disposition: SNF  Recommendations for Outpatient Follow-up:  1. Follow up with PCP in 1-2 weeks 2. Follow up with Orthopedics Dr. Erlinda [name] as an outpatient  3. Follow up with Urology Dr. Junious [name] as an outpatient for TOV and foley cathter removal  4. Please obtain CMP/CBC, Mag, Phos in one week 5. Please follow up on the following pending results:  Home Health: No Equipment/Devices: None; Will determine at next venue of care  Discharge Condition: Stable  CODE STATUS: FULL CODE   Diet recommendation: Regular Diet  Brief/Interim Summary: The patient is an 84 y.o.femalewith past medical history relevant for hypertension, chronic anemia and CKD stage III who presented to the ED with complaints of left hip fall after falling out of bed overnight.Transferred to [facility] and underwent Left Hip Intramedullary Implant and is POD3. Hospitalization has been complicated by Acute Blood Loss Anemia, Constipation, and Acute Urinary Retention. She is improved and PT/OT evaluated and recommending SNF. Patient deemed medically stable to D/C to SNF today and will follow up with PCP, Orthopedics, and Urology as an outpatient.   Discharge Diagnoses:  Principal Problem:   Lt Hip fracture ([place]) Active Problems:   Essential hypertension   Chronic kidney disease (CKD), stage III (moderate) (HCC)   Hyponatremia   Fecal impaction in rectum (HCC)  Left Hip Fracture s/p Intramedullary Implant by Dr. Erlinda [name] POD3 -IVF Rehydration D/C'd   -Pain Control and VTE Prophylaxis per Ortho; C/w Bowel Regimen  -WBAT operative Extremity and Up with PT/OT -PT/OT recommending SNF -Social Work Consulted for SNF placement -Follow up with PCP and Orthopedics as an outpatient   Fecal Impaction/Constipation, improved -Continue with  laxatives and stool softeners and if no BM will need possible manual disimpaction by the nursing staff. -Added Biascodyl Suppository -may Try Enema  Acute Blood Loss in the setting of Anemia of Chronic Kidney Disease  -Blood Count dropped to 7.4/23.3 afetr surgery  -S/p Transfusion of 1 unit of pRBC's and Hb/Hct improved to 9.1/28.9; Hb/Hct now is 8.6/27.8 -Continue to Monitor for S/Sx of Bleeding as patient is on sq Lovenox per Ortho and transfuse as necessary  -Repeat CBC in AM   Hypertension and Hx of Renal Artery Stenosis  -C/w Hydralazine 50 mg po BID -Given 10 mg IV Hydralazine q6hprn for SBP >170 while hospitalized -Follow up with PCP for further BP management   Leukocytosis, improved  -CXR Negative; Repeat CXR showed no Active Disease -Likely Reactive from Pain/Surgery -WBC went from 17.8 -> 9.8 -> 10.3 -Continue to monitor for S/Sx of Infection -Repeat CBC as an outpatient  AKI on CKD Stage 3, improved  -Likely from Hypovolemia from Surgery -Transfused 1 unit of Blood -? If urinary Retention causing worsening; Replaced Foley by Urology -IVF now D/'C'd; Gave 20 mg of IV Lasix today as patient is +5 Liters almost -BUN/Cr now 42/1.13 and stable.  -Repeat CMP as an outpatient  Acute Urinary Retention -Urology consulted as nursing could not place foley -Checked U/A and showed Clear appearance, Small Hb, Neative Leukocytes, Negative Nitrites, Rare Bacteria, and 6-30 WBC and Urine Cx Showed No Growth -C/w Foley and appreciated Urology Input -Urology recommending leaving foley in until patient is more lucid and ambulatory; -Will have patient follow up with Dr. Junious [name] as an outpatient for TOV and foley removal.   Hypophosphatemia -Patient's Phos Level was 2.2  and improved to 2.5 today -Replete with K Phos Neutral 500 mg po BID x 2 doses yesterday -Continue to Monitor and Replete as Necessary -Repeat Phos Level as an outpatient   GERD -C/w Pantoprazole 40 mg po  Daily   Hyponatremia -Improved with IVF Rehydration   Underweight -C/w Feeding Supplements and Regular Diet  Discharge Instructions  Discharge Instructions    Call MD for:  difficulty breathing, headache or visual disturbances   Complete by:  As directed    Call MD for:  extreme fatigue   Complete by:  As directed    Call MD for:  hives   Complete by:  As directed    Call MD for:  persistant dizziness or light-headedness   Complete by:  As directed    Call MD for:  persistant nausea and vomiting   Complete by:  As directed    Call MD for:  redness, tenderness, or signs of infection (pain, swelling, redness, odor or green/yellow discharge around incision site)   Complete by:  As directed    Call MD for:  severe uncontrolled pain   Complete by:  As directed    Call MD for:  temperature >100.4   Complete by:  As directed    Diet general   Complete by:  As directed    Discharge instructions   Complete by:  As directed    Follow up with PCP, Urology, and Orthopedics as an outpatient. Take all medications as prescribed. If symptoms change or worsen please return to the ED for evaluation.   Increase activity slowly   Complete by:  As directed    Weight bearing as tolerated   Complete by:  As directed      Allergies as of 08/21/2017      Reactions   Lipitor [atorvastatin] Other (See Comments)   Reaction:  Unknown    Pletal [cilostazol] Other (See Comments)   Reaction:  Unknown    Simvastatin    MYALGIAS      Medication List    TAKE these medications   ALIGN 4 MG Caps Take 4 mg by mouth at bedtime.   aspirin 81 MG tablet Take 81 mg by mouth daily.   COLACE PO Take 1 tablet by mouth daily.   CoQ10 100 MG Caps Take 100 mg by mouth 3 (three) times daily with meals.   enoxaparin 40 MG/0.4ML injection Commonly known as:  LOVENOX Inject 0.4 mLs (40 mg total) into the skin daily.   feeding supplement (ENSURE ENLIVE) Liqd Take 237 mLs by mouth 2 (two) times daily  between meals.   hydrALAZINE 50 MG tablet Commonly known as:  APRESOLINE Take 50 mg by mouth 2 (two) times daily.   HYDROcodone-acetaminophen 7.5-325 MG tablet Commonly known as:  NORCO Take 1-2 tablets by mouth every 6 (six) hours as needed for moderate pain.   pantoprazole 40 MG tablet Commonly known as:  PROTONIX Take 1 tablet (40 mg total) by mouth daily. Start taking on:  08/22/2017   polyethylene glycol packet Commonly known as:  MIRALAX / GLYCOLAX Take 17 g by mouth daily.            Discharge Care Instructions  (From admission, onward)        Start     Ordered   08/18/17 0000  Weight bearing as tolerated     08/18/17 1537     Follow-up Information    Leandrew Koyanagi, MD In 2 weeks.   Specialty:  Orthopedic Surgery Why:  For suture removal, For wound re-check Contact information: [place] [id number] [phone]          Allergies  Allergen Reactions  . Lipitor [Atorvastatin] Other (See Comments)    Reaction:  Unknown   . Pletal [Cilostazol] Other (See Comments)    Reaction:  Unknown   . Simvastatin     MYALGIAS   Consultations:  Orthopedic Surgery   Procedures/Studies: Dg Chest 1 View  Result Date: 08/17/2017 CLINICAL DATA:  Patient rolled out of bed last night and has obvious deformity of the left leg. The patient denies loss of consciousness. No chest complaints. History of hypertension EXAM: CHEST 1 VIEW COMPARISON:  Chest x-ray of Dec 09, 2016 FINDINGS: The lungs are adequately inflated. There is no focal infiltrate. There is no pleural effusion. The heart and pulmonary vascularity are normal. The mediastinum is normal in width. There calcification in the wall of the aortic arch. The bony thorax exhibits no acute abnormality. IMPRESSION: There is no active cardiopulmonary disease. Thoracic aortic atherosclerosis. Electronically Signed   By: David  Martinique M.D.   On: 08/17/2017 13:39   Ct Hip Left Wo  Contrast  Result Date: 08/17/2017 CLINICAL DATA:  The patient suffered a left hip fracture last night which. Initial EXAM: CT OF THE LEFT HIP WITHOUT CONTRAST TECHNIQUE: Multidetector CT imaging of the left hip was performed according to the standard protocol. Multiplanar CT image reconstructions were also generated. COMPARISON:  Plain films left hip earlier today. FINDINGS: Bones/Joint/Cartilage As seen on the comparison plain films, the patient has an intertrochanteric fracture of the left hip. The greater trochanter is comminuted. The femoral neck is angulated 90 degrees relative to the hip joint. Bones appear osteopenic. No other fracture is identified. Ligaments Suboptimally assessed by CT. Muscles and Tendons Intact. There is some edema and hematoma about the patient's fracture. Soft tissues Soft tissue contusion over the left hip is noted. Large stool ball is present in the rectum. Extensive sigmoid diverticulosis is present. Extensive atherosclerotic vascular disease is noted. IMPRESSION: Acute left intertrochanteric fracture as described above. Large stool ball in the rectum compatible with fecal impaction. Diverticulosis without diverticulitis. Atherosclerosis. Electronically Signed   By: Inge Rise M.D.   On: 08/17/2017 15:29   Dg Chest Port 1 View  Result Date: 08/21/2017 CLINICAL DATA:  Shortness of breath. EXAM: PORTABLE CHEST 1 VIEW COMPARISON:  Chest x-ray dated August 17, 2017. FINDINGS: The patient is rotated to the left. The heart size and mediastinal contours are within normal limits. Normal pulmonary vascularity. Atherosclerotic calcification of the aortic arch. No focal consolidation, pleural effusion, or pneumothorax. No acute osseous abnormality. IMPRESSION: No active disease. Electronically Signed   By: Titus Dubin M.D.   On: 08/21/2017 12:39   Dg C-arm 1-60 Min  Result Date: 08/18/2017 CLINICAL DATA:  Elective surgery.  Intramedullary rod. EXAM: LEFT FEMUR 2 VIEWS; DG  C-ARM 61-120 MIN COMPARISON:  CT of the left hip 08/17/2017. FLUOROSCOPY TIME:  Fluoroscopy Time:  1 minute 43 seconds Number of Acquired Spot Images: 0 FINDINGS: Proximal and distal ends of the intramedullary rod are demonstrated without complication. Femoral neck screws are in place. Fracture is reduced. IMPRESSION: ORIF of the left hip without radiographic evidence for complication. Electronically Signed   By: San Morelle M.D.   On: 08/18/2017 15:52   Dg Hip Unilat With Pelvis 2-3 Views Left  Result Date: 08/17/2017 CLINICAL DATA:  Patient rolled off a bed last night. Obvious  abnormal rotation of the left leg. EXAM: DG HIP (WITH OR WITHOUT PELVIS) 2-3V LEFT COMPARISON:  Coronal and sagittal CT reconstructed images from a scan of November 25, 2016 FINDINGS: Patient has sustained an acute angulated fracture involving the base of the neck and the intertrochanteric region. The left femoral head remains appropriately positioned within the acetabulum. The left hemipelvis is normal. The sub trochanteric region of the left femur is normal. IMPRESSION: Acute angulated fracture involving the base of the neck and the intertrochanteric region of the left hip. Electronically Signed   By: David  Martinique M.D.   On: 08/17/2017 13:38   Dg Femur Min 2 Views Left  Result Date: 08/18/2017 CLINICAL DATA:  Elective surgery.  Intramedullary rod. EXAM: LEFT FEMUR 2 VIEWS; DG C-ARM 61-120 MIN COMPARISON:  CT of the left hip 08/17/2017. FLUOROSCOPY TIME:  Fluoroscopy Time:  1 minute 43 seconds Number of Acquired Spot Images: 0 FINDINGS: Proximal and distal ends of the intramedullary rod are demonstrated without complication. Femoral neck screws are in place. Fracture is reduced. IMPRESSION: ORIF of the left hip without radiographic evidence for complication. Electronically Signed   By: San Morelle M.D.   On: 08/18/2017 15:52    Subjective: Seen and examined at bedside and was doing well. No CP or SOB. Had some  left hip pain but not bad. No other concerns or complaints and ready to go to SNF.   Discharge Exam: Vitals:   08/21/17 0604 08/21/17 1038  BP: (!) 179/57   Pulse: 89   Resp: 17   Temp: 99.4 F (37.4 C)   SpO2: 97% 95%   Vitals:   08/20/17 1426 08/20/17 2049 08/21/17 0604 08/21/17 1038  BP: (!) 143/54 137/70 (!) 179/57   Pulse: 85 (!) 114 89   Resp: 16 18 17    Temp: 98 F (36.7 C) 98.8 F (37.1 C) 99.4 F (37.4 C)   TempSrc: Oral Oral Oral   SpO2: 100% 97% 97% 95%  Weight:      Height:       General: Pt is a thin Caucasian female who is alert, awake, not in acute distress Cardiovascular: RRR, S1/S2 +, no rubs, no gallops Respiratory: Diminished bilaterally, no wheezing, no rhonchi Abdominal: Soft, NT, ND, bowel sounds + Extremities: no edema, no cyanosis; Left Leg incisions had mild dried blood but appeared C/D/I  The results of significant diagnostics from this hospitalization (including imaging, microbiology, ancillary and laboratory) are listed below for reference.    Microbiology: Recent Results (from the past 240 hour(s))  Surgical PCR screen     Status: None   Collection Time: 08/17/17 11:10 PM  Result Value Ref Range Status   MRSA, PCR NEGATIVE NEGATIVE Final   Staphylococcus aureus NEGATIVE NEGATIVE Final    Comment: (NOTE) The Xpert SA Assay (FDA approved for NASAL specimens in patients 60 years of age and older), is one component of a comprehensive surveillance program. It is not intended to diagnose infection nor to guide or monitor treatment.   Culture, Urine     Status: None   Collection Time: 08/18/17  2:48 PM  Result Value Ref Range Status   Specimen Description URINE, CLEAN CATCH  Final   Special Requests NONE  Final   Culture NO GROWTH  Final   Report Status 08/20/2017 FINAL  Final  Urine Culture     Status: None   Collection Time: 08/20/17  1:03 AM  Result Value Ref Range Status   Specimen Description URINE, RANDOM  Final   Special Requests  NONE  Final   Culture NO GROWTH  Final   Report Status 08/21/2017 FINAL  Final    Labs: BNP (last 3 results) No results for input(s): BNP in the last 8760 hours. Basic Metabolic Panel: Recent Labs  Lab 08/17/17 1319 08/18/17 0547 08/18/17 2017 08/19/17 0538 08/20/17 0936 08/21/17 0642  NA 132* 133*  --  133* 136 140  K 4.0 4.2  --  3.7 4.0 4.8  CL 94* 98*  --  99* 101 103  CO2 28 27  --  21* 25 28  GLUCOSE 124* 105*  --  126* 161* 112*  BUN 35* 41*  --  45* 44* 42*  CREATININE 1.17* 1.31* 1.37* 1.57* 1.38* 1.13*  CALCIUM 9.5 9.1  --  8.4* 9.0 8.7*  MG  --   --   --   --  2.0 1.9  PHOS  --   --   --   --  2.2* 2.5   Liver Function Tests: Recent Labs  Lab 08/20/17 0936 08/21/17 0642  AST 43* 29  ALT <5* <5*  ALKPHOS 48 47  BILITOT 0.7 0.6  PROT 5.8* 5.3*  ALBUMIN 2.8* 2.5*   No results for input(s): LIPASE, AMYLASE in the last 168 hours. No results for input(s): AMMONIA in the last 168 hours. CBC: Recent Labs  Lab 08/17/17 1319 08/18/17 0547 08/18/17 2017 08/19/17 0538 08/19/17 1546 08/20/17 0936 08/21/17 0642  WBC 14.0* 15.0* 17.8* 12.3*  --  9.8 10.3  NEUTROABS 13.0*  --   --  10.1*  --  8.0* 7.7  HGB 9.9* 8.9* 8.8* 7.4* 8.8* 9.1* 8.6*  HCT 29.6* 27.0* 27.5* 23.3* 26.8* 28.9* 27.8*  MCV 91.4 92.8 94.2 94.3  --  95.4 96.9  PLT 444* 365 385 350  --  338 333   Cardiac Enzymes: No results for input(s): CKTOTAL, CKMB, CKMBINDEX, TROPONINI in the last 168 hours. BNP: Invalid input(s): POCBNP CBG: No results for input(s): GLUCAP in the last 168 hours. D-Dimer No results for input(s): DDIMER in the last 72 hours. Hgb A1c No results for input(s): HGBA1C in the last 72 hours. Lipid Profile No results for input(s): CHOL, HDL, LDLCALC, TRIG, CHOLHDL, LDLDIRECT in the last 72 hours. Thyroid function studies No results for input(s): TSH, T4TOTAL, T3FREE, THYROIDAB in the last 72 hours.  Invalid input(s): FREET3 Anemia work up No results for input(s):  VITAMINB12, FOLATE, FERRITIN, TIBC, IRON, RETICCTPCT in the last 72 hours. Urinalysis    Component Value Date/Time   COLORURINE YELLOW 08/19/2017 2118   APPEARANCEUR CLEAR 08/19/2017 2118   LABSPEC 1.021 08/19/2017 2118   PHURINE 5.0 08/19/2017 2118   GLUCOSEU NEGATIVE 08/19/2017 2118   HGBUR SMALL (A) 08/19/2017 2118   BILIRUBINUR NEGATIVE 08/19/2017 2118   [place] NEGATIVE 08/19/2017 2118   PROTEINUR NEGATIVE 08/19/2017 2118   NITRITE NEGATIVE 08/19/2017 2118   LEUKOCYTESUR NEGATIVE 08/19/2017 2118   Sepsis Labs Invalid input(s): PROCALCITONIN,  WBC,  LACTICIDVEN Microbiology Recent Results (from the past 240 hour(s))  Surgical PCR screen     Status: None   Collection Time: 08/17/17 11:10 PM  Result Value Ref Range Status   MRSA, PCR NEGATIVE NEGATIVE Final   Staphylococcus aureus NEGATIVE NEGATIVE Final    Comment: (NOTE) The Xpert SA Assay (FDA approved for NASAL specimens in patients 7 years of age and older), is one component of a comprehensive surveillance program. It is not intended to diagnose infection nor to guide or monitor treatment.  Culture, Urine     Status: None   Collection Time: 08/18/17  2:48 PM  Result Value Ref Range Status   Specimen Description URINE, CLEAN CATCH  Final   Special Requests NONE  Final   Culture NO GROWTH  Final   Report Status 08/20/2017 FINAL  Final  Urine Culture     Status: None   Collection Time: 08/20/17  1:03 AM  Result Value Ref Range Status   Specimen Description URINE, RANDOM  Final   Special Requests NONE  Final   Culture NO GROWTH  Final   Report Status 08/21/2017 FINAL  Final   Time coordinating discharge: 35 minutes  SIGNED:  Kerney Elbe, DO [facility] 08/21/2017, 1:07 PM Pager [phone]  If 7PM-7AM, please contact night-coverage [email or web address] Password [email or web address]

## 2017-08-21 NOTE — Progress Notes (Signed)
RN called report to [place] [facility]. All belongings gathered to be sent with pt. Pt in no distress at time of discharge. Is being transported via [place].

## 2017-08-21 NOTE — Progress Notes (Addendum)
Initial Nutrition Assessment  DOCUMENTATION CODES:   Underweight  INTERVENTION:    Continue Ensure Enlive po BID, each supplement provides 350 kcal and 20 grams of protein  NUTRITION DIAGNOSIS:   Increased nutrient needs related to post-op healing as evidenced by estimated needs  GOAL:   Patient will meet greater than or equal to 90% of their needs  MONITOR:   PO intake, Supplement acceptance, Weight trends, Labs, Skin  REASON FOR ASSESSMENT:   Consult Hip fracture protocol, Assessment of nutrition requirement/status  ASSESSMENT:   84 y.o. Female with PMH relevant for hypertension, chronic anemia and CKD stage III who presented to the ED with complaints of left hip fall after falling out of bed overnight.  S/p procedure 1/18: LEFT HIP INTRAMEDULLARY IMPLANT   Pt is currently working with PT. Did not disturb. Medications include Reglan, Miralax and K Phos. Ensure Enlive supplement ordered BID 1/18.  PO intake 50% per flowsheet records. Labs reviewed. Ca 8.7 (L). CBG's [id number].  NUTRITION - FOCUSED PHYSICAL EXAM:  Unable to complete at this time. Suspect malnutrition.  Diet Order:  Diet regular Room service appropriate? Yes; Fluid consistency: Thin  EDUCATION NEEDS:   No education needs have been identified at this time  Skin:  Skin Assessment: Skin Integrity Issues: Skin Integrity Issues:: Incisions Incisions: L hip  Last BM:  1/19  Height:   Ht Readings from Last 1 Encounters:  08/18/17 4\' 11"  (1.499 m)   Weight:   Wt Readings from Last 1 Encounters:  08/18/17 80 lb (36.3 kg)   Ideal Body Weight:  44.5 kg  BMI:  Body mass index is 16.16 kg/m.  Estimated Nutritional Needs:   Kcal:  1100-1300  Protein:  50-65 gm  Fluid:  >/= 1.5 L  Arthur Holms, RD, LDN Pager #: [phone] After-Hours Pager #: [phone]

## 2017-08-21 NOTE — Progress Notes (Signed)
Physical Therapy Treatment Patient Details Name: Kristina [name] MRN: [id number] DOB: 1926-02-26 Today's Date: 08/21/2017    History of Present Illness 84 yo female s/p L IM nail with foley placed and urology consult pending pmh: hypertension, chronic anemia and CKD stage III     PT Comments    Patient is making progress toward mobility goals. Pt required max A for bed mobility and mod A for ambulation. Continue to progress as tolerated with anticipated d/c to SNF for further skilled PT services.     Follow Up Recommendations  SNF;Supervision for mobility/OOB     Equipment Recommendations  (will determine at next venue of care)    Recommendations for Other Services       Precautions / Restrictions Precautions Precautions: Fall Restrictions Weight Bearing Restrictions: Yes LLE Weight Bearing: Weight bearing as tolerated    Mobility  Bed Mobility Overal bed mobility: Needs Assistance Bed Mobility: Supine to Sit     Supine to sit: Max assist     General bed mobility comments: assist to bring bilat LE to EOB, scoot hips toward EOB, and to elevate trunk into sitting  Transfers Overall transfer level: Needs assistance Equipment used: Rolling walker (2 wheeled) Transfers: Sit to/from Stand Sit to Stand: Mod assist         General transfer comment: assist to power up into standing; cues for safe hand placement  Ambulation/Gait Ambulation/Gait assistance: Mod assist Ambulation Distance (Feet): 16 Feet Assistive device: Rolling walker (2 wheeled) Gait Pattern/deviations: Step-to pattern;Decreased stance time - left;Decreased step length - right;Decreased weight shift to left;Antalgic Gait velocity: decreased   General Gait Details: cues for sequencing and posture; assist to weight shift, balance, and manage RW   Stairs            Wheelchair Mobility    Modified Rankin (Stroke Patients Only)       Balance Overall balance assessment: Needs  assistance Sitting-balance support: Bilateral upper extremity supported;Feet supported Sitting balance-Leahy Scale: Fair     Standing balance support: Bilateral upper extremity supported Standing balance-Leahy Scale: Poor                              Cognition Arousal/Alertness: Awake/alert Behavior During Therapy: WFL for tasks assessed/performed Overall Cognitive Status: Within Functional Limits for tasks assessed                                        Exercises      General Comments        Pertinent Vitals/Pain Pain Assessment: Faces Faces Pain Scale: Hurts little more Pain Location: L hip Pain Descriptors / Indicators: Grimacing;Guarding;Sore Pain Intervention(s): Limited activity within patient's tolerance;Monitored during session;Premedicated before session;Repositioned    Home Living                      Prior Function            PT Goals (current goals can now be found in the care plan section) Acute Rehab PT Goals Patient Stated Goal: regain independence PT Goal Formulation: With patient Time For Goal Achievement: 09/03/17 Potential to Achieve Goals: Good Progress towards PT goals: Progressing toward goals    Frequency    Min 3X/week      PT Plan Current plan remains appropriate    Co-evaluation  AM-PAC PT "6 Clicks" Daily Activity  Outcome Measure  Difficulty turning over in bed (including adjusting bedclothes, sheets and blankets)?: Unable Difficulty moving from lying on back to sitting on the side of the bed? : Unable Difficulty sitting down on and standing up from a chair with arms (e.g., wheelchair, bedside commode, etc,.)?: Unable Help needed moving to and from a bed to chair (including a wheelchair)?: A Lot Help needed walking in hospital room?: A Lot Help needed climbing 3-5 steps with a railing? : Total 6 Click Score: 8    End of Session Equipment Utilized During Treatment:  Gait belt Activity Tolerance: Patient tolerated treatment well Patient left: in chair;with family/visitor present;with call bell/phone within reach Nurse Communication: Mobility status PT Visit Diagnosis: Unsteadiness on feet (R26.81);Other abnormalities of gait and mobility (R26.89);History of falling (Z91.81);Muscle weakness (generalized) (M62.81);Pain Pain - Right/Left: Left Pain - part of body: Hip     Time: 7989-2119 PT Time Calculation (min) (ACUTE ONLY): 33 min  Charges:  $Gait Training: 8-22 mins $Therapeutic Activity: 8-22 mins                    G Codes:       Earney Navy, PTA Pager: [phone]     [name] 08/21/2017, 1:50 PM

## 2017-08-21 NOTE — Social Work (Signed)
CSW discussed SNF offers with patient and daughter in law. Patient and family accepted SNF bed offer from [facility].  CSW will f/u on discharge summary. Pt will transition to SNF today.  Elissa Hefty, LCSW Clinical Social Worker [phone]

## 2017-08-21 NOTE — Social Work (Signed)
Clinical Social Worker facilitated patient discharge including contacting patient family and facility to confirm patient discharge plans.  Clinical information faxed to facility and family agreeable with plan.    CSW arranged ambulance transport via [facility] to [facility].    RN to call [phone] to give report prior to discharge.  Clinical Social Worker will sign off for now as social work intervention is no longer needed. Please consult [place] again if new need arises.  Elissa Hefty, LCSW Clinical Social Worker [phone]

## 2017-08-25 ENCOUNTER — Encounter (INDEPENDENT_AMBULATORY_CARE_PROVIDER_SITE_OTHER): Payer: Self-pay | Admitting: Orthopaedic Surgery

## 2017-08-25 ENCOUNTER — Ambulatory Visit (INDEPENDENT_AMBULATORY_CARE_PROVIDER_SITE_OTHER): Payer: Self-pay

## 2017-08-25 ENCOUNTER — Ambulatory Visit (INDEPENDENT_AMBULATORY_CARE_PROVIDER_SITE_OTHER): Payer: No Typology Code available for payment source | Admitting: Orthopaedic Surgery

## 2017-08-25 DIAGNOSIS — S72002D Fracture of unspecified part of neck of left femur, subsequent encounter for closed fracture with routine healing: Secondary | ICD-10-CM | POA: Diagnosis not present

## 2017-08-25 NOTE — Progress Notes (Signed)
Office Visit Note   Patient: Kristina [name]           Date of Birth: [birth year]           MRN: [id number] Visit Date: 08/25/2017              Requested by: [name], [place], [place], [place] [address] PCP: [name], MD   Assessment & Plan: Visit Diagnoses:  1. Closed fracture of left hip with routine healing, subsequent encounter     Plan: Impression is status post IM nail left femur.  At this point, I have instructed the physical therapist to work with the patient twice daily while she is at [facility].  She will follow-up with [place] in 1 week's time for suture removal.  Follow-Up Instructions: Return in about 1 week (around 09/01/2017).   Orders:  Orders Placed This Encounter  Procedures  . XR HIP UNILAT W OR W/O PELVIS 1V LEFT   No orders of the defined types were placed in this encounter.     Procedures: No procedures performed   Clinical Data: No additional findings.   Subjective: Chief Complaint  Patient presents with  . Left Hip - Routine Post Op    HPI Kristina [name] is a pleasant 84 year old female who presents our clinic today 7 days status post IM nail left hip 08/18/2017.  She has been in [facility] working with physical therapy.  She has been doing well despite the physical therapist not working with her daily.  She comes in today with her children in a wheelchair.  Prior to the fall, she was ambulating without a cane or a walker and living at home alone.  She would like to get back there.  In regards to her left hip, she admits to minimal pain.  She did notice today some swelling to the left lower extremity.  No calf pain.  No chest pain no shortness of breath.  No fevers no chills or any other systemic symptoms.  Review of Systems as detailed in HPI.  All others reviewed and are negative.   Objective: Vital Signs: There were no vitals taken for this visit.  Physical Exam well-developed well-nourished female in no  acute distress.  Alert and oriented x3.  Ortho Exam examination of the left hip reveals well-healing surgical incisions.  No evidence of infection.  She does have some swelling to the left lower extremity into the ankle.  No calf pain tenderness or swelling.  Negative Homans.  She is neurovascular intact distally.  Specialty Comments:  No specialty comments available.  Imaging: Xr Hip Unilat W Or W/o Pelvis 1v Left  Result Date: 08/25/2017 X-rays of the left hip reveal stable alignment of the fracture.    PMFS History: Patient Active Problem List   Diagnosis Date Noted  . Lt Hip fracture ([place]) 08/17/2017  . Fecal impaction in rectum ([place]) 08/17/2017  . Bilateral carotid bruits 07/18/2017  . Dizziness 12/09/2016  . Weakness 12/09/2016  . Malnutrition of moderate degree 12/03/2016  . Acute hyponatremia 12/01/2016  . Protein-calorie malnutrition ([place]) 12/01/2016  . Anemia 12/01/2016  . Pancreatic calcification 12/01/2016  . Abdominal pain, epigastric 12/01/2016  . Hyponatremia   . Claudication ([place]) 06/27/2016  . 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 Essential hypertension   . Hyperlipidemia with target low density lipoprotein (LDL) cholesterol less than 70 mg/dL   .  Chronic kidney disease (CKD), stage III (moderate) (HCC)   . AAA (abdominal aortic aneurysm) [facility])    Past Medical History:  Diagnosis Date  . AAA (abdominal aortic aneurysm) (HCC)    Mild - measuring 2.8x2.8cm  . Acute hyponatremia 11/2016  . 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 HOH (hard of hearing)   . Hyperlipidemia    Well-controlled  . Hypertension   . PAD (peripheral artery disease) (HCC)    LEA Dopplers: Right external iliac 40%, right mid SFA 70-99% with occlusion in Hunter's canal. Angiography. LEFT common  iliac 60%, left SFA 70-9%, popliteal 50%; RIGHT posterior tibial occluded;; carotid Dopplers in 2009: Less than 50% stenosis.  . Renal artery stenosis (HCC)     Family History  Problem Relation Age of Onset  . Heart attack Brother   . Cancer Brother        Bone cancer    Past Surgical History:  Procedure Laterality Date  . INTRAMEDULLARY (IM) NAIL INTERTROCHANTERIC Left 08/18/2017   Procedure: INTRAMEDULLARY (IM) NAIL INTERTROCHANTRIC;  Surgeon: Leandrew Koyanagi, MD;  Location: [facility];  Service: Orthopedics;  Laterality: Left;  . LOWER EXTREMITY ARTERIAL DOPPLER  05/30/2011   Right EIA->50% daimeter reduction, Rt SFA 70-99% diameter reduction, Rt SFA-occlusive disease at Hunter's canal, Rt PTA-appeared occluded, Lft CIA-moderate amount of calcific plaque w/ >60% diameter reduction, Lft prox SFA 70-99% diameter reduction, Lft POP-large amount of irregular mixed suggesting <50% diameter reduction  . NM MYOVIEW LTD  06/25/2008   No scintigraphic evidence of inducible myocardial ischemia  . PERIPHERAL VASCULAR ANGIOGRAM  07/13/2009   No intervention - "plan on doing staged intervention in near future"  . TRANSTHORACIC ECHOCARDIOGRAM  11/14/2007   EF >55%, mild mitral and tricuspid regurg.   Social History   Occupational History  . Not on file  Tobacco Use  . Smoking status: Former Smoker    Types: Cigarettes    Last attempt to quit: 07/11/2001    Years since quitting: 16.1  . Smokeless tobacco: Never Used  Substance and Sexual Activity  . Alcohol use: Yes    Alcohol/week: 0.6 oz    Types: 1 Glasses of wine per week  . Drug use: No  . Sexual activity: Not on file

## 2017-09-04 ENCOUNTER — Encounter (INDEPENDENT_AMBULATORY_CARE_PROVIDER_SITE_OTHER): Payer: Self-pay | Admitting: Orthopaedic Surgery

## 2017-09-04 ENCOUNTER — Ambulatory Visit (INDEPENDENT_AMBULATORY_CARE_PROVIDER_SITE_OTHER): Payer: BLUE CROSS/BLUE SHIELD | Admitting: Orthopaedic Surgery

## 2017-09-04 ENCOUNTER — Ambulatory Visit (INDEPENDENT_AMBULATORY_CARE_PROVIDER_SITE_OTHER): Payer: No Typology Code available for payment source

## 2017-09-04 DIAGNOSIS — S72002D Fracture of unspecified part of neck of left femur, subsequent encounter for closed fracture with routine healing: Secondary | ICD-10-CM

## 2017-09-04 NOTE — Progress Notes (Signed)
Post-Op Visit Note   Patient: Kristina [name]           Date of Birth: 10-28-1925           MRN: [id number] Visit Date: 09/04/2017 PCP: [name], MD   Assessment & Plan:  Chief Complaint:  Chief Complaint  Patient presents with  . Left Hip - Follow-up, Routine Post Op   Visit Diagnoses:  1. Closed fracture of left hip with routine healing, subsequent encounter     Plan: Allyna comes in for follow-up.  17 days status post IM nail left allergic fracture date of surgery 08/18/2017.  She has been at [facility] receiving physical therapy.  Doing well there.  Ambulating with a walker most of the time.  Minimal pain.  No fevers chills or any other systemic symptoms.  Examination of her left hip reveals a well-healing surgical incision without evidence of infection.  Calf soft nontender.  At this point we are going to have [place] continue working with therapy.  Stitches removed today.  She will follow-up with [place] in 4 weeks time for repeat evaluation and x-ray.  Follow-Up Instructions: Return in about 4 weeks (around 10/02/2017).   Orders:  Orders Placed This Encounter  Procedures  . XR HIP UNILAT W OR W/O PELVIS 2-3 VIEWS LEFT   No orders of the defined types were placed in this encounter.   Imaging: Xr Hip Unilat W Or W/o Pelvis 2-3 Views Left  Result Date: 09/04/2017 Imaging of the left hip reveals well aligned fracture and hardware.  The tip of the screw to the center of the femur is approximately 15 mm.  No collapse of the femoral head.   PMFS History: Patient Active Problem List   Diagnosis Date Noted  . Lt Hip fracture ([place]) 08/17/2017  . Fecal impaction in rectum ([place]) 08/17/2017  . Bilateral carotid bruits 07/18/2017  . Dizziness 12/09/2016  . Weakness 12/09/2016  . Malnutrition of moderate degree 12/03/2016  . Acute hyponatremia 12/01/2016  . Protein-calorie malnutrition ([place]) 12/01/2016  . Anemia 12/01/2016  . Pancreatic calcification 12/01/2016  .  Abdominal pain, epigastric 12/01/2016  . Hyponatremia   . Claudication ([place]) 06/27/2016  . 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 Essential hypertension   . Hyperlipidemia with target low density lipoprotein (LDL) cholesterol less than 70 mg/dL   . Chronic kidney disease (CKD), stage III (moderate) (HCC)   . AAA (abdominal aortic aneurysm) [facility])    Past Medical History:  Diagnosis Date  . AAA (abdominal aortic aneurysm) (HCC)    Mild - measuring 2.8x2.8cm  . Acute hyponatremia 11/2016  . 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 HOH (hard of hearing)   . Hyperlipidemia    Well-controlled  . Hypertension   . PAD (peripheral artery disease) (HCC)    LEA Dopplers: Right external iliac 40%, right mid SFA 70-99% with occlusion in Hunter's canal. Angiography. LEFT common iliac 60%, left SFA 70-9%, popliteal 50%; RIGHT posterior tibial occluded;; carotid Dopplers in 2009: Less than 50% stenosis.  . Renal artery stenosis (HCC)     Family History  Problem Relation Age of Onset  . Heart attack Brother   . Cancer Brother        Bone cancer    Past Surgical History:  Procedure Laterality Date  .  INTRAMEDULLARY (IM) NAIL INTERTROCHANTERIC Left 08/18/2017   Procedure: INTRAMEDULLARY (IM) NAIL INTERTROCHANTRIC;  Surgeon: Leandrew Koyanagi, MD;  Location: [facility];  Service: Orthopedics;  Laterality: Left;  . LOWER EXTREMITY ARTERIAL DOPPLER  05/30/2011   Right EIA->50% daimeter reduction, Rt SFA 70-99% diameter reduction, Rt SFA-occlusive disease at Hunter's canal, Rt PTA-appeared occluded, Lft CIA-moderate amount of calcific plaque w/ >60% diameter reduction, Lft prox SFA 70-99% diameter reduction, Lft POP-large amount of irregular mixed suggesting <50% diameter reduction  . NM MYOVIEW LTD  06/25/2008   No  scintigraphic evidence of inducible myocardial ischemia  . PERIPHERAL VASCULAR ANGIOGRAM  07/13/2009   No intervention - "plan on doing staged intervention in near future"  . TRANSTHORACIC ECHOCARDIOGRAM  11/14/2007   EF >55%, mild mitral and tricuspid regurg.   Social History   Occupational History  . Not on file  Tobacco Use  . Smoking status: Former Smoker    Types: Cigarettes    Last attempt to quit: 07/11/2001    Years since quitting: 16.1  . Smokeless tobacco: Never Used  Substance and Sexual Activity  . Alcohol use: Yes    Alcohol/week: 0.6 oz    Types: 1 Glasses of wine per week  . Drug use: No  . Sexual activity: Not on file

## 2017-09-08 ENCOUNTER — Ambulatory Visit: Payer: Medicare Other | Admitting: Cardiology

## 2017-09-08 NOTE — Progress Notes (Deleted)
PCP: [name], MD  Clinic Note: No chief complaint on file.   HPI: Kristina [name] is a 84 y.o. female with a PMH below who presents today for 2 month follow-up for her hypertension, PAD, AAA. Seen by Dr. Donzetta Matters from vascular surgery on May 18 as part of her evaluation given her known renal artery stenosis. -> Renal artery duplex ordered long mesentery duplex, however he did not feel that any invasive evaluation be necessary  Kristina [name] was last seen on 07/17/2017. She said that she was just aching all over. Stating that her arms and legs were constantly hurting. Mostly her left upper arm. She noted that she could barely walk because her muscles in her legs ache. No active cardiac symptoms.  Recent Hospitalizations:   She suffered a fall with left hip fracture requiring surgical repair in January 2019 (intramedullary implant). Hospitalization was complicated by fecal impaction and urinary retention.  Studies Personally Reviewed - (if available, images/films reviewed: From Epic Chart or Care Everywhere)  She had AAA, lower extremity and carotid Dopplers all ordered. Not done because of hospitalization.  Interval History: ***   No chest pain or shortness of breath with rest or exertion. No PND, orthopnea or edema. No palpitations, lightheadedness, dizziness, weakness or syncope/near syncope. No TIA/amaurosis fugax symptoms. No melena, hematochezia, hematuria, or epstaxis. No claudication.  ROS: A comprehensive was performed. ROS   I have reviewed and (if needed) personally updated the patient's problem list, medications, allergies, past medical and surgical history, social and family history.   Past Medical History:  Diagnosis Date  . AAA (abdominal aortic aneurysm) (HCC)    Mild - measuring 2.8x2.8cm  . Acute hyponatremia 11/2016  . 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 HOH  (hard of hearing)   . Hyperlipidemia    Well-controlled  . Hypertension   . PAD (peripheral artery disease) (HCC)    LEA Dopplers: Right external iliac 40%, right mid SFA 70-99% with occlusion in Hunter's canal. Angiography. LEFT common iliac 60%, left SFA 70-9%, popliteal 50%; RIGHT posterior tibial occluded;; carotid Dopplers in 2009: Less than 50% stenosis.  . Renal artery stenosis [facility])     Past Surgical History:  Procedure Laterality Date  . INTRAMEDULLARY (IM) NAIL INTERTROCHANTERIC Left 08/18/2017   Procedure: INTRAMEDULLARY (IM) NAIL INTERTROCHANTRIC;  Surgeon: Leandrew Koyanagi, MD;  Location: [facility];  Service: Orthopedics;  Laterality: Left;  . LOWER EXTREMITY ARTERIAL DOPPLER  05/30/2011   Right EIA->50% daimeter reduction, Rt SFA 70-99% diameter reduction, Rt SFA-occlusive disease at Hunter's canal, Rt PTA-appeared occluded, Lft CIA-moderate amount of calcific plaque w/ >60% diameter reduction, Lft prox SFA 70-99% diameter reduction, Lft POP-large amount of irregular mixed suggesting <50% diameter reduction  . NM MYOVIEW LTD  06/25/2008   No scintigraphic evidence of inducible myocardial ischemia  . PERIPHERAL VASCULAR ANGIOGRAM  07/13/2009   No intervention - "plan on doing staged intervention in near future"  . TRANSTHORACIC ECHOCARDIOGRAM  11/14/2007   EF >55%, mild mitral and tricuspid regurg.    No outpatient medications have been marked as taking for the 09/08/17 encounter (Appointment) with Leonie Man, MD.    Allergies  Allergen Reactions  . Lipitor [Atorvastatin] Other (See Comments)    Reaction:  Unknown   . Pletal [Cilostazol] Other (See Comments)    Reaction:  Unknown   . Simvastatin     MYALGIAS  Social History   Tobacco Use  . Smoking status: Former Smoker    Types: Cigarettes    Last attempt to quit: 07/11/2001    Years since quitting: 16.1  . Smokeless tobacco: Never Used  Substance Use Topics  . Alcohol use: Yes    Alcohol/week: 0.6 oz     Types: 1 Glasses of wine per week  . Drug use: No   Social History   Social History Narrative   Single mother of 2, grandmother 1. Tries to exercise when she can, but is limited by claudication.   Former smoker who quit in the early 2000s.   Drinks occasional glass of wine    family history includes Cancer in her brother; Heart attack in her brother.  Wt Readings from Last 3 Encounters:  08/18/17 80 lb (36.3 kg)  07/17/17 80 lb (36.3 kg)  01/27/17 80 lb (36.3 kg)    PHYSICAL EXAM There were no vitals taken for this visit. Physical Exam   Adult ECG Report  Rate: *** ;  Rhythm: {rhythm:17366};   Narrative Interpretation: ***   Other studies Reviewed: Additional studies/ records that were reviewed today include:  Recent Labs:  ***     ASSESSMENT / PLAN: Problem List Items Addressed This Visit    None      Current medicines are reviewed at length with the patient today. (+/- concerns) *** The following changes have been made: ***  There are no Patient Instructions on file for this visit.  Studies Ordered:   No orders of the defined types were placed in this encounter.     Glenetta Hew, M.D., M.S. Interventional Cardiologist   Pager # [phone] Phone # [phone] [address]. [place], [place] [address]   Thank you for choosing [facility] at [facility]!!

## 2017-09-13 ENCOUNTER — Telehealth (INDEPENDENT_AMBULATORY_CARE_PROVIDER_SITE_OTHER): Payer: Self-pay | Admitting: Radiology

## 2017-09-13 NOTE — Telephone Encounter (Signed)
Kiesha from [facility] called to notify Dr. [name], PT is planning to start services on  09/15/2016

## 2017-09-13 NOTE — Telephone Encounter (Signed)
error 

## 2017-09-14 ENCOUNTER — Telehealth (INDEPENDENT_AMBULATORY_CARE_PROVIDER_SITE_OTHER): Payer: Self-pay | Admitting: Orthopaedic Surgery

## 2017-09-14 NOTE — Telephone Encounter (Signed)
FYI

## 2017-09-14 NOTE — Telephone Encounter (Signed)
ok 

## 2017-09-14 NOTE — Telephone Encounter (Signed)
Tomasita Crumble, Nurse Manager with [facility] said she needed clarification on orders for this patient. Disease education management, med education management, foley catheter management, PT/OT/ST for eval and treat, and medical social worker for community resources. Her CB # [phone].

## 2017-09-14 NOTE — Telephone Encounter (Signed)
From ortho stand point, just PT/OT. Don't know about all that other stuff.

## 2017-09-14 NOTE — Telephone Encounter (Signed)
Please advise 

## 2017-09-15 NOTE — Telephone Encounter (Signed)
I have not called Midwife. Do you know who else she can get the other information from?

## 2017-09-15 NOTE — Telephone Encounter (Signed)
Called to let her know Okay for PT/OT eval and treat.

## 2017-09-18 ENCOUNTER — Telehealth (INDEPENDENT_AMBULATORY_CARE_PROVIDER_SITE_OTHER): Payer: Self-pay | Admitting: Orthopaedic Surgery

## 2017-09-18 ENCOUNTER — Telehealth (INDEPENDENT_AMBULATORY_CARE_PROVIDER_SITE_OTHER): Payer: Self-pay | Admitting: Physician Assistant

## 2017-09-18 NOTE — Telephone Encounter (Signed)
Okay to approve orders?

## 2017-09-18 NOTE — Telephone Encounter (Signed)
Kristina [name]  [facility]    Verbal Orders  Two week one  One week two   Ms.[facility] sent a faxed with the details pertaining to verbal orders along with a list of other orders. This information will go to lis per Tammy.

## 2017-09-18 NOTE — Telephone Encounter (Signed)
Received call from Tomasita Crumble with [facility] advised per note from Kathlee Nations orders were approved.

## 2017-09-18 NOTE — Telephone Encounter (Signed)
Yes, thank you.

## 2017-09-18 NOTE — Telephone Encounter (Signed)
Called [facility]  Per Mendel Ryder she approved orders.

## 2017-09-21 ENCOUNTER — Telehealth (INDEPENDENT_AMBULATORY_CARE_PROVIDER_SITE_OTHER): Payer: Self-pay | Admitting: Orthopaedic Surgery

## 2017-09-21 NOTE — Telephone Encounter (Signed)
Called Charice to advise on message

## 2017-09-21 NOTE — Telephone Encounter (Signed)
See message below °

## 2017-09-21 NOTE — Telephone Encounter (Signed)
Yes.  No restrictions

## 2017-09-21 NOTE — Telephone Encounter (Signed)
Charis Galanida-(PT) with [facility] called needing verbal orders for 1 wk 1 and 2 wk 4. She also needed to know what surgery the patient had and any precautions. The number to contact Lianne Bushy is [phone]

## 2017-10-02 ENCOUNTER — Ambulatory Visit (INDEPENDENT_AMBULATORY_CARE_PROVIDER_SITE_OTHER): Payer: BLUE CROSS/BLUE SHIELD | Admitting: Orthopaedic Surgery

## 2017-10-06 ENCOUNTER — Ambulatory Visit (INDEPENDENT_AMBULATORY_CARE_PROVIDER_SITE_OTHER): Payer: BLUE CROSS/BLUE SHIELD | Admitting: Orthopaedic Surgery

## 2017-10-06 ENCOUNTER — Encounter (INDEPENDENT_AMBULATORY_CARE_PROVIDER_SITE_OTHER): Payer: Self-pay | Admitting: Orthopaedic Surgery

## 2017-10-06 ENCOUNTER — Ambulatory Visit (INDEPENDENT_AMBULATORY_CARE_PROVIDER_SITE_OTHER): Payer: BLUE CROSS/BLUE SHIELD

## 2017-10-06 DIAGNOSIS — S72002D Fracture of unspecified part of neck of left femur, subsequent encounter for closed fracture with routine healing: Secondary | ICD-10-CM

## 2017-10-06 NOTE — Progress Notes (Signed)
Post-Op Visit Note   Patient: Kristina [name]           Date of Birth: 1925-09-12           MRN: [id number] Visit Date: 10/06/2017 PCP: [name], MD   Assessment & Plan:  Chief Complaint:  Chief Complaint  Patient presents with  . Left Hip - Follow-up   Visit Diagnoses:  1. Closed fracture of left hip with routine healing, subsequent encounter     Plan: Erikah comes in for follow-up.  49 days status post intramedullary nail left femur fracture, date of surgery 08/18/2017.  She is doing well with just occasional achiness.  She is ambulating with a walker and slowly starting to transition to a cane.  She has been getting home health physical therapy twice a week and is making great progress there.  Examination of the left hip reveals full motion, 3-1/2 out of 5 strength with resisted hip flexion.  Painless logroll.  She is neurovascular intact distally.  At this point, we will have her continue with home health physical therapy for the next 4 weeks.  She will follow-up with [place] in 6 weeks time for repeat evaluation and x-ray.  She will call with questions or concerns in the meantime.  Follow-Up Instructions: Return in about 6 weeks (around 11/17/2017).   Orders:  Orders Placed This Encounter  Procedures  . XR HIP UNILAT W OR W/O PELVIS 2-3 VIEWS LEFT   No orders of the defined types were placed in this encounter.   Imaging: Xr Hip Unilat W Or W/o Pelvis 2-3 Views Left  Result Date: 10/06/2017 X-rays of the left hip reveal well aligned hardware.  No collapse of femoral head.   PMFS History: Patient Active Problem List   Diagnosis Date Noted  . Lt Hip fracture ([place]) 08/17/2017  . Fecal impaction in rectum ([place]) 08/17/2017  . Bilateral carotid bruits 07/18/2017  . Dizziness 12/09/2016  . Weakness 12/09/2016  . Malnutrition of moderate degree 12/03/2016  . Acute hyponatremia 12/01/2016  . Protein-calorie malnutrition ([place]) 12/01/2016  . Anemia 12/01/2016  .  Pancreatic calcification 12/01/2016  . Abdominal pain, epigastric 12/01/2016  . Hyponatremia   . Claudication ([place]) 06/27/2016  . 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 Essential hypertension   . Hyperlipidemia with target low density lipoprotein (LDL) cholesterol less than 70 mg/dL   . Chronic kidney disease (CKD), stage III (moderate) (HCC)   . AAA (abdominal aortic aneurysm) [facility])    Past Medical History:  Diagnosis Date  . AAA (abdominal aortic aneurysm) (HCC)    Mild - measuring 2.8x2.8cm  . Acute hyponatremia 11/2016  . 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 HOH (hard of hearing)   . Hyperlipidemia    Well-controlled  . Hypertension   . PAD (peripheral artery disease) (HCC)    LEA Dopplers: Right external iliac 40%, right mid SFA 70-99% with occlusion in Hunter's canal. Angiography. LEFT common iliac 60%, left SFA 70-9%, popliteal 50%; RIGHT posterior tibial occluded;; carotid Dopplers in 2009: Less than 50% stenosis.  . Renal artery stenosis (HCC)     Family History  Problem Relation Age of Onset  . Heart attack Brother   . Cancer Brother        Bone cancer    Past Surgical  History:  Procedure Laterality Date  . INTRAMEDULLARY (IM) NAIL INTERTROCHANTERIC Left 08/18/2017   Procedure: INTRAMEDULLARY (IM) NAIL INTERTROCHANTRIC;  Surgeon: Leandrew Koyanagi, MD;  Location: [facility];  Service: Orthopedics;  Laterality: Left;  . LOWER EXTREMITY ARTERIAL DOPPLER  05/30/2011   Right EIA->50% daimeter reduction, Rt SFA 70-99% diameter reduction, Rt SFA-occlusive disease at Hunter's canal, Rt PTA-appeared occluded, Lft CIA-moderate amount of calcific plaque w/ >60% diameter reduction, Lft prox SFA 70-99% diameter reduction, Lft POP-large amount of irregular mixed suggesting <50% diameter reduction  .  NM MYOVIEW LTD  06/25/2008   No scintigraphic evidence of inducible myocardial ischemia  . PERIPHERAL VASCULAR ANGIOGRAM  07/13/2009   No intervention - "plan on doing staged intervention in near future"  . TRANSTHORACIC ECHOCARDIOGRAM  11/14/2007   EF >55%, mild mitral and tricuspid regurg.   Social History   Occupational History  . Not on file  Tobacco Use  . Smoking status: Former Smoker    Types: Cigarettes    Last attempt to quit: 07/11/2001    Years since quitting: 16.2  . Smokeless tobacco: Never Used  Substance and Sexual Activity  . Alcohol use: Yes    Alcohol/week: 0.6 oz    Types: 1 Glasses of wine per week  . Drug use: No  . Sexual activity: Not on file

## 2017-10-20 ENCOUNTER — Telehealth (INDEPENDENT_AMBULATORY_CARE_PROVIDER_SITE_OTHER): Payer: Self-pay | Admitting: Orthopaedic Surgery

## 2017-10-20 NOTE — Telephone Encounter (Signed)
Please advise. Please send response back to [place].

## 2017-10-20 NOTE — Telephone Encounter (Signed)
Charis from [facility] called requesting VO for PT for 2 times a week for 3 weeks.  CB#[phone]

## 2017-10-20 NOTE — Telephone Encounter (Signed)
yes

## 2017-10-23 NOTE — Telephone Encounter (Signed)
Please see below.

## 2017-10-23 NOTE — Telephone Encounter (Signed)
Called her back to advise

## 2017-11-27 ENCOUNTER — Ambulatory Visit (INDEPENDENT_AMBULATORY_CARE_PROVIDER_SITE_OTHER): Payer: Medicare Other

## 2017-11-27 ENCOUNTER — Ambulatory Visit (INDEPENDENT_AMBULATORY_CARE_PROVIDER_SITE_OTHER): Payer: Medicare Other | Admitting: Orthopaedic Surgery

## 2017-11-27 ENCOUNTER — Encounter (INDEPENDENT_AMBULATORY_CARE_PROVIDER_SITE_OTHER): Payer: Self-pay | Admitting: Orthopaedic Surgery

## 2017-11-27 DIAGNOSIS — S72002D Fracture of unspecified part of neck of left femur, subsequent encounter for closed fracture with routine healing: Secondary | ICD-10-CM

## 2017-11-27 NOTE — Progress Notes (Signed)
Office Visit Note   Patient: Kristina [name]           Date of Birth: September 06, 1925           MRN: [id number] Visit Date: 11/27/2017              Requested by: [name], [address], [place], [place] [address] PCP: [name], MD   Assessment & Plan: Visit Diagnoses:  1. Closed fracture of left hip with routine healing, subsequent encounter     Plan: At this point patient has demonstrated radiographic healing of her fracture.  She is progressing as expected.  She is doing home exercise.  Ambulating with a walker.  At this point I will release her to follow-up as needed.  Questions encouraged and answered.  Follow-Up Instructions: Return if symptoms worsen or fail to improve.   Orders:  Orders Placed This Encounter  Procedures  . XR HIP UNILAT W OR W/O PELVIS 2-3 VIEWS LEFT   No orders of the defined types were placed in this encounter.     Procedures: No procedures performed   Clinical Data: No additional findings.   Subjective: Chief Complaint  Patient presents with  . Left Hip - Pain    Patient is 101 days status post intramedullary fixation left intertrochanteric hip fracture.  She is overall doing well.  She is ambulating with a cane.  Denies any pain.   Review of Systems   Objective: Vital Signs: There were no vitals taken for this visit.  Physical Exam  Ortho Exam Surgical scars are audible.  Painless rotation of the hip. Specialty Comments:  No specialty comments available.  Imaging: Xr Hip Unilat W Or W/o Pelvis 2-3 Views Left  Result Date: 11/27/2017 Healed intertrochanteric hip fracture.  Stable fixation.    PMFS History: Patient Active Problem List   Diagnosis Date Noted  . Lt Hip fracture ([place]) 08/17/2017  . Fecal impaction in rectum ([place]) 08/17/2017  . Bilateral carotid bruits 07/18/2017  . Dizziness 12/09/2016  . Weakness 12/09/2016  . Malnutrition of moderate degree 12/03/2016  . Acute  hyponatremia 12/01/2016  . Protein-calorie malnutrition ([place]) 12/01/2016  . Anemia 12/01/2016  . Pancreatic calcification 12/01/2016  . Abdominal pain, epigastric 12/01/2016  . Hyponatremia   . Claudication ([place]) 06/27/2016  . 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 Essential hypertension   . Hyperlipidemia with target low density lipoprotein (LDL) cholesterol less than 70 mg/dL   . Chronic kidney disease (CKD), stage III (moderate) (HCC)   . AAA (abdominal aortic aneurysm) [facility])    Past Medical History:  Diagnosis Date  . AAA (abdominal aortic aneurysm) (HCC)    Mild - measuring 2.8x2.8cm  . Acute hyponatremia 11/2016  . 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 HOH (hard of hearing)   . Hyperlipidemia    Well-controlled  . Hypertension   . PAD (peripheral artery disease) (HCC)    LEA Dopplers: Right external iliac 40%, right mid SFA 70-99% with occlusion in Hunter's canal. Angiography. LEFT common iliac 60%, left SFA 70-9%, popliteal 50%; RIGHT posterior tibial occluded;; carotid Dopplers in 2009: Less than 50% stenosis.  . Renal artery stenosis (HCC)     Family History  Problem Relation Age of Onset  . Heart attack Brother   .  Cancer Brother        Bone cancer    Past Surgical History:  Procedure Laterality Date  . INTRAMEDULLARY (IM) NAIL INTERTROCHANTERIC Left 08/18/2017   Procedure: INTRAMEDULLARY (IM) NAIL INTERTROCHANTRIC;  Surgeon: Leandrew Koyanagi, MD;  Location: [facility];  Service: Orthopedics;  Laterality: Left;  . LOWER EXTREMITY ARTERIAL DOPPLER  05/30/2011   Right EIA->50% daimeter reduction, Rt SFA 70-99% diameter reduction, Rt SFA-occlusive disease at Hunter's canal, Rt PTA-appeared occluded, Lft CIA-moderate amount of calcific plaque w/ >60% diameter reduction, Lft prox SFA 70-99%  diameter reduction, Lft POP-large amount of irregular mixed suggesting <50% diameter reduction  . NM MYOVIEW LTD  06/25/2008   No scintigraphic evidence of inducible myocardial ischemia  . PERIPHERAL VASCULAR ANGIOGRAM  07/13/2009   No intervention - "plan on doing staged intervention in near future"  . TRANSTHORACIC ECHOCARDIOGRAM  11/14/2007   EF >55%, mild mitral and tricuspid regurg.   Social History   Occupational History  . Not on file  Tobacco Use  . Smoking status: Former Smoker    Types: Cigarettes    Last attempt to quit: 07/11/2001    Years since quitting: 16.3  . Smokeless tobacco: Never Used  Substance and Sexual Activity  . Alcohol use: Yes    Alcohol/week: 0.6 oz    Types: 1 Glasses of wine per week  . Drug use: No  . Sexual activity: Not on file

## 2017-12-27 ENCOUNTER — Encounter: Payer: Self-pay | Admitting: *Deleted

## 2017-12-28 ENCOUNTER — Telehealth: Payer: Self-pay | Admitting: Cardiology

## 2017-12-28 NOTE — Telephone Encounter (Signed)
New Message: ° ° ° ° ° °Pt is returning a call for lab results. °

## 2017-12-28 NOTE — Telephone Encounter (Signed)
Spoke with Pt and advised of lab results. Asked Pt how she is feeling since she has stopped taking simvastatin. Pt states she feel and had to have hip surgery and hasn't been feeling well since. She reports feeling tired.  Routing to Dr. Ellyn Hack     Result Notes for Lab report - scanned   Notes recorded by Raiford Simmonds, RN on 12/27/2017 at 11:36 AM EDT MAILED LETTER WITH RESULTS ------  Notes recorded by Raiford Simmonds, RN on 12/27/2017 at 11:29 AM EDT LEFT MESSAGE ON HOME ANSWER MACHINE TO CALL BACK IN REGARDS TO LAB RESULTS ------  Notes recorded by Leonie Man, MD on 11/20/2017 at 7:57 PM EDT Recent Labs: 10/23/2017 Na+ 135, K+ 4.2, Cl- 97, HCO3- 30 , BUN 26, Cr 1.2, Glu 92, Ca2+ 10.07; CBC: W 7.6, H/H 11.5/35.1, Plt 484 TC 213, TG 79, HDL 71, LDL 126  Clearly without having the simvastatin, lipids have gotten worse. At age 85, I think we need to see how she is feeling off of the simvastatin, if feeling better, would simply stay off it.  Glenetta Hew, MD

## 2017-12-28 NOTE — Telephone Encounter (Signed)
Just stay off statin for now.  Kristina [name]

## 2017-12-29 NOTE — Telephone Encounter (Signed)
Spoke with pt and advised of Dr. Allison Quarry recommendation. Verbalized understanding.

## 2019-02-28 ENCOUNTER — Other Ambulatory Visit: Payer: Self-pay

## 2019-09-12 ENCOUNTER — Encounter: Payer: Self-pay | Admitting: Cardiology

## 2019-12-09 ENCOUNTER — Other Ambulatory Visit: Payer: Self-pay

## 2019-12-09 ENCOUNTER — Emergency Department (HOSPITAL_BASED_OUTPATIENT_CLINIC_OR_DEPARTMENT_OTHER): Payer: Medicare Other

## 2019-12-09 ENCOUNTER — Encounter (HOSPITAL_COMMUNITY): Payer: Self-pay

## 2019-12-09 ENCOUNTER — Emergency Department (HOSPITAL_COMMUNITY): Payer: Medicare Other

## 2019-12-09 ENCOUNTER — Emergency Department (HOSPITAL_COMMUNITY)
Admission: EM | Admit: 2019-12-09 | Discharge: 2019-12-09 | Disposition: A | Discharge status: Home / Self Care | Payer: Medicare Other | Attending: Emergency Medicine | Admitting: Emergency Medicine

## 2019-12-09 DIAGNOSIS — R0789 Other chest pain: Secondary | ICD-10-CM | POA: Insufficient documentation

## 2019-12-09 DIAGNOSIS — Z87891 Personal history of nicotine dependence: Secondary | ICD-10-CM | POA: Diagnosis not present

## 2019-12-09 DIAGNOSIS — Z7982 Long term (current) use of aspirin: Secondary | ICD-10-CM | POA: Insufficient documentation

## 2019-12-09 DIAGNOSIS — R2241 Localized swelling, mass and lump, right lower limb: Secondary | ICD-10-CM | POA: Diagnosis not present

## 2019-12-09 DIAGNOSIS — I129 Hypertensive chronic kidney disease with stage 1 through stage 4 chronic kidney disease, or unspecified chronic kidney disease: Secondary | ICD-10-CM | POA: Diagnosis not present

## 2019-12-09 DIAGNOSIS — R0602 Shortness of breath: Secondary | ICD-10-CM | POA: Diagnosis present

## 2019-12-09 DIAGNOSIS — M79609 Pain in unspecified limb: Secondary | ICD-10-CM

## 2019-12-09 DIAGNOSIS — Z79899 Other long term (current) drug therapy: Secondary | ICD-10-CM | POA: Diagnosis not present

## 2019-12-09 DIAGNOSIS — M7989 Other specified soft tissue disorders: Secondary | ICD-10-CM

## 2019-12-09 DIAGNOSIS — R109 Unspecified abdominal pain: Secondary | ICD-10-CM | POA: Diagnosis not present

## 2019-12-09 DIAGNOSIS — N183 Chronic kidney disease, stage 3 unspecified: Secondary | ICD-10-CM | POA: Diagnosis not present

## 2019-12-09 DIAGNOSIS — M1711 Unilateral primary osteoarthritis, right knee: Secondary | ICD-10-CM

## 2019-12-09 LAB — CBC
HCT: 36.2 % (ref 36.0–46.0)
Hemoglobin: 11.5 g/dL — ABNORMAL LOW (ref 12.0–15.0)
MCH: 31.6 pg (ref 26.0–34.0)
MCHC: 31.8 g/dL (ref 30.0–36.0)
MCV: 99.5 fL (ref 80.0–100.0)
Platelets: 404 10*3/uL — ABNORMAL HIGH (ref 150–400)
RBC: 3.64 MIL/uL — ABNORMAL LOW (ref 3.87–5.11)
RDW: 12.9 % (ref 11.5–15.5)
WBC: 7.8 10*3/uL (ref 4.0–10.5)
nRBC: 0 % (ref 0.0–0.2)

## 2019-12-09 LAB — BASIC METABOLIC PANEL
Anion gap: 13 (ref 5–15)
BUN: 30 mg/dL — ABNORMAL HIGH (ref 8–23)
CO2: 26 mmol/L (ref 22–32)
Calcium: 9.8 mg/dL (ref 8.9–10.3)
Chloride: 98 mmol/L (ref 98–111)
Creatinine, Ser: 1.21 mg/dL — ABNORMAL HIGH (ref 0.44–1.00)
GFR calc Af Amer: 45 mL/min — ABNORMAL LOW (ref >60)
GFR calc non Af Amer: 39 mL/min — ABNORMAL LOW (ref >60)
Glucose, Bld: 94 mg/dL (ref 70–99)
Potassium: 3.9 mmol/L (ref 3.5–5.1)
Sodium: 137 mmol/L (ref 135–145)

## 2019-12-09 LAB — TROPONIN I (HIGH SENSITIVITY)
Troponin I (High Sensitivity): 9 ng/L (ref <18)
Troponin I (High Sensitivity): 11 ng/L (ref <18)

## 2019-12-09 LAB — BRAIN NATRIURETIC PEPTIDE: B Natriuretic Peptide: 104.3 pg/mL — ABNORMAL HIGH (ref 0.0–100.0)

## 2019-12-09 MED ORDER — IOHEXOL 350 MG/ML SOLN
100.0000 mL | Freq: Once | INTRAVENOUS | Status: AC | PRN
  Administered 2019-12-09: 100 mL via INTRAVENOUS

## 2019-12-09 MED ORDER — SODIUM CHLORIDE (PF) 0.9 % IJ SOLN
INTRAMUSCULAR | Status: AC
  Filled 2019-12-09: qty 50

## 2019-12-09 NOTE — ED Triage Notes (Signed)
Patient c/o SOB x 1 year. Patient states progressively worse in the past month and today was the worse. Patient also c/o bilateral ankle swelling and swelling of the left knee x 2 weeks. Patient is HOH.

## 2019-12-09 NOTE — ED Provider Notes (Signed)
Kristina [name] DEPT Provider Note   CSN: [id number] Arrival date & time: 12/09/19  1232     History Chief Complaint  Patient presents with  . Shortness of Breath  . Joint Swelling  . Knee Pain    Kristina [name] is a 86 y.o. female with a past medical history of CKD, hypertension, PAD, presenting to the ED with a chief complaint of shortness of breath, right knee swelling.  Regarding her shortness of breath, she reports intermittent symptoms for the past year that has gotten worse in the past month.  Reports associated intermittent chest pain as well.  Also reports generalized abdominal pain for the past few weeks. She denies any specific aggravating or alleviating factor to her chest pain or shortness of breath.  She denies chest pain at rest.  She noticed about 3 weeks ago started having right knee swelling.  She denies any injuries or falls that would have caused this.  She reports distant history of DVT in the past, not currently on anticoagulation.  She denies any numbness in arms or legs, vision changes, vomiting, cough, fever.  HPI     Past Medical History:  Diagnosis Date  . AAA (abdominal aortic aneurysm) (HCC)    Mild - measuring 2.8x2.8cm  . Acute hyponatremia 11/2016  . 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 HOH (hard of hearing)   . Hyperlipidemia    Well-controlled  . Hypertension   . PAD (peripheral artery disease) (HCC)    LEA Dopplers: Right external iliac 40%, right mid SFA 70-99% with occlusion in Hunter's canal. Angiography. LEFT common iliac 60%, left SFA 70-9%, popliteal 50%; RIGHT posterior tibial occluded;; carotid Dopplers in 2009: Less than 50% stenosis.  . Renal artery stenosis [facility])     Patient Active Problem List   Diagnosis Date Noted  . Lt Hip fracture ([place]) 08/17/2017  . Fecal impaction in rectum ([place]) 08/17/2017  . Bilateral carotid  bruits 07/18/2017  . Dizziness 12/09/2016  . Weakness 12/09/2016  . Malnutrition of moderate degree 12/03/2016  . Acute hyponatremia 12/01/2016  . Protein-calorie malnutrition ([place]) 12/01/2016  . Anemia 12/01/2016  . Pancreatic calcification 12/01/2016  . Abdominal pain, epigastric 12/01/2016  . Hyponatremia   . Claudication ([place]) 06/27/2016  . 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 Essential hypertension   . Hyperlipidemia with target low density lipoprotein (LDL) cholesterol less than 70 mg/dL   . Chronic kidney disease (CKD), stage III (moderate) (HCC)   . AAA (abdominal aortic aneurysm) [facility])     Past Surgical History:  Procedure Laterality Date  . INTRAMEDULLARY (IM) NAIL INTERTROCHANTERIC Left 08/18/2017   Procedure: INTRAMEDULLARY (IM) NAIL INTERTROCHANTRIC;  Surgeon: Leandrew Koyanagi, MD;  Location: [facility];  Service: Orthopedics;  Laterality: Left;  . LOWER EXTREMITY ARTERIAL DOPPLER  05/30/2011   Right EIA->50% daimeter reduction, Rt SFA 70-99% diameter reduction, Rt SFA-occlusive disease at Hunter's canal, Rt PTA-appeared occluded, Lft CIA-moderate amount of calcific plaque w/ >60% diameter reduction, Lft prox SFA 70-99% diameter reduction, Lft POP-large amount of irregular mixed suggesting <50% diameter reduction  . NM MYOVIEW LTD  06/25/2008   No scintigraphic evidence of inducible myocardial ischemia  . PERIPHERAL VASCULAR ANGIOGRAM  07/13/2009   No intervention - "plan on doing staged intervention in near future"  . TRANSTHORACIC ECHOCARDIOGRAM  11/14/2007   EF >55%, mild mitral and tricuspid regurg.     OB History   No obstetric history on file.     Family History  Problem Relation Age of Onset  . Heart attack Brother   . Cancer Brother        Bone cancer    Social History   Tobacco Use  . Smoking status: Former Smoker    Types: Cigarettes    Quit date:  07/11/2001    Years since quitting: 18.4  . Smokeless tobacco: Never Used  Substance Use Topics  . Alcohol use: Yes    Alcohol/week: 1.0 standard drinks    Types: 1 Glasses of wine per week  . Drug use: No    Home Medications Prior to Admission medications   Medication Sig Start Date End Date Taking? Authorizing Provider  aspirin 81 MG tablet Take 81 mg by mouth daily.    [provider]  Coenzyme Q10 (COQ10) 100 MG CAPS Take 100 mg by mouth 3 (three) times daily with meals. 07/17/17   Leonie Man, MD  Docusate Sodium (COLACE PO) Take 1 tablet by mouth daily.    [provider]  enoxaparin (LOVENOX) 40 MG/0.4ML injection Inject 0.4 mLs (40 mg total) into the skin daily. 08/18/17   Leandrew Koyanagi, MD  feeding supplement, ENSURE ENLIVE, (ENSURE ENLIVE) LIQD Take 237 mLs by mouth 2 (two) times daily between meals. 08/21/17   Raiford Noble Latif, DO  hydrALAZINE (APRESOLINE) 50 MG tablet Take 50 mg by mouth 2 (two) times daily.    [provider]  HYDROcodone-acetaminophen (NORCO) 7.5-325 MG tablet Take 1-2 tablets by mouth every 6 (six) hours as needed for moderate pain. 08/18/17   Leandrew Koyanagi, MD  pantoprazole (PROTONIX) 40 MG tablet Take 1 tablet (40 mg total) by mouth daily. 08/22/17   Sheikh, Georgina Quint Latif, DO  polyethylene glycol [facility] / GLYCOLAX) packet Take 17 g by mouth daily.    [provider]  Probiotic Product (ALIGN) 4 MG CAPS Take 4 mg by mouth at bedtime.     [provider]    Allergies    Lipitor [atorvastatin], Pletal [cilostazol], and Simvastatin  Review of Systems   Review of Systems  Constitutional: Negative for appetite change, chills and fever.  HENT: Negative for ear pain, rhinorrhea, sneezing and sore throat.   Eyes: Negative for photophobia and visual disturbance.  Respiratory: Positive for shortness of breath. Negative for cough, chest tightness and wheezing.   Cardiovascular: Positive for chest pain. Negative  for palpitations.  Gastrointestinal: Negative for abdominal pain, blood in stool, constipation, diarrhea, nausea and vomiting.  Genitourinary: Negative for dysuria, hematuria and urgency.  Musculoskeletal: Positive for joint swelling. Negative for myalgias.  Skin: Negative for rash.  Neurological: Negative for dizziness, weakness and light-headedness.    Physical Exam Updated Vital Signs BP (!) 184/81   Pulse 80   Temp 97.8 F (36.6 C) (Oral)   Resp (!) 26   Ht 4\' 10"  (1.473 m)   Wt 36.3 kg   SpO2 97%   BMI 16.72 kg/m   Physical Exam Vitals and nursing note reviewed.  Constitutional:      General: She is not in acute distress.    Appearance: She is well-developed.  HENT:     Head: Normocephalic and atraumatic.     Nose: Nose normal.  Eyes:     General: No scleral icterus.       Left eye: No discharge.  Conjunctiva/sclera: Conjunctivae normal.  Cardiovascular:     Rate and Rhythm: Normal rate and regular rhythm.     Heart sounds: Normal heart sounds. No murmur. No friction rub. No gallop.   Pulmonary:     Effort: Pulmonary effort is normal. No respiratory distress.     Breath sounds: Normal breath sounds.  Abdominal:     General: Bowel sounds are normal. There is no distension.     Palpations: Abdomen is soft.     Tenderness: There is abdominal tenderness (generalized). There is no guarding.  Musculoskeletal:        General: Normal range of motion.     Cervical back: Normal range of motion and neck supple.     Comments: TTP and swelling of the R eye with pain with ROM. No changes to sensation noted. 2+ DP pulses noted bilaterally.  Skin:    General: Skin is warm and dry.     Findings: No rash.  Neurological:     Mental Status: She is alert.     Motor: No abnormal muscle tone.     Coordination: Coordination normal.     ED Results / Procedures / Treatments   Labs (all labs ordered are listed, but only abnormal results are displayed) Labs Reviewed  CBC -  Abnormal; Notable for the following components:      Result Value   RBC 3.64 (*)    Hemoglobin 11.5 (*)    Platelets 404 (*)    All other components within normal limits  BRAIN NATRIURETIC PEPTIDE - Abnormal; Notable for the following components:   B Natriuretic Peptide 104.3 (*)    All other components within normal limits  BASIC METABOLIC PANEL - Abnormal; Notable for the following components:   BUN 30 (*)    Creatinine, Ser 1.21 (*)    GFR calc non Af Amer 39 (*)    GFR calc Af Amer 45 (*)    All other components within normal limits  TROPONIN I (HIGH SENSITIVITY)  TROPONIN I (HIGH SENSITIVITY)    EKG EKG Interpretation  Date/Time:  Monday Dec 09 2019 17:19:36 EDT Ventricular Rate:  78 PR Interval:    QRS Duration: 80 QT Interval:  377 QTC Calculation: 430 R Axis:   -29 Text Interpretation: Sinus rhythm Left ventricular hypertrophy No significant change since last tracing Confirmed by Deno Etienne [phone]) on 12/09/2019 5:21:58 PM   Radiology DG Chest 2 View  Result Date: 12/09/2019 CLINICAL DATA:  Shortness of breath. Additional history provided: Patient reports shortness of breath for 1 year, progressively worsening in the past month and today even worse, bilateral ankle swelling and swelling of the left knee for 2 weeks. EXAM: CHEST - 2 VIEW COMPARISON:  Chest radiograph 08/21/2017 FINDINGS: Heart size at the upper limits of normal, unchanged. Aortic atherosclerosis. No appreciable airspace consolidation within the lungs. No pulmonary edema. No evidence of pleural effusion or pneumothorax. No acute bony abnormality. IMPRESSION: No evidence of acute cardiopulmonary abnormality. Heart size at the upper limits of normal, unchanged. Aortic Atherosclerosis (ICD10-I70.0). Electronically Signed   By: Kellie Simmering DO   On: 12/09/2019 13:13   DG Knee Complete 4 Views Right  Result Date: 12/09/2019 CLINICAL DATA:  Knee swelling EXAM: RIGHT KNEE - COMPLETE 4+ VIEW COMPARISON:  None  FINDINGS: Osteopenia. Tricompartmental osteoarthritic changes, moderate to marked with joint space narrowing and marginal osteophytes as well as chondrocalcinosis. Vascular calcification in soft tissues. Small joint effusion. Mild cortical thickening/irregularity along the lateral margin of the  distal femoral metaphyseal cortex seen on the AP view. IMPRESSION: Tricompartmental osteoarthritic changes with small joint effusion. Thickening of the lateral femoral cortex seen on the AP view. Findings could represent early stress fracture. Bone scan or MRI may be helpful on follow-up for further assessment. No displaced fracture is identified. Electronically Signed   By: Zetta Bills M.D.   On: 12/09/2019 18:22   VAS [place] LOWER EXTREMITY VENOUS (DVT) (ONLY [facility] & [facility] 7a-7p)  Result Date: 12/09/2019  Lower Venous DVTStudy Indications: Pain, and Swelling X 2 weeks.  Comparison Study: No prior study on file Performing Technologist: Sharion Dove RVS  Examination Guidelines: A complete evaluation includes B-mode imaging, spectral Doppler, color Doppler, and power Doppler as needed of all accessible portions of each vessel. Bilateral testing is considered an integral part of a complete examination. Limited examinations for reoccurring indications may be performed as noted. The reflux portion of the exam is performed with the patient in reverse Trendelenburg.  +---------+---------------+---------+-----------+----------+--------------+ RIGHT    CompressibilityPhasicitySpontaneityPropertiesThrombus Aging +---------+---------------+---------+-----------+----------+--------------+ CFV      Full           Yes      Yes                                 +---------+---------------+---------+-----------+----------+--------------+ SFJ      Full                                                        +---------+---------------+---------+-----------+----------+--------------+ FV Prox  Full                                                         +---------+---------------+---------+-----------+----------+--------------+ FV Mid   Full                                                        +---------+---------------+---------+-----------+----------+--------------+ FV DistalFull                                                        +---------+---------------+---------+-----------+----------+--------------+ PFV      Full                                                        +---------+---------------+---------+-----------+----------+--------------+ POP      Full           Yes      Yes                                 +---------+---------------+---------+-----------+----------+--------------+ PTV  Full                                                        +---------+---------------+---------+-----------+----------+--------------+ PERO     Full                                                        +---------+---------------+---------+-----------+----------+--------------+   +----+---------------+---------+-----------+----------+--------------+ LEFTCompressibilityPhasicitySpontaneityPropertiesThrombus Aging +----+---------------+---------+-----------+----------+--------------+ CFV Full           Yes      Yes                                 +----+---------------+---------+-----------+----------+--------------+     Summary: RIGHT: - There is no evidence of deep vein thrombosis in the lower extremity.  LEFT: - No evidence of common femoral vein obstruction.  *See table(s) above for measurements and observations.    Preliminary     Procedures Procedures (including critical care time)  Medications Ordered in ED Medications  sodium chloride (PF) 0.9 % injection (  Given 12/09/19 2026)  iohexol (OMNIPAQUE) 350 MG/ML injection 100 mL (100 mLs Intravenous Contrast Given 12/09/19 2004)    ED Course  I have reviewed the triage vital signs and the nursing  notes.  Pertinent labs & imaging results that were available during my care of the patient were reviewed by me and considered in my medical decision making (see chart for details).    MDM Rules/Calculators/A&P                      86yo F with a past medical history of CKD, HTN, PAD presenting to the ED with a chief complaint of shortness of breath, right knee swelling and abdominal pain.  Her symptoms have been going on for the past several months.  She reports worsening right knee pain for the past 3 weeks.  Reports swelling as well.  Denies any injury.  Reports history of DVT in the past not currently on anticoagulation.  On exam abdomen is generally tender without rebound or guarding.  Oxygen saturations above 95% on room air at rest.  Denies chest pain to me currently.  EKG without any acute ischemic changes or changes from prior tracings.  Chest x-ray is unremarkable.  Ultrasound of the leg is negative for DVT on the right side.  Knee x-ray shows osteoarthritic changes.  BNP is 100.  CBC, BMP with creatinine of 1.2 which is similar to baseline. CT of the chest and abdomen pending.  No evidence of PE or other abnormalities, will ambulate while measuring pulse oximetry to ensure for no hypoxia.  If work-up is reassuring, consider discharge home with PCP follow-up. Care handed off to oncoming team pending recheck and disposition.    Portions of this note were generated with [employer]. Dictation errors may occur despite best attempts at proofreading.   Final Clinical Impression(s) / ED Diagnoses Final diagnoses:  Chest wall pain  Primary osteoarthritis of right knee    Rx / DC Orders ED Discharge Orders    None       Saman Umstead,  PA-C 12/09/19 2114    Deno Etienne, DO 12/09/19 2153

## 2019-12-09 NOTE — Discharge Instructions (Signed)
You have an enlargement of your aorta at the radiologist is recommending a repeat CT or MRI at least in the next year. You also have a enlarged aorta in your pelvis that they recommend doing an ultrasound in about 2 years. Please discuss this with your family doctor as they will likely schedule it for you. Please return to the emergency department for worsening chest pain or trouble breathing or if you pass out.

## 2019-12-09 NOTE — Progress Notes (Signed)
VASCULAR LAB PRELIMINARY  PRELIMINARY  PRELIMINARY  PRELIMINARY  Right lower extremity venous duplex completed.    Preliminary report:  See CV proc for preliminary results.  Gave Hina Khatri, PA-C, report.  Thuy Atilano, RVT 12/09/2019, 7:01 PM

## 2020-02-08 ENCOUNTER — Emergency Department (HOSPITAL_COMMUNITY): Payer: Medicare Other

## 2020-02-08 ENCOUNTER — Other Ambulatory Visit: Payer: Self-pay

## 2020-02-08 ENCOUNTER — Encounter (HOSPITAL_COMMUNITY): Payer: Self-pay | Admitting: Emergency Medicine

## 2020-02-08 ENCOUNTER — Emergency Department (HOSPITAL_COMMUNITY)
Admission: EM | Admit: 2020-02-08 | Discharge: 2020-02-08 | Disposition: A | Discharge status: Home / Self Care | Payer: Medicare Other | Attending: Emergency Medicine | Admitting: Emergency Medicine

## 2020-02-08 DIAGNOSIS — Z87891 Personal history of nicotine dependence: Secondary | ICD-10-CM | POA: Diagnosis not present

## 2020-02-08 DIAGNOSIS — Z79899 Other long term (current) drug therapy: Secondary | ICD-10-CM | POA: Diagnosis not present

## 2020-02-08 DIAGNOSIS — N183 Chronic kidney disease, stage 3 unspecified: Secondary | ICD-10-CM | POA: Insufficient documentation

## 2020-02-08 DIAGNOSIS — W19XXXA Unspecified fall, initial encounter: Secondary | ICD-10-CM | POA: Insufficient documentation

## 2020-02-08 DIAGNOSIS — M545 Low back pain: Secondary | ICD-10-CM | POA: Insufficient documentation

## 2020-02-08 DIAGNOSIS — I129 Hypertensive chronic kidney disease with stage 1 through stage 4 chronic kidney disease, or unspecified chronic kidney disease: Secondary | ICD-10-CM | POA: Insufficient documentation

## 2020-02-08 DIAGNOSIS — M25552 Pain in left hip: Secondary | ICD-10-CM | POA: Diagnosis not present

## 2020-02-08 DIAGNOSIS — S39012S Strain of muscle, fascia and tendon of lower back, sequela: Secondary | ICD-10-CM

## 2020-02-08 DIAGNOSIS — Z7982 Long term (current) use of aspirin: Secondary | ICD-10-CM | POA: Diagnosis not present

## 2020-02-08 MED ORDER — ACETAMINOPHEN 325 MG PO TABS
650.0000 mg | ORAL_TABLET | Freq: Once | ORAL | Status: DC
  Filled 2020-02-08: qty 2

## 2020-02-08 MED ORDER — HYDROCODONE-ACETAMINOPHEN 5-325 MG PO TABS: 1.0000 | ORAL_TABLET | ORAL | 0 refills | Status: DC | PRN

## 2020-02-08 MED ORDER — HYDROCODONE-ACETAMINOPHEN 5-325 MG PO TABS
1.0000 | ORAL_TABLET | Freq: Once | ORAL | Status: AC
  Administered 2020-02-08: 1 via ORAL
  Filled 2020-02-08: qty 1

## 2020-02-08 NOTE — ED Triage Notes (Signed)
Pt reports falling 2 weeks ago on her back and having bilat hip pains and having leg swelling and right knee swelling. Reports when she was here in May had tests done and they could not figure out the cause for swelling in her legs.

## 2020-02-08 NOTE — ED Provider Notes (Signed)
[place] DEPT Provider Note   CSN: [id number] Arrival date & time: 02/08/20  1727     History Chief Complaint  Patient presents with  . Back Pain  . Hip Pain    Kristina [name] is a 86 y.o. female.  86 year old female presents with low back and left hip pain after a fall that she sustained 2 weeks ago.  States that initially she was okay but did not last 2 days she started having dull pain is worse with movement.  Denies any distal numbness or tingling to her legs.  Has been using Aleve with temporary relief.  No bowel or bladder dysfunction.  Pain better with remaining still.  No rashes noted to her flank.  Denies any dysuria or hematuria.  She is able to ambulate.        Past Medical History:  Diagnosis Date  . AAA (abdominal aortic aneurysm) (HCC)    Mild - measuring 2.8x2.8cm  . Acute hyponatremia 11/2016  . 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 HOH (hard of hearing)   . Hyperlipidemia    Well-controlled  . Hypertension   . PAD (peripheral artery disease) (HCC)    LEA Dopplers: Right external iliac 40%, right mid SFA 70-99% with occlusion in Hunter's canal. Angiography. LEFT common iliac 60%, left SFA 70-9%, popliteal 50%; RIGHT posterior tibial occluded;; carotid Dopplers in 2009: Less than 50% stenosis.  . Renal artery stenosis [facility])     Patient Active Problem List   Diagnosis Date Noted  . Lt Hip fracture ([place]) 08/17/2017  . Fecal impaction in rectum ([place]) 08/17/2017  . Bilateral carotid bruits 07/18/2017  . Dizziness 12/09/2016  . Weakness 12/09/2016  . Malnutrition of moderate degree 12/03/2016  . Acute hyponatremia 12/01/2016  . Protein-calorie malnutrition ([place]) 12/01/2016  . Anemia 12/01/2016  . Pancreatic calcification 12/01/2016  . Abdominal pain, epigastric 12/01/2016  . Hyponatremia   . Claudication ([place]) 06/27/2016  .  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 Essential hypertension   . Hyperlipidemia with target low density lipoprotein (LDL) cholesterol less than 70 mg/dL   . Chronic kidney disease (CKD), stage III (moderate) (HCC)   . AAA (abdominal aortic aneurysm) [facility])     Past Surgical History:  Procedure Laterality Date  . INTRAMEDULLARY (IM) NAIL INTERTROCHANTERIC Left 08/18/2017   Procedure: INTRAMEDULLARY (IM) NAIL INTERTROCHANTRIC;  Surgeon: Leandrew Koyanagi, MD;  Location: [place];  Service: Orthopedics;  Laterality: Left;  . LOWER EXTREMITY ARTERIAL DOPPLER  05/30/2011   Right EIA->50% daimeter reduction, Rt SFA 70-99% diameter reduction, Rt SFA-occlusive disease at Hunter's canal, Rt PTA-appeared occluded, Lft CIA-moderate amount of calcific plaque w/ >60% diameter reduction, Lft prox SFA 70-99% diameter reduction, Lft POP-large amount of irregular mixed suggesting <50% diameter reduction  . NM MYOVIEW LTD  06/25/2008   No scintigraphic evidence of inducible myocardial ischemia  . PERIPHERAL VASCULAR ANGIOGRAM  07/13/2009   No intervention - "plan on doing staged intervention in near future"  . TRANSTHORACIC ECHOCARDIOGRAM  11/14/2007   EF >55%, mild mitral and tricuspid regurg.     OB History   No obstetric history on file.     Family History  Problem Relation Age of Onset  . Heart attack Brother   . Cancer Brother  Bone cancer    Social History   Tobacco Use  . Smoking status: Former Smoker    Types: Cigarettes    Quit date: 07/11/2001    Years since quitting: 18.5  . Smokeless tobacco: Never Used  Vaping Use  . Vaping Use: Never used  Substance Use Topics  . Alcohol use: Yes    Alcohol/week: 1.0 standard drink    Types: 1 Glasses of wine per week  . Drug use: No    Home Medications Prior to Admission medications   Medication Sig Start Date End Date Taking?  Authorizing Provider  aspirin 81 MG tablet Take 81 mg by mouth daily.    [provider]  Coenzyme Q10 (COQ10) 100 MG CAPS Take 100 mg by mouth 3 (three) times daily with meals. Patient not taking: Reported on 12/09/2019 07/17/17   Leonie Man, MD  Docusate Sodium (COLACE PO) Take 1 tablet by mouth daily.    [provider]  enoxaparin (LOVENOX) 40 MG/0.4ML injection Inject 0.4 mLs (40 mg total) into the skin daily. Patient not taking: Reported on 12/09/2019 08/18/17   Leandrew Koyanagi, MD  feeding supplement, ENSURE ENLIVE, (ENSURE ENLIVE) LIQD Take 237 mLs by mouth 2 (two) times daily between meals. Patient not taking: Reported on 12/09/2019 08/21/17   Raiford Noble Latif, DO  hydrALAZINE (APRESOLINE) 50 MG tablet Take 50 mg by mouth 2 (two) times daily.    [provider]  HYDROcodone-acetaminophen (NORCO) 7.5-325 MG tablet Take 1-2 tablets by mouth every 6 (six) hours as needed for moderate pain. Patient not taking: Reported on 12/09/2019 08/18/17   Leandrew Koyanagi, MD  pantoprazole (PROTONIX) 40 MG tablet Take 1 tablet (40 mg total) by mouth daily. Patient not taking: Reported on 12/09/2019 08/22/17   Raiford Noble Latif, DO  polyethylene glycol [facility] / GLYCOLAX) packet Take 17 g by mouth daily.    [provider]  Probiotic Product (ALIGN) 4 MG CAPS Take 4 mg by mouth daily after lunch.     [provider]  Vitamin D, Ergocalciferol, (DRISDOL) 1.25 MG (50000 UNIT) CAPS capsule Take 50,000 Units by mouth every 7 (seven) days.    [provider]    Allergies    Lipitor [atorvastatin], Pletal [cilostazol], and Simvastatin  Review of Systems   Review of Systems  All other systems reviewed and are negative.   Physical Exam Updated Vital Signs BP (!) 150/105 (BP Location: Left Arm)   Pulse 77   Temp 98.2 F (36.8 C) (Oral)   Resp 16   SpO2 95%   Physical Exam Vitals and nursing note reviewed.  Constitutional:      General: She is  not in acute distress.    Appearance: Normal appearance. She is well-developed. She is not toxic-appearing.  HENT:     Head: Normocephalic and atraumatic.  Eyes:     General: Lids are normal.     Conjunctiva/sclera: Conjunctivae normal.     Pupils: Pupils are equal, round, and reactive to light.  Neck:     Thyroid: No thyroid mass.     Trachea: No tracheal deviation.  Cardiovascular:     Rate and Rhythm: Normal rate and regular rhythm.     Heart sounds: Normal heart sounds. No murmur heard.  No gallop.   Pulmonary:     Effort: Pulmonary effort is normal. No respiratory distress.     Breath sounds: Normal breath sounds. No stridor. No decreased breath sounds, wheezing, rhonchi or rales.  Abdominal:     General: Bowel sounds are normal. There is no distension.     Palpations: Abdomen is soft.     Tenderness: There is no abdominal tenderness. There is no rebound.  Musculoskeletal:        General: Normal range of motion.     Cervical back: Normal range of motion and neck supple.     Lumbar back: Tenderness and bony tenderness present.       Back:  Skin:    General: Skin is warm and dry.     Findings: No abrasion or rash.  Neurological:     Mental Status: She is alert and oriented to person, place, and time.     GCS: GCS eye subscore is 4. GCS verbal subscore is 5. GCS motor subscore is 6.     Cranial Nerves: No cranial nerve deficit.     Sensory: No sensory deficit.     Comments: Strength is 5 of 5 in upper as well as lower extremities  Psychiatric:        Speech: Speech normal.        Behavior: Behavior normal.     ED Results / Procedures / Treatments   Labs (all labs ordered are listed, but only abnormal results are displayed) Labs Reviewed - No data to display  EKG None  Radiology DG HIPS BILAT WITH PELVIS MIN 5 VIEWS  Result Date: 02/08/2020 CLINICAL DATA:  Fall 2 weeks ago. Left hip pain. Posterior right hip pain. EXAM: DG HIP (WITH OR WITHOUT PELVIS) 5+V BILAT  COMPARISON:  None. FINDINGS: Gamma nails and an intramedullary femoral rod are seen on the left consistent with previous fracture repair. No acute fractures are noted. Vascular calcifications are identified. IMPRESSION: No acute fractures. Electronically Signed   By: Dorise Bullion III M.D   On: 02/08/2020 20:51    Procedures Procedures (including critical care time)  Medications Ordered in ED Medications - No data to display  ED Course  I have reviewed the triage vital signs and the nursing notes.  Pertinent labs & imaging results that were available during my care of the patient were reviewed by me and considered in my medical decision making (see chart for details).    MDM Rules/Calculators/A&P                          Given hydrocodone here for pain.  Will be given prescription for same as she can tolerate this in the past and will discharge Final Clinical Impression(s) / ED Diagnoses Final diagnoses:  Fall    Rx / DC Orders ED Discharge Orders    None       Lacretia Leigh, MD 02/08/20 2309

## 2020-04-16 ENCOUNTER — Other Ambulatory Visit: Payer: Self-pay

## 2020-04-16 ENCOUNTER — Ambulatory Visit (HOSPITAL_COMMUNITY)
Admission: RE | Admit: 2020-04-16 | Discharge: 2020-04-16 | Disposition: A | Discharge status: Home / Self Care | Payer: Medicare Other | Source: Ambulatory Visit | Attending: Internal Medicine | Admitting: Internal Medicine

## 2020-04-16 ENCOUNTER — Other Ambulatory Visit (HOSPITAL_COMMUNITY): Payer: Self-pay | Admitting: Internal Medicine

## 2020-04-16 DIAGNOSIS — R238 Other skin changes: Secondary | ICD-10-CM

## 2020-04-16 DIAGNOSIS — M7989 Other specified soft tissue disorders: Secondary | ICD-10-CM | POA: Diagnosis present

## 2020-05-05 ENCOUNTER — Encounter (HOSPITAL_BASED_OUTPATIENT_CLINIC_OR_DEPARTMENT_OTHER): Payer: Self-pay | Admitting: Emergency Medicine

## 2020-05-05 ENCOUNTER — Emergency Department (HOSPITAL_BASED_OUTPATIENT_CLINIC_OR_DEPARTMENT_OTHER)
Admission: EM | Admit: 2020-05-05 | Discharge: 2020-05-05 | Disposition: A | Discharge status: Home / Self Care | Payer: Medicare Other | Attending: Emergency Medicine | Admitting: Emergency Medicine

## 2020-05-05 ENCOUNTER — Emergency Department (HOSPITAL_BASED_OUTPATIENT_CLINIC_OR_DEPARTMENT_OTHER): Payer: Medicare Other

## 2020-05-05 ENCOUNTER — Other Ambulatory Visit: Payer: Self-pay

## 2020-05-05 DIAGNOSIS — S0003XA Contusion of scalp, initial encounter: Secondary | ICD-10-CM | POA: Diagnosis not present

## 2020-05-05 DIAGNOSIS — N183 Chronic kidney disease, stage 3 unspecified: Secondary | ICD-10-CM | POA: Insufficient documentation

## 2020-05-05 DIAGNOSIS — S0101XA Laceration without foreign body of scalp, initial encounter: Secondary | ICD-10-CM

## 2020-05-05 DIAGNOSIS — Z79899 Other long term (current) drug therapy: Secondary | ICD-10-CM | POA: Diagnosis not present

## 2020-05-05 DIAGNOSIS — Y9301 Activity, walking, marching and hiking: Secondary | ICD-10-CM | POA: Insufficient documentation

## 2020-05-05 DIAGNOSIS — I129 Hypertensive chronic kidney disease with stage 1 through stage 4 chronic kidney disease, or unspecified chronic kidney disease: Secondary | ICD-10-CM | POA: Diagnosis not present

## 2020-05-05 DIAGNOSIS — S0990XA Unspecified injury of head, initial encounter: Secondary | ICD-10-CM

## 2020-05-05 DIAGNOSIS — Y92009 Unspecified place in unspecified non-institutional (private) residence as the place of occurrence of the external cause: Secondary | ICD-10-CM | POA: Insufficient documentation

## 2020-05-05 DIAGNOSIS — Z87891 Personal history of nicotine dependence: Secondary | ICD-10-CM | POA: Insufficient documentation

## 2020-05-05 DIAGNOSIS — S0000XA Unspecified superficial injury of scalp, initial encounter: Secondary | ICD-10-CM | POA: Diagnosis present

## 2020-05-05 DIAGNOSIS — Z7982 Long term (current) use of aspirin: Secondary | ICD-10-CM | POA: Diagnosis not present

## 2020-05-05 DIAGNOSIS — W19XXXA Unspecified fall, initial encounter: Secondary | ICD-10-CM | POA: Diagnosis not present

## 2020-05-05 DIAGNOSIS — R600 Localized edema: Secondary | ICD-10-CM | POA: Insufficient documentation

## 2020-05-05 LAB — CBC WITH DIFFERENTIAL/PLATELET
Abs Immature Granulocytes: 0.05 10*3/uL (ref 0.00–0.07)
Basophils Absolute: 0 10*3/uL (ref 0.0–0.1)
Basophils Relative: 0 %
Eosinophils Absolute: 0.1 10*3/uL (ref 0.0–0.5)
Eosinophils Relative: 1 %
HCT: 27.6 % — ABNORMAL LOW (ref 36.0–46.0)
Hemoglobin: 8.8 g/dL — ABNORMAL LOW (ref 12.0–15.0)
Immature Granulocytes: 1 %
Lymphocytes Relative: 12 %
Lymphs Abs: 1.2 10*3/uL (ref 0.7–4.0)
MCH: 31.4 pg (ref 26.0–34.0)
MCHC: 31.9 g/dL (ref 30.0–36.0)
MCV: 98.6 fL (ref 80.0–100.0)
Monocytes Absolute: 0.9 10*3/uL (ref 0.1–1.0)
Monocytes Relative: 9 %
Neutro Abs: 7.8 10*3/uL — ABNORMAL HIGH (ref 1.7–7.7)
Neutrophils Relative %: 77 %
Platelets: 427 10*3/uL — ABNORMAL HIGH (ref 150–400)
RBC: 2.8 MIL/uL — ABNORMAL LOW (ref 3.87–5.11)
RDW: 14.4 % (ref 11.5–15.5)
WBC: 10.1 10*3/uL (ref 4.0–10.5)
nRBC: 0 % (ref 0.0–0.2)

## 2020-05-05 LAB — BASIC METABOLIC PANEL
Anion gap: 11 (ref 5–15)
BUN: 31 mg/dL — ABNORMAL HIGH (ref 8–23)
CO2: 28 mmol/L (ref 22–32)
Calcium: 9.4 mg/dL (ref 8.9–10.3)
Chloride: 97 mmol/L — ABNORMAL LOW (ref 98–111)
Creatinine, Ser: 1.15 mg/dL — ABNORMAL HIGH (ref 0.44–1.00)
GFR calc Af Amer: 48 mL/min — ABNORMAL LOW (ref >60)
GFR calc non Af Amer: 41 mL/min — ABNORMAL LOW (ref >60)
Glucose, Bld: 101 mg/dL — ABNORMAL HIGH (ref 70–99)
Potassium: 4.2 mmol/L (ref 3.5–5.1)
Sodium: 136 mmol/L (ref 135–145)

## 2020-05-05 LAB — BRAIN NATRIURETIC PEPTIDE: B Natriuretic Peptide: 106.6 pg/mL — ABNORMAL HIGH (ref 0.0–100.0)

## 2020-05-05 LAB — TROPONIN I (HIGH SENSITIVITY)
Troponin I (High Sensitivity): 9 ng/L (ref <18)
Troponin I (High Sensitivity): 10 ng/L (ref <18)

## 2020-05-05 LAB — CBG MONITORING, ED: Glucose-Capillary: 95 mg/dL (ref 70–99)

## 2020-05-05 MED ORDER — SODIUM CHLORIDE 0.9 % IV SOLN: INTRAVENOUS | Status: DC

## 2020-05-05 MED ORDER — FENTANYL CITRATE (PF) 100 MCG/2ML IJ SOLN
12.5000 ug | Freq: Once | INTRAMUSCULAR | Status: AC
  Administered 2020-05-05: 12.5 ug via INTRAVENOUS
  Filled 2020-05-05: qty 2

## 2020-05-05 NOTE — Discharge Instructions (Addendum)
Please follow-up with primary doctor regarding the fall.  Please follow-up with home health as discussed.  Return to ER for reassessment if she has any further falls, develops chest pain or difficulty in breathing or other new concerning symptom.

## 2020-05-05 NOTE — ED Triage Notes (Signed)
PT fell tonight at home and has a laceration noted to the right side of her head  Bleeding controlled at this time  Unknown LOC  Daughter states EMS came out but pt did not want to be transported so daughter brought her in  Pt states she does not know how she fell  Pt also has swelling to her bilateral lower extremities  Pt has a wound to her left calf that is 31-41 weeks old and a scabbed wound to her right posterior calf   Pt states she is being seen by [facility] for those

## 2020-05-05 NOTE — ED Notes (Signed)
Patient transported to CT 

## 2020-05-05 NOTE — ED Provider Notes (Signed)
Signout note  86 year old lady presented to ER after fall.  Mild head trauma, bleeding stopped.  Basic labs within normal limits.  Case management consulted, daughter at bedside.  Repeat troponin sent.  Anticipate discharge if repeat troponin negative.  8:43 AM Reassessed patient, remains well-appearing, no ongoing symptoms.  Repeat troponin is within normal limits, no delta troponin.  Case management has arranged home health.  Will discharge home.   Lucrezia Starch, MD 05/05/20 [phone]

## 2020-05-05 NOTE — ED Provider Notes (Signed)
[place] EMERGENCY DEPARTMENT Provider Note  CSN: [id number] Arrival date & time: 05/05/20 9242  Chief Complaint(s) Fall  HPI Kristina [name] is a 86 y.o. female who presents to the emergency department after a fall at home.  Patient reports that she got up to go to the kitchen to take her nighttime medication around 11 PM.  While walking back from the kitchen, she fell.  Patient does not remember the cause of the fall.  Remembers waking up on the floor with blood around there.  She was able to call her daughter-in-law for help, who called EMS.  The patient denied feeling any chest pain or shortness of breath prior to the fall.  She is endorsing right-sided head pain.  No neck pain.  No back pain.  No chest pain.  No abdominal pain.  No hip pain.  No new lower extremity pain.  She does report history of peripheral edema and right and left calve wounds that are being managed by PCP.  HPI  Past Medical History Past Medical History:  Diagnosis Date  . AAA (abdominal aortic aneurysm) (HCC)    Mild - measuring 2.8x2.8cm  . Acute hyponatremia 11/2016  . 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 HOH (hard of hearing)   . Hyperlipidemia    Well-controlled  . Hypertension   . PAD (peripheral artery disease) (HCC)    LEA Dopplers: Right external iliac 40%, right mid SFA 70-99% with occlusion in Hunter's canal. Angiography. LEFT common iliac 60%, left SFA 70-9%, popliteal 50%; RIGHT posterior tibial occluded;; carotid Dopplers in 2009: Less than 50% stenosis.  . Renal artery stenosis [facility])    Patient Active Problem List   Diagnosis Date Noted  . Lt Hip fracture ([place]) 08/17/2017  . Fecal impaction in rectum ([place]) 08/17/2017  . Bilateral carotid bruits 07/18/2017  . Dizziness 12/09/2016  . Weakness 12/09/2016  . Malnutrition of moderate degree 12/03/2016  . Acute hyponatremia 12/01/2016  .  Protein-calorie malnutrition ([place]) 12/01/2016  . Anemia 12/01/2016  . Pancreatic calcification 12/01/2016  . Abdominal pain, epigastric 12/01/2016  . Hyponatremia   . Claudication ([place]) 06/27/2016  . 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 Essential hypertension   . Hyperlipidemia with target low density lipoprotein (LDL) cholesterol less than 70 mg/dL   . Chronic kidney disease (CKD), stage III (moderate) (HCC)   . AAA (abdominal aortic aneurysm) ([place])    Home Medication(s) Prior to Admission medications   Medication Sig Start Date End Date Taking? Authorizing Provider  aspirin 81 MG tablet Take 81 mg by mouth daily.    [provider]  Coenzyme Q10 (COQ10) 100 MG CAPS Take 100 mg by mouth 3 (three) times daily with meals. Patient not taking: Reported on 12/09/2019 07/17/17   Leonie Man, MD  Docusate Sodium (COLACE PO) Take 1 tablet by mouth daily.    [provider]  enoxaparin (LOVENOX) 40 MG/0.4ML injection Inject 0.4 mLs (40 mg total) into the skin daily. Patient not taking: Reported on 12/09/2019 08/18/17   Leandrew Koyanagi, MD  feeding supplement, ENSURE ENLIVE, (ENSURE ENLIVE) LIQD Take 237 mLs by mouth 2 (two) times daily between meals. Patient not taking: Reported on 12/09/2019 08/21/17   Raiford Noble Latif, DO  hydrALAZINE (APRESOLINE) 50 MG tablet Take 50 mg by  mouth 2 (two) times daily.    [provider]  HYDROcodone-acetaminophen (NORCO) 7.5-325 MG tablet Take 1-2 tablets by mouth every 6 (six) hours as needed for moderate pain. Patient not taking: Reported on 12/09/2019 08/18/17   Leandrew Koyanagi, MD  HYDROcodone-acetaminophen (NORCO/VICODIN) 5-325 MG tablet Take 1-2 tablets by mouth every 4 (four) hours as needed. 02/08/20   Lacretia Leigh, MD  pantoprazole (PROTONIX) 40 MG tablet Take 1 tablet (40 mg total) by mouth daily. Patient not taking:  Reported on 12/09/2019 08/22/17   Raiford Noble Latif, DO  polyethylene glycol [facility] / GLYCOLAX) packet Take 17 g by mouth daily.    [provider]  Probiotic Product (ALIGN) 4 MG CAPS Take 4 mg by mouth daily after lunch.     [provider]  Vitamin D, Ergocalciferol, (DRISDOL) 1.25 MG (50000 UNIT) CAPS capsule Take 50,000 Units by mouth every 7 (seven) days.    [provider]                                                                                                                                    Past Surgical History Past Surgical History:  Procedure Laterality Date  . INTRAMEDULLARY (IM) NAIL INTERTROCHANTERIC Left 08/18/2017   Procedure: INTRAMEDULLARY (IM) NAIL INTERTROCHANTRIC;  Surgeon: Leandrew Koyanagi, MD;  Location: [place];  Service: Orthopedics;  Laterality: Left;  . LOWER EXTREMITY ARTERIAL DOPPLER  05/30/2011   Right EIA->50% daimeter reduction, Rt SFA 70-99% diameter reduction, Rt SFA-occlusive disease at Hunter's canal, Rt PTA-appeared occluded, Lft CIA-moderate amount of calcific plaque w/ >60% diameter reduction, Lft prox SFA 70-99% diameter reduction, Lft POP-large amount of irregular mixed suggesting <50% diameter reduction  . NM MYOVIEW LTD  06/25/2008   No scintigraphic evidence of inducible myocardial ischemia  . PERIPHERAL VASCULAR ANGIOGRAM  07/13/2009   No intervention - "plan on doing staged intervention in near future"  . TRANSTHORACIC ECHOCARDIOGRAM  11/14/2007   EF >55%, mild mitral and tricuspid regurg.   Family History Family History  Problem Relation Age of Onset  . Heart attack Brother   . Cancer Brother        Bone cancer    Social History Social History   Tobacco Use  . Smoking status: Former Smoker    Types: Cigarettes    Quit date: 07/11/2001    Years since quitting: 18.8  . Smokeless tobacco: Never Used  Vaping Use  . Vaping Use: Never used  Substance Use Topics  . Alcohol use: Yes    Alcohol/week: 1.0  standard drink    Types: 1 Glasses of wine per week  . Drug use: No   Allergies Lipitor [atorvastatin], Pletal [cilostazol], and Simvastatin  Review of Systems Review of Systems All other systems are reviewed and are negative for acute change except as noted in the HPI  Physical Exam Vital Signs  I have reviewed the triage vital signs BP Marland Kitchen)  183/85   Pulse 75   Temp 97.6 F (36.4 C) (Oral)   Resp (!) 23   Ht 4\' 10"  (1.473 m)   Wt 35.4 kg   SpO2 96%   BMI 16.30 kg/m   Physical Exam Vitals reviewed.  Constitutional:      General: She is not in acute distress.    Appearance: She is well-developed. She is not diaphoretic.     Comments: frail  HENT:     Head: Normocephalic. Contusion and laceration present.      Right Ear: External ear normal.     Left Ear: External ear normal.     Nose: Nose normal.  Eyes:     General: No scleral icterus.       Right eye: No discharge.        Left eye: No discharge.     Conjunctiva/sclera: Conjunctivae normal.     Pupils: Pupils are equal, round, and reactive to light.  Cardiovascular:     Rate and Rhythm: Normal rate and regular rhythm.     Pulses:          Radial pulses are 2+ on the right side and 2+ on the left side.       Dorsalis pedis pulses are 2+ on the right side and 2+ on the left side.     Heart sounds: Normal heart sounds. No murmur heard.  No friction rub. No gallop.   Pulmonary:     Effort: Pulmonary effort is normal. No respiratory distress.     Breath sounds: Normal breath sounds. No stridor. No wheezing or rales.  Abdominal:     General: There is no distension.     Palpations: Abdomen is soft.     Tenderness: There is no abdominal tenderness.  Musculoskeletal:        General: No tenderness.     Cervical back: Normal range of motion and neck supple. No bony tenderness. No spinous process tenderness or muscular tenderness.     Thoracic back: No bony tenderness.     Lumbar back: No bony tenderness.     Right  lower leg: 2+ Pitting Edema present.     Left lower leg: 2+ Pitting Edema present.       Legs:     Comments: Clavicles stable. Chest stable to AP/Lat compression. Pelvis stable to Lat compression. No obvious extremity deformity. No chest or abdominal wall contusion.  Skin:    General: Skin is warm and dry.     Findings: No erythema or rash.  Neurological:     Mental Status: She is alert and oriented to person, place, and time.     Comments: Moving all extremities     ED Results and Treatments Labs (all labs ordered are listed, but only abnormal results are displayed) Labs Reviewed  BASIC METABOLIC PANEL - Abnormal; Notable for the following components:      Result Value   Chloride 97 (*)    Glucose, Bld 101 (*)    BUN 31 (*)    Creatinine, Ser 1.15 (*)    GFR calc non Af Amer 41 (*)    GFR calc Af Amer 48 (*)    All other components within normal limits  CBC WITH DIFFERENTIAL/PLATELET - Abnormal; Notable for the following components:   RBC 2.80 (*)    Hemoglobin 8.8 (*)    HCT 27.6 (*)    Platelets 427 (*)    Neutro Abs 7.8 (*)    All other  components within normal limits  BRAIN NATRIURETIC PEPTIDE - Abnormal; Notable for the following components:   B Natriuretic Peptide 106.6 (*)    All other components within normal limits  URINALYSIS, ROUTINE W REFLEX MICROSCOPIC  CBG MONITORING, ED  TROPONIN I (HIGH SENSITIVITY)  TROPONIN I (HIGH SENSITIVITY)                                                                                                                         EKG  EKG Interpretation  Date/Time:  Tuesday May 05 2020 05:47:47 EDT Ventricular Rate:  74 PR Interval:    QRS Duration: 81 QT Interval:  385 QTC Calculation: 428 R Axis:   -21 Text Interpretation: Sinus rhythm Probable left atrial enlargement Left ventricular hypertrophy Anterior ST elevation, probably due to LVH No acute changes Confirmed by Addison Lank [phone]) on 05/05/2020 5:58:44 AM        Radiology CT Head Wo Contrast  Result Date: 05/05/2020 CLINICAL DATA:  Fall with minor head trauma EXAM: CT HEAD WITHOUT CONTRAST TECHNIQUE: Contiguous axial images were obtained from the base of the skull through the vertex without intravenous contrast. COMPARISON:  12/09/2016 FINDINGS: Brain: No evidence of acute infarction, hemorrhage, hydrocephalus, extra-axial collection or mass lesion/mass effect. Chronic small vessel ischemia which is extensive in the cerebral white matter. Cerebral volume loss without detected progression. Vascular: No hyperdense vessel or unexpected calcification. Skull: High right-sided scalp swelling without calvarial fracture. Sinuses/Orbits: No evidence of injury.  Bilateral cataract resection IMPRESSION: 1. No evidence of intracranial injury. 2. Right-sided scalp swelling without calvarial fracture. Electronically Signed   By: Monte Fantasia M.D.   On: 05/05/2020 05:43    Pertinent labs & imaging results that were available during my care of the patient were reviewed by me and considered in my medical decision making (see chart for details).  Medications Ordered in ED Medications  0.9 %  sodium chloride infusion ( Intravenous New Bag/Given 05/05/20 0538)  fentaNYL (SUBLIMAZE) injection 12.5 mcg (has no administration in time range)                                                                                                                                    Procedures Procedures  (including critical care time)  Medical Decision Making / ED Course I have reviewed the nursing notes for this encounter and the patient's prior records (if available in EHR or  on provided paperwork).   AMNA WELKER was evaluated in Emergency Department on 05/05/2020 for the symptoms described in the history of present illness. She was evaluated in the context of the global COVID-19 pandemic, which necessitated consideration that the patient might be at risk for infection with  the SARS-CoV-2 virus that causes COVID-19. Institutional protocols and algorithms that pertain to the evaluation of patients at risk for COVID-19 are in a state of rapid change based on information released by regulatory bodies including the CDC and federal and state organizations. These policies and algorithms were followed during the patient's care in the ED.  Fall with head trauma. Possible syncope. No chest pain.  Has small superficial laceration with hematoma to right parietal scalp. Irrigated. No need for primary closure.  EKG w/o acute ischemic changes, dysrhythmias, or blocks. Initial trop negative. BNP stable from prior.  CT head w/o ICH or fracture. CBC with hb. 8.8. down from 11 four months ago, but similar to all others. Stable renal function.  Will get delta trop and reassess.  Patient care turned over to Dr Roslynn Amble. Patient case and results discussed in detail; please see their note for further ED managment.        This chart was dictated using voice recognition software.  Despite best efforts to proofread,  errors can occur which can change the documentation meaning.   Fatima Blank, MD 05/05/20 [phone]

## 2020-05-05 NOTE — Progress Notes (Addendum)
..   Transition of Care [facility]) - Emergency Department Mini Assessment   Patient Details  Name: Kristina [name] MRN: [id number] Date of Birth: 01-31-1926  Transition of Care [facility]) CM/SW Contact:    Hisae Decoursey C Tarpley-Carter, [place] Phone Number: 05/05/2020, 8:48 AM   Clinical Narrative: TOC CSW spoke with [name] Rierson/pts daughter regarding DME and HH.  [name] stated pt did not need any DME, but would like HH.  They have had [facility] in the past, but have not been able to contact them.  They asked for a different HH.    CSW contacted [place], TOC CM/RN for assistance with arranging HH.  CSW will continue to follow for dc needs.  Jireh Vinas Tarpley-Carter, MSW, LCSW-A Pronouns:  She, Her, Hers                  [facility] ED Transitions of CareClinical Social Worker [email or web address] [phone]   ED Mini Assessment: What brought you to the Emergency Department? : Fall  Barriers to Discharge: No Barriers Identified     Means of departure: Ambulance  Interventions which prevented an admission or readmission: [place] or Services    Patient Contact and Communications Key Contact 1: [name]   Spoke with: Delorse Limber Date: 05/05/20,   Contact time: 0810 Contact Phone Number: [phone] Call outcome: [name] assisted CSW with the information needed to assist pt with [facility] and DME.  Patient states their goals for this hospitalization and ongoing recovery are:: Patient would like [place] that is reliable.      Admission diagnosis:  Fall Patient Active Problem List   Diagnosis Date Noted  . Lt Hip fracture ([place]) 08/17/2017  . Fecal impaction in rectum ([place]) 08/17/2017  . Bilateral carotid bruits 07/18/2017  . Dizziness 12/09/2016  . Weakness 12/09/2016  . Malnutrition of moderate degree 12/03/2016  . Acute hyponatremia 12/01/2016  . Protein-calorie malnutrition ([place]) 12/01/2016  . Anemia 12/01/2016  . Pancreatic  calcification 12/01/2016  . Abdominal pain, epigastric 12/01/2016  . Hyponatremia   . Claudication ([place]) 06/27/2016  . 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 Essential hypertension   . Hyperlipidemia with target low density lipoprotein (LDL) cholesterol less than 70 mg/dL   . Chronic kidney disease (CKD), stage III (moderate) (HCC)   . AAA (abdominal aortic aneurysm) ([place])    PCP:  Holland Commons, FNP Pharmacy:   [facility] [place], [place] - [address] [place] [address] Phone: [phone] Fax: [phone]  [facility] [address], [place] [address] Phone: [phone] Fax: [phone]

## 2020-05-06 NOTE — Progress Notes (Addendum)
05/06/2020 4:58 pm TOC CM received confirmation from [facility] rep they can accept patient. Jonnie Finner RN CCM, [facility] ED TOC Jearld Lesch [phone]  05/06/2020 TOC CM reviewed referral. Confirmation from [facility] not received from [facility]. Contacted [place] for [facility] and they are not able to accept due to staffing issues. Contacted [facility] rep, Amy to review. Contacted pt and dtr and left HIPAA complaint messages to return call. [place], [place] ED TOC CM [phone]

## 2020-07-01 ENCOUNTER — Encounter (HOSPITAL_BASED_OUTPATIENT_CLINIC_OR_DEPARTMENT_OTHER): Payer: Medicare Other | Admitting: Physician Assistant

## 2020-07-03 ENCOUNTER — Encounter (HOSPITAL_BASED_OUTPATIENT_CLINIC_OR_DEPARTMENT_OTHER): Payer: Medicare Other | Admitting: Internal Medicine

## 2020-07-06 ENCOUNTER — Encounter (HOSPITAL_COMMUNITY): Payer: Self-pay | Admitting: Emergency Medicine

## 2020-07-06 ENCOUNTER — Inpatient Hospital Stay (HOSPITAL_COMMUNITY)
Admission: EM | Admit: 2020-07-06 | Discharge: 2020-07-13 | DRG: 871 | Disposition: A | Discharge status: Skilled Nursing Facility | Payer: Medicare Other | Attending: Internal Medicine | Admitting: Internal Medicine

## 2020-07-06 ENCOUNTER — Emergency Department (HOSPITAL_COMMUNITY): Payer: Medicare Other

## 2020-07-06 ENCOUNTER — Other Ambulatory Visit: Payer: Self-pay

## 2020-07-06 DIAGNOSIS — G9341 Metabolic encephalopathy: Secondary | ICD-10-CM | POA: Diagnosis present

## 2020-07-06 DIAGNOSIS — Z20822 Contact with and (suspected) exposure to covid-19: Secondary | ICD-10-CM | POA: Diagnosis present

## 2020-07-06 DIAGNOSIS — K449 Diaphragmatic hernia without obstruction or gangrene: Secondary | ICD-10-CM | POA: Diagnosis present

## 2020-07-06 DIAGNOSIS — Z23 Encounter for immunization: Secondary | ICD-10-CM

## 2020-07-06 DIAGNOSIS — Z681 Body mass index (BMI) 19 or less, adult: Secondary | ICD-10-CM

## 2020-07-06 DIAGNOSIS — R195 Other fecal abnormalities: Secondary | ICD-10-CM

## 2020-07-06 DIAGNOSIS — L03115 Cellulitis of right lower limb: Secondary | ICD-10-CM | POA: Diagnosis present

## 2020-07-06 DIAGNOSIS — E43 Unspecified severe protein-calorie malnutrition: Secondary | ICD-10-CM | POA: Insufficient documentation

## 2020-07-06 DIAGNOSIS — Z66 Do not resuscitate: Secondary | ICD-10-CM | POA: Diagnosis present

## 2020-07-06 DIAGNOSIS — K264 Chronic or unspecified duodenal ulcer with hemorrhage: Secondary | ICD-10-CM | POA: Diagnosis present

## 2020-07-06 DIAGNOSIS — K222 Esophageal obstruction: Secondary | ICD-10-CM | POA: Diagnosis present

## 2020-07-06 DIAGNOSIS — L89226 Pressure-induced deep tissue damage of left hip: Secondary | ICD-10-CM | POA: Diagnosis present

## 2020-07-06 DIAGNOSIS — K922 Gastrointestinal hemorrhage, unspecified: Secondary | ICD-10-CM | POA: Diagnosis not present

## 2020-07-06 DIAGNOSIS — Z87891 Personal history of nicotine dependence: Secondary | ICD-10-CM | POA: Diagnosis not present

## 2020-07-06 DIAGNOSIS — L97319 Non-pressure chronic ulcer of right ankle with unspecified severity: Secondary | ICD-10-CM | POA: Diagnosis present

## 2020-07-06 DIAGNOSIS — E46 Unspecified protein-calorie malnutrition: Secondary | ICD-10-CM | POA: Diagnosis present

## 2020-07-06 DIAGNOSIS — E785 Hyperlipidemia, unspecified: Secondary | ICD-10-CM | POA: Diagnosis present

## 2020-07-06 DIAGNOSIS — D500 Iron deficiency anemia secondary to blood loss (chronic): Secondary | ICD-10-CM | POA: Diagnosis present

## 2020-07-06 DIAGNOSIS — R0600 Dyspnea, unspecified: Secondary | ICD-10-CM | POA: Diagnosis not present

## 2020-07-06 DIAGNOSIS — I70219 Atherosclerosis of native arteries of extremities with intermittent claudication, unspecified extremity: Secondary | ICD-10-CM | POA: Diagnosis not present

## 2020-07-06 DIAGNOSIS — Z79899 Other long term (current) drug therapy: Secondary | ICD-10-CM

## 2020-07-06 DIAGNOSIS — N1832 Chronic kidney disease, stage 3b: Secondary | ICD-10-CM | POA: Diagnosis present

## 2020-07-06 DIAGNOSIS — K5909 Other constipation: Secondary | ICD-10-CM | POA: Diagnosis present

## 2020-07-06 DIAGNOSIS — I129 Hypertensive chronic kidney disease with stage 1 through stage 4 chronic kidney disease, or unspecified chronic kidney disease: Secondary | ICD-10-CM | POA: Diagnosis present

## 2020-07-06 DIAGNOSIS — D649 Anemia, unspecified: Secondary | ICD-10-CM | POA: Diagnosis present

## 2020-07-06 DIAGNOSIS — K2950 Unspecified chronic gastritis without bleeding: Secondary | ICD-10-CM | POA: Diagnosis present

## 2020-07-06 DIAGNOSIS — L97329 Non-pressure chronic ulcer of left ankle with unspecified severity: Secondary | ICD-10-CM | POA: Diagnosis present

## 2020-07-06 DIAGNOSIS — Z7982 Long term (current) use of aspirin: Secondary | ICD-10-CM | POA: Diagnosis not present

## 2020-07-06 DIAGNOSIS — L0390 Cellulitis, unspecified: Secondary | ICD-10-CM | POA: Diagnosis not present

## 2020-07-06 DIAGNOSIS — L03116 Cellulitis of left lower limb: Secondary | ICD-10-CM

## 2020-07-06 DIAGNOSIS — I70212 Atherosclerosis of native arteries of extremities with intermittent claudication, left leg: Secondary | ICD-10-CM | POA: Diagnosis present

## 2020-07-06 DIAGNOSIS — A419 Sepsis, unspecified organism: Secondary | ICD-10-CM | POA: Diagnosis present

## 2020-07-06 DIAGNOSIS — I10 Essential (primary) hypertension: Secondary | ICD-10-CM | POA: Diagnosis present

## 2020-07-06 DIAGNOSIS — N183 Chronic kidney disease, stage 3 unspecified: Secondary | ICD-10-CM | POA: Diagnosis present

## 2020-07-06 DIAGNOSIS — H9190 Unspecified hearing loss, unspecified ear: Secondary | ICD-10-CM | POA: Diagnosis present

## 2020-07-06 LAB — COMPREHENSIVE METABOLIC PANEL
ALT: 27 U/L (ref 0–44)
AST: 32 U/L (ref 15–41)
Albumin: 3.1 g/dL — ABNORMAL LOW (ref 3.5–5.0)
Alkaline Phosphatase: 80 U/L (ref 38–126)
Anion gap: 12 (ref 5–15)
BUN: 47 mg/dL — ABNORMAL HIGH (ref 8–23)
CO2: 24 mmol/L (ref 22–32)
Calcium: 9.2 mg/dL (ref 8.9–10.3)
Chloride: 102 mmol/L (ref 98–111)
Creatinine, Ser: 1.35 mg/dL — ABNORMAL HIGH (ref 0.44–1.00)
GFR, Estimated: 37 mL/min — ABNORMAL LOW (ref >60)
Glucose, Bld: 93 mg/dL (ref 70–99)
Potassium: 4.5 mmol/L (ref 3.5–5.1)
Sodium: 138 mmol/L (ref 135–145)
Total Bilirubin: 0.6 mg/dL (ref 0.3–1.2)
Total Protein: 6 g/dL — ABNORMAL LOW (ref 6.5–8.1)

## 2020-07-06 LAB — VITAMIN B12: Vitamin B-12: 193 pg/mL (ref 180–914)

## 2020-07-06 LAB — CBC WITH DIFFERENTIAL/PLATELET
Abs Immature Granulocytes: 0.07 10*3/uL (ref 0.00–0.07)
Basophils Absolute: 0 10*3/uL (ref 0.0–0.1)
Basophils Relative: 0 %
Eosinophils Absolute: 0 10*3/uL (ref 0.0–0.5)
Eosinophils Relative: 0 %
HCT: 18 % — ABNORMAL LOW (ref 36.0–46.0)
Hemoglobin: 5.2 g/dL — CL (ref 12.0–15.0)
Immature Granulocytes: 1 %
Lymphocytes Relative: 4 %
Lymphs Abs: 0.5 10*3/uL — ABNORMAL LOW (ref 0.7–4.0)
MCH: 28.7 pg (ref 26.0–34.0)
MCHC: 28.9 g/dL — ABNORMAL LOW (ref 30.0–36.0)
MCV: 99.4 fL (ref 80.0–100.0)
Monocytes Absolute: 1 10*3/uL (ref 0.1–1.0)
Monocytes Relative: 8 %
Neutro Abs: 10.6 10*3/uL — ABNORMAL HIGH (ref 1.7–7.7)
Neutrophils Relative %: 87 %
Platelets: 433 10*3/uL — ABNORMAL HIGH (ref 150–400)
RBC: 1.81 MIL/uL — ABNORMAL LOW (ref 3.87–5.11)
RDW: 15.2 % (ref 11.5–15.5)
WBC: 12.2 10*3/uL — ABNORMAL HIGH (ref 4.0–10.5)
nRBC: 0 % (ref 0.0–0.2)

## 2020-07-06 LAB — RETICULOCYTES
Immature Retic Fract: 16.5 % — ABNORMAL HIGH (ref 2.3–15.9)
RBC.: 1.52 MIL/uL — ABNORMAL LOW (ref 3.87–5.11)
Retic Count, Absolute: 71.6 10*3/uL (ref 19.0–186.0)
Retic Ct Pct: 4.7 % — ABNORMAL HIGH (ref 0.4–3.1)

## 2020-07-06 LAB — IRON AND TIBC
Iron: 23 ug/dL — ABNORMAL LOW (ref 28–170)
Saturation Ratios: 5 % — ABNORMAL LOW (ref 10.4–31.8)
TIBC: 490 ug/dL — ABNORMAL HIGH (ref 250–450)
UIBC: 467 ug/dL

## 2020-07-06 LAB — PREPARE RBC (CROSSMATCH)

## 2020-07-06 LAB — LACTIC ACID, PLASMA: Lactic Acid, Venous: 1.1 mmol/L (ref 0.5–1.9)

## 2020-07-06 LAB — RESP PANEL BY RT-PCR (FLU A&B, COVID) ARPGX2
Influenza A by PCR: NEGATIVE
Influenza B by PCR: NEGATIVE
SARS Coronavirus 2 by RT PCR: NEGATIVE

## 2020-07-06 LAB — FERRITIN: Ferritin: 19 ng/mL (ref 11–307)

## 2020-07-06 LAB — FOLATE: Folate: 15.4 ng/mL (ref >5.9)

## 2020-07-06 LAB — POC OCCULT BLOOD, ED: Fecal Occult Bld: POSITIVE — AB

## 2020-07-06 MED ORDER — CEFAZOLIN SODIUM-DEXTROSE 1-4 GM/50ML-% IV SOLN
1.0000 g | Freq: Three times a day (TID) | INTRAVENOUS | Status: DC
  Administered 2020-07-06 – 2020-07-07 (×3): 1 g via INTRAVENOUS
  Filled 2020-07-06 (×5): qty 50

## 2020-07-06 MED ORDER — HYDROCODONE-ACETAMINOPHEN 5-325 MG PO TABS
1.0000 | ORAL_TABLET | ORAL | Status: DC | PRN
  Administered 2020-07-06: 1 via ORAL
  Administered 2020-07-07 – 2020-07-08 (×5): 2 via ORAL
  Administered 2020-07-09: 1 via ORAL
  Filled 2020-07-06 (×3): qty 2
  Filled 2020-07-06: qty 1
  Filled 2020-07-06 (×2): qty 2
  Filled 2020-07-06 (×2): qty 1

## 2020-07-06 MED ORDER — ONDANSETRON HCL 4 MG/2ML IJ SOLN
4.0000 mg | Freq: Four times a day (QID) | INTRAMUSCULAR | Status: DC | PRN
  Administered 2020-07-07: 4 mg via INTRAVENOUS

## 2020-07-06 MED ORDER — PANTOPRAZOLE SODIUM 40 MG IV SOLR
40.0000 mg | Freq: Two times a day (BID) | INTRAVENOUS | Status: DC
  Administered 2020-07-06 – 2020-07-07 (×2): 40 mg via INTRAVENOUS
  Filled 2020-07-06 (×2): qty 40

## 2020-07-06 MED ORDER — SODIUM CHLORIDE 0.9 % IV SOLN
3.0000 g | Freq: Once | INTRAVENOUS | Status: DC
  Filled 2020-07-06: qty 8

## 2020-07-06 MED ORDER — HYDRALAZINE HCL 50 MG PO TABS: 50.0000 mg | ORAL_TABLET | Freq: Two times a day (BID) | ORAL | Status: DC

## 2020-07-06 MED ORDER — ONDANSETRON HCL 4 MG PO TABS: 4.0000 mg | ORAL_TABLET | Freq: Four times a day (QID) | ORAL | Status: DC | PRN

## 2020-07-06 MED ORDER — ACETAMINOPHEN 325 MG PO TABS
650.0000 mg | ORAL_TABLET | Freq: Four times a day (QID) | ORAL | Status: DC | PRN
  Administered 2020-07-06 – 2020-07-13 (×9): 650 mg via ORAL
  Filled 2020-07-06 (×9): qty 2

## 2020-07-06 MED ORDER — HYDRALAZINE HCL 25 MG PO TABS: 50.0000 mg | ORAL_TABLET | Freq: Two times a day (BID) | ORAL | Status: DC

## 2020-07-06 MED ORDER — SODIUM CHLORIDE 0.9% FLUSH
3.0000 mL | Freq: Two times a day (BID) | INTRAVENOUS | Status: DC
  Administered 2020-07-06 – 2020-07-13 (×10): 3 mL via INTRAVENOUS

## 2020-07-06 MED ORDER — RISAQUAD PO CAPS
1.0000 | ORAL_CAPSULE | Freq: Every day | ORAL | Status: DC
  Administered 2020-07-08 – 2020-07-13 (×6): 1 via ORAL
  Filled 2020-07-06 (×6): qty 1

## 2020-07-06 MED ORDER — ALBUTEROL SULFATE (2.5 MG/3ML) 0.083% IN NEBU
2.5000 mg | INHALATION_SOLUTION | Freq: Four times a day (QID) | RESPIRATORY_TRACT | Status: DC | PRN
  Administered 2020-07-10 – 2020-07-13 (×2): 2.5 mg via RESPIRATORY_TRACT
  Filled 2020-07-06 (×2): qty 3

## 2020-07-06 MED ORDER — SODIUM CHLORIDE 0.9 % IV SOLN: 10.0000 mL/h | Freq: Once | INTRAVENOUS | Status: AC

## 2020-07-06 MED ORDER — ACETAMINOPHEN 650 MG RE SUPP: 650.0000 mg | Freq: Four times a day (QID) | RECTAL | Status: DC | PRN

## 2020-07-06 NOTE — Plan of Care (Signed)
  Problem: Clinical Measurements: Goal: Diagnostic test results will improve Outcome: Completed/Met

## 2020-07-06 NOTE — Progress Notes (Signed)
Report received. Room is ready.  

## 2020-07-06 NOTE — ED Notes (Signed)
Pt in room

## 2020-07-06 NOTE — Consult Note (Addendum)
Reason for Consult: Severe anemia with guaiac positive stools. Referring Physician: [facility] hospitalist.  Kristina [name] is an 86 y.o. female.  HPI: Kristina [name] is a 86 year old white female with multiple medical problems listed below presents today emergency room today with swelling of her lower limbs and multiple ulcerations above her ankles along with weakness and dizziness.  On evaluation in the emergency room she was found to have a hemoglobin of 5.2 gms/dl.  Of late she has had worsening fatigue and has had multiple falls.  She has had home health visiting her for "weeping and swelling of the lower legs".  She was started on HCTZ but that has not helped her symptoms much.  GI consultation was requested for work-up of her anemia.  She had a colonoscopy in 2007 when she was noted to have extensive pandiverticular disease and hyperplastic polyps were  removed.  She has never had a follow-up surveillance colonoscopy after that.  She suffers from chronic constipation and takes MiraLAX on a as needed basis.  A few days ago she had some diarrhea for which she took Pepto-Bismol.  She denies having any melena hematochezia.  There is no known family history of colon cancer. CLARIFICATION: On reviewing the patient's chart, the PA who saw the patient earlier today, Carlisle Cater, documented that GI was called on several occasions but as per my discussion with Charise Carwin the nurse in charge in the ER this evening and my conversation with Dr. Fuller Plan who admitted the patient, we have NOT received any phone calls from the PA or the ER doctor about this patient. The nursing secretary who apparently was supposed to place the call to our office for the PA, Novella Olive, had left for the day and if I was not able to talk to her directly. The first contact about a consult on this patient was made by Dr. Tamala Julian the admitting [facility] hospitalist who called me.  I have been in touch with the patient daughter since  7 AM today.  I will notify Dr. Pattricia Boss the ER director about this discrepancy via E-mail.  Past Medical History:  Diagnosis Date  . AAA (abdominal aortic aneurysm) (HCC)    Mild - measuring 2.8x2.8cm  . Acute hyponatremia 11/2016  . 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 HOH (hard of hearing)   . Hyperlipidemia    Well-controlled  . Hypertension   . PAD (peripheral artery disease) (HCC)    LEA Dopplers: Right external iliac 40%, right mid SFA 70-99% with occlusion in Hunter's canal. Angiography. LEFT common iliac 60%, left SFA 70-9%, popliteal 50%; RIGHT posterior tibial occluded;; carotid Dopplers in 2009: Less than 50% stenosis.  . Renal artery stenosis [facility])    Past Surgical History:  Procedure Laterality Date  . INTRAMEDULLARY (IM) NAIL INTERTROCHANTERIC Left 08/18/2017   Procedure: INTRAMEDULLARY (IM) NAIL INTERTROCHANTRIC;  Surgeon: Leandrew Koyanagi, MD;  Location: [place];  Service: Orthopedics;  Laterality: Left;  . LOWER EXTREMITY ARTERIAL DOPPLER  05/30/2011   Right EIA->50% daimeter reduction, Rt SFA 70-99% diameter reduction, Rt SFA-occlusive disease at Hunter's canal, Rt PTA-appeared occluded, Lft CIA-moderate amount of calcific plaque w/ >60% diameter reduction, Lft prox SFA 70-99% diameter reduction, Lft POP-large amount of irregular mixed suggesting <50% diameter reduction  . NM MYOVIEW LTD  06/25/2008   No scintigraphic evidence of inducible myocardial ischemia  . PERIPHERAL VASCULAR ANGIOGRAM  07/13/2009   No intervention - "plan on doing staged intervention in near future"  . TRANSTHORACIC ECHOCARDIOGRAM  11/14/2007   EF >55%, mild mitral and tricuspid regurg.   Family History  Problem Relation Age of Onset  . Heart attack Brother   . Cancer Brother        Bone cancer   Social History:  reports that she quit smoking about 19 years ago. Her smoking use included cigarettes. She  has never used smokeless tobacco. She reports current alcohol use of about 1.0 standard drink of alcohol per week. She reports that she does not use drugs.  Allergies:  Allergies  Allergen Reactions  . Lipitor [Atorvastatin] Other (See Comments)    Reaction:  Unknown   . Pletal [Cilostazol] Other (See Comments)    Reaction:  Unknown   . Simvastatin     MYALGIAS   Medications: I have reviewed the patient's current medications.  Results for orders placed or performed during the hospital encounter of 07/06/20 (from the past 48 hour(s))  Lactic acid, plasma     Status: None   Collection Time: 07/06/20 10:26 AM  Result Value Ref Range   Lactic Acid, Venous 1.1 0.5 - 1.9 mmol/L    Comment: Performed at [facility], [address], [place], [place] [address]  Comprehensive metabolic panel     Status: Abnormal   Collection Time: 07/06/20 10:26 AM  Result Value Ref Range   Sodium 138 135 - 145 mmol/L   Potassium 4.5 3.5 - 5.1 mmol/L   Chloride 102 98 - 111 mmol/L   CO2 24 22 - 32 mmol/L   Glucose, Bld 93 70 - 99 mg/dL    Comment: Glucose reference range applies only to samples taken after fasting for at least 8 hours.   BUN 47 (H) 8 - 23 mg/dL   Creatinine, Ser 1.35 (H) 0.44 - 1.00 mg/dL   Calcium 9.2 8.9 - 10.3 mg/dL   Total Protein 6.0 (L) 6.5 - 8.1 g/dL   Albumin 3.1 (L) 3.5 - 5.0 g/dL   AST 32 15 - 41 U/L   ALT 27 0 - 44 U/L   Alkaline Phosphatase 80 38 - 126 U/L   Total Bilirubin 0.6 0.3 - 1.2 mg/dL   GFR, Estimated 37 (L) >60 mL/min    Comment: (NOTE) Calculated using the CKD-EPI Creatinine Equation (2021)    Anion gap 12 5 - 15    Comment: Performed at [facility], [place] [address] [place],  [address]  CBC with Differential     Status: Abnormal   Collection Time: 07/06/20 10:26 AM  Result Value Ref Range   WBC 12.2 (H) 4.0 - 10.5 K/uL   RBC 1.81 (L) 3.87 - 5.11 MIL/uL   Hemoglobin 5.2 (LL) 12.0 - 15.0 g/dL    Comment: REPEATED TO VERIFY THIS  CRITICAL RESULT HAS VERIFIED AND BEEN CALLED TO GROSE,C,RN BY PHYLLIS GWYN ON 12 06 2021 AT 1148, AND HAS BEEN READ BACK.     HCT 18.0 (L) 36 - 46 %   MCV 99.4 80.0 - 100.0 fL   MCH 28.7 26.0 - 34.0 pg   MCHC 28.9 (L) 30.0 - 36.0 g/dL   RDW 15.2 11.5 - 15.5 %   Platelets 433 (H) 150 - 400 K/uL   nRBC 0.0 0.0 - 0.2 %   Neutrophils Relative % 87 %   Neutro Abs 10.6 (H) 1.7 - 7.7 K/uL   Lymphocytes Relative 4 %  Lymphs Abs 0.5 (L) 0.7 - 4.0 K/uL   Monocytes Relative 8 %   Monocytes Absolute 1.0 0.1 - 1.0 K/uL   Eosinophils Relative 0 %   Eosinophils Absolute 0.0 0.0 - 0.5 K/uL   Basophils Relative 0 %   Basophils Absolute 0.0 0.0 - 0.1 K/uL   Immature Granulocytes 1 %   Abs Immature Granulocytes 0.07 0.00 - 0.07 K/uL    Comment: Performed at [facility] [address], [place], [place] [address]  Type and screen     Status: None (Preliminary result)   Collection Time: 07/06/20  1:00 PM  Result Value Ref Range   ABO/RH(D) A POS    Antibody Screen NEG    Sample Expiration 07/09/2020,2359    Unit Number [id number]    Blood Component Type RED CELLS,LR    Unit division 00    Status of Unit ALLOCATED    Transfusion Status OK TO TRANSFUSE    Crossmatch Result Compatible    Unit Number [id number]    Blood Component Type RED CELLS,LR    Unit division 00    Status of Unit ISSUED    Transfusion Status OK TO TRANSFUSE    Crossmatch Result      Compatible Performed at [facility], [place] [address], [place], [place] [address]   POC occult blood, ED     Status: Abnormal   Collection Time: 07/06/20  1:29 PM  Result Value Ref Range   Fecal Occult Bld POSITIVE (A) NEGATIVE  Prepare RBC (crossmatch)     Status: None   Collection Time: 07/06/20  1:43 PM  Result Value Ref Range   Order Confirmation      ORDER PROCESSED BY BLOOD BANK Performed at [facility], [place] [address], [place], [place] [address]   Vitamin B12     Status: None   Collection Time: 07/06/20   1:45 PM  Result Value Ref Range   Vitamin B-12 193 180 - 914 pg/mL    Comment: (NOTE) This assay is not validated for testing neonatal or myeloproliferative syndrome specimens for Vitamin B12 levels. Performed at [facility], [place] [address], [place], [place] [address]   Iron and TIBC     Status: Abnormal   Collection Time: 07/06/20  1:45 PM  Result Value Ref Range   Iron 23 (L) 28 - 170 ug/dL   TIBC 490 (H) 250 - 450 ug/dL   Saturation Ratios 5 (L) 10.4 - 31.8 %   UIBC 467 ug/dL    Comment: Performed at [facility], [place] [address] [place], [place] [address]  Ferritin     Status: None   Collection Time: 07/06/20  1:45 PM  Result Value Ref Range   Ferritin 19 11 - 307 ng/mL    Comment: Performed at [facility] [address], [place],  [address]  Folate     Status: None   Collection Time: 07/06/20  1:45 PM  Result Value Ref Range   Folate 15.4 >5.9 ng/mL    Comment: Performed at [place] [address] [place], [place] [address]  Reticulocytes     Status: Abnormal   Collection Time: 07/06/20  3:23 PM  Result Value Ref Range   Retic Ct Pct 4.7 (H) 0.4 - 3.1 %   RBC. 1.52 (L) 3.87 - 5.11 MIL/uL   Retic Count, Absolute 71.6 19.0 - 186.0 K/uL   Immature Retic Fract 16.5 (H)  2.3 - 15.9 %    Comment: Performed at [facility], [place] [address] [place], [place] [address]   DG Ankle Complete Left  Result Date: 07/06/2020 CLINICAL DATA:  Soft tissue infection EXAM: LEFT ANKLE COMPLETE - 3+ VIEW COMPARISON:  None. FINDINGS: Soft tissue swelling at the ankle. No erosive changes. No periosteal reaction. Degenerative changes at the posterior talocalcaneal articulation. Joint spaces are otherwise preserved. IMPRESSION: Soft tissue swelling at the ankle. No radiographic evidence of osteomyelitis. Electronically Signed   By: Macy Mis M.D.   On: 07/06/2020 13:57   Review of Systems  Constitutional: Positive for activity change, appetite  change and fatigue. Negative for chills, diaphoresis, fever and unexpected weight change.  HENT: Negative.   Eyes: Negative.   Respiratory: Negative.   Cardiovascular: Positive for leg swelling. Negative for chest pain and palpitations.  Gastrointestinal: Positive for blood in stool and constipation. Negative for abdominal distention, abdominal pain, diarrhea, nausea and rectal pain.  Endocrine: Negative.   Genitourinary: Negative.   Musculoskeletal: Positive for arthralgias and myalgias.  Allergic/Immunologic: Negative.   Neurological: Negative.   Hematological: Negative.   Psychiatric/Behavioral: Positive for agitation and sleep disturbance.   Blood pressure (!) 161/76, pulse 75, temperature (!) 97.3 F (36.3 C), temperature source Oral, resp. rate (!) 22, SpO2 99 %. Physical Exam Vitals reviewed.  Constitutional:      Appearance: She is ill-appearing. She is not diaphoretic.  HENT:     Head: Normocephalic.     Mouth/Throat:     Mouth: Mucous membranes are dry.  Eyes:     Extraocular Movements: Extraocular movements intact.     Conjunctiva/sclera: Conjunctivae normal.     Pupils: Pupils are equal, round, and reactive to light.  Cardiovascular:     Rate and Rhythm: Normal rate and regular rhythm.     Heart sounds: Normal heart sounds.  Pulmonary:     Effort: Pulmonary effort is normal.     Breath sounds: Normal breath sounds.  Abdominal:     General: Abdomen is flat.     Palpations: Abdomen is soft.  Musculoskeletal:     Cervical back: Normal range of motion and neck supple.  Skin:    General: Skin is warm and dry.     Comments: Both lower extremities are swollen with blistering of the skin anteriorly with multiple ulcers above the ankles; I was not able to palpate any pulses in the feet  Neurological:     General: No focal deficit present.     Mental Status: She is alert and oriented to person, place, and time.  Psychiatric:        Mood and Affect: Mood normal.         Behavior: Behavior normal.        Thought Content: Thought content normal.        Judgment: Judgment normal.   Assessment/Plan: 1) Severe anemia with guaiac positive stools.  As per my discussion with the patient's daughter she is DNR and therefore her daughter does not feel she needs to prep for a colonoscopy at this time as she is limited in her mobility.  Therefore an EGD is planned for tomorrow further recommendation made thereafter.  Patient is to receive 2 units of packed red blood cells tonight 2) PVD/Acute cellulitis of the lower legs-with multiple ulcerations above the ankles.  On broad-spectrum antibiotics at the present time; wound care consult pending. 3) Essential hypertension. 4) Chronic kidney disease stage IIIB 5) Severe protein calorie  malnutrition. 6) DNR.  Juanita Craver 07/06/2020, 6:28 PM

## 2020-07-06 NOTE — ED Provider Notes (Signed)
[facility] EMERGENCY DEPARTMENT Provider Note   CSN: [id number] Arrival date & time: 07/06/20  6979     History Chief Complaint  Patient presents with  . Wound Check    Kristina [name] is a 86 y.o. female.  Patient with history of chronic kidney disease, hyperlipidemia, hypertension, peripheral arterial disease (2012), neg bilateral DVT study (04/2020) --presents the emergency department for evaluation of bilateral lower extremity swelling, redness, open wounds as well as decreased energy and weakness.  Patient has had swelling and weeping of her leg starting in August of this year.  She has home health nurse evaluating her weekly and providing dressing changes.  Patient's daughters also help with this.  They have noted increasing redness and swelling of the left lower extremity over the past several days in conjunction with worsening generalized weakness and activity level.  Patient lives at home by herself, family checks in on her.  Typically able to perform her ADLs.  No reported fevers.  Patient has been having diarrhea and taking Pepto-Bismol.  No visualized blood in the stool or history of GI bleeding.  Patient is not anticoagulated.         Past Medical History:  Diagnosis Date  . AAA (abdominal aortic aneurysm) (HCC)    Mild - measuring 2.8x2.8cm  . Acute hyponatremia 11/2016  . 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 HOH (hard of hearing)   . Hyperlipidemia    Well-controlled  . Hypertension   . PAD (peripheral artery disease) (HCC)    LEA Dopplers: Right external iliac 40%, right mid SFA 70-99% with occlusion in Hunter's canal. Angiography. LEFT common iliac 60%, left SFA 70-9%, popliteal 50%; RIGHT posterior tibial occluded;; carotid Dopplers in 2009: Less than 50% stenosis.  . Renal artery stenosis [facility])     Patient Active Problem List   Diagnosis Date Noted  .  Lt Hip fracture ([place]) 08/17/2017  . Fecal impaction in rectum ([place]) 08/17/2017  . Bilateral carotid bruits 07/18/2017  . Dizziness 12/09/2016  . Weakness 12/09/2016  . Malnutrition of moderate degree 12/03/2016  . Acute hyponatremia 12/01/2016  . Protein-calorie malnutrition ([place]) 12/01/2016  . Anemia 12/01/2016  . Pancreatic calcification 12/01/2016  . Abdominal pain, epigastric 12/01/2016  . Hyponatremia   . Claudication ([place]) 06/27/2016  . 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 Essential hypertension   . Hyperlipidemia with target low density lipoprotein (LDL) cholesterol less than 70 mg/dL   . Chronic kidney disease (CKD), stage III (moderate) (HCC)   . AAA (abdominal aortic aneurysm) [facility])     Past Surgical History:  Procedure Laterality Date  . INTRAMEDULLARY (IM) NAIL INTERTROCHANTERIC Left 08/18/2017   Procedure: INTRAMEDULLARY (IM) NAIL INTERTROCHANTRIC;  Surgeon: Leandrew Koyanagi, MD;  Location: [facility];  Service: Orthopedics;  Laterality: Left;  . LOWER EXTREMITY ARTERIAL DOPPLER  05/30/2011   Right EIA->50% daimeter reduction, Rt SFA 70-99% diameter reduction, Rt SFA-occlusive disease at Hunter's canal, Rt PTA-appeared occluded, Lft CIA-moderate amount of calcific plaque w/ >60% diameter reduction, Lft prox SFA 70-99% diameter reduction, Lft POP-large amount of irregular mixed suggesting <50% diameter reduction  . NM MYOVIEW LTD  06/25/2008   No scintigraphic evidence of inducible myocardial ischemia  . PERIPHERAL VASCULAR ANGIOGRAM  07/13/2009   No intervention - "plan on doing staged intervention  in near future"  . TRANSTHORACIC ECHOCARDIOGRAM  11/14/2007   EF >55%, mild mitral and tricuspid regurg.     OB History   No obstetric history on file.     Family History  Problem Relation Age of Onset  . Heart attack Brother   . Cancer Brother        Bone cancer     Social History   Tobacco Use  . Smoking status: Former Smoker    Types: Cigarettes    Quit date: 07/11/2001    Years since quitting: 19.0  . Smokeless tobacco: Never Used  Vaping Use  . Vaping Use: Never used  Substance Use Topics  . Alcohol use: Yes    Alcohol/week: 1.0 standard drink    Types: 1 Glasses of wine per week  . Drug use: No    Home Medications Prior to Admission medications   Medication Sig Start Date End Date Taking? Authorizing Provider  aspirin 81 MG tablet Take 81 mg by mouth daily.    [provider]  Coenzyme Q10 (COQ10) 100 MG CAPS Take 100 mg by mouth 3 (three) times daily with meals. Patient not taking: Reported on 12/09/2019 07/17/17   Leonie Man, MD  Docusate Sodium (COLACE PO) Take 1 tablet by mouth daily.    [provider]  enoxaparin (LOVENOX) 40 MG/0.4ML injection Inject 0.4 mLs (40 mg total) into the skin daily. Patient not taking: Reported on 12/09/2019 08/18/17   Leandrew Koyanagi, MD  feeding supplement, ENSURE ENLIVE, (ENSURE ENLIVE) LIQD Take 237 mLs by mouth 2 (two) times daily between meals. Patient not taking: Reported on 12/09/2019 08/21/17   Raiford Noble Latif, DO  hydrALAZINE (APRESOLINE) 50 MG tablet Take 50 mg by mouth 2 (two) times daily.    [provider]  HYDROcodone-acetaminophen (NORCO) 7.5-325 MG tablet Take 1-2 tablets by mouth every 6 (six) hours as needed for moderate pain. Patient not taking: Reported on 12/09/2019 08/18/17   Leandrew Koyanagi, MD  HYDROcodone-acetaminophen (NORCO/VICODIN) 5-325 MG tablet Take 1-2 tablets by mouth every 4 (four) hours as needed. 02/08/20   Lacretia Leigh, MD  pantoprazole (PROTONIX) 40 MG tablet Take 1 tablet (40 mg total) by mouth daily. Patient not taking: Reported on 12/09/2019 08/22/17   Raiford Noble Latif, DO  polyethylene glycol [facility] / GLYCOLAX) packet Take 17 g by mouth daily.    [provider]  Probiotic Product (ALIGN) 4 MG CAPS Take 4 mg by mouth  daily after lunch.     [provider]  Vitamin D, Ergocalciferol, (DRISDOL) 1.25 MG (50000 UNIT) CAPS capsule Take 50,000 Units by mouth every 7 (seven) days.    [provider]    Allergies    Lipitor [atorvastatin], Pletal [cilostazol], and Simvastatin  Review of Systems   Review of Systems  Constitutional: Positive for fatigue. Negative for fever.  HENT: Negative for rhinorrhea and sore throat.   Eyes: Negative for redness.  Respiratory: Negative for cough and shortness of breath.   Cardiovascular: Negative for chest pain.  Gastrointestinal: Negative for abdominal pain, diarrhea, nausea and vomiting.  Genitourinary: Negative for dysuria, frequency, hematuria and urgency.  Musculoskeletal: Negative for myalgias.  Skin: Positive for wound. Negative for rash.  Neurological: Positive for weakness. Negative for headaches.    Physical Exam Updated Vital Signs BP 126/63   Pulse 65   Temp (!) 97.4 F (36.3 C) (Oral)   Resp (!) 21   SpO2 97%   Physical Exam Vitals and nursing  note reviewed.  Constitutional:      General: She is not in acute distress.    Appearance: She is well-developed.  HENT:     Head: Normocephalic and atraumatic.     Right Ear: External ear normal.     Left Ear: External ear normal.     Nose: Nose normal.  Eyes:     Conjunctiva/sclera: Conjunctivae normal.  Cardiovascular:     Rate and Rhythm: Normal rate and regular rhythm.     Pulses:          Dorsalis pedis pulses are 1+ on the right side and 1+ on the left side.     Heart sounds: No murmur heard.   Pulmonary:     Effort: No respiratory distress.     Breath sounds: No wheezing, rhonchi or rales.  Abdominal:     Palpations: Abdomen is soft.     Tenderness: There is no abdominal tenderness. There is no guarding or rebound.  Musculoskeletal:     Cervical back: Normal range of motion and neck supple.     Right lower leg: No edema.     Left lower leg: Edema present.  Skin:     General: Skin is warm and dry.     Findings: No rash.     Comments: Right lower extremity: Mild swelling, no signs of cellulitis or skin breakdown.  Left lower extremity: There are cellulitic changes of the left foot, ankle, and lower leg.  There are open wounds anteriorly and medially with serous drainage.  Neurological:     General: No focal deficit present.     Mental Status: She is alert. Mental status is at baseline.     Motor: No weakness.  Psychiatric:        Mood and Affect: Mood normal.            ED Results / Procedures / Treatments   Labs (all labs ordered are listed, but only abnormal results are displayed) Labs Reviewed  COMPREHENSIVE METABOLIC PANEL - Abnormal; Notable for the following components:      Result Value   BUN 47 (*)    Creatinine, Ser 1.35 (*)    Total Protein 6.0 (*)    Albumin 3.1 (*)    GFR, Estimated 37 (*)    All other components within normal limits  CBC WITH DIFFERENTIAL/PLATELET - Abnormal; Notable for the following components:   WBC 12.2 (*)    RBC 1.81 (*)    Hemoglobin 5.2 (*)    HCT 18.0 (*)    MCHC 28.9 (*)    Platelets 433 (*)    Neutro Abs 10.6 (*)    Lymphs Abs 0.5 (*)    All other components within normal limits  POC OCCULT BLOOD, ED - Abnormal; Notable for the following components:   Fecal Occult Bld POSITIVE (*)    All other components within normal limits  CULTURE, BLOOD (ROUTINE X 2)  CULTURE, BLOOD (ROUTINE X 2)  RESP PANEL BY RT-PCR (FLU A&B, COVID) ARPGX2  LACTIC ACID, PLASMA  URINALYSIS, ROUTINE W REFLEX MICROSCOPIC  VITAMIN B12  IRON AND TIBC  FERRITIN  RETICULOCYTES  FOLATE  TYPE AND SCREEN  PREPARE RBC (CROSSMATCH)    EKG None  Radiology DG Ankle Complete Left  Result Date: 07/06/2020 CLINICAL DATA:  Soft tissue infection EXAM: LEFT ANKLE COMPLETE - 3+ VIEW COMPARISON:  None. FINDINGS: Soft tissue swelling at the ankle. No erosive changes. No periosteal reaction. Degenerative changes at the  posterior  talocalcaneal articulation. Joint spaces are otherwise preserved. IMPRESSION: Soft tissue swelling at the ankle. No radiographic evidence of osteomyelitis. Electronically Signed   By: Macy Mis M.D.   On: 07/06/2020 13:57    Procedures Procedures (including critical care time)  Medications Ordered in ED Medications  Ampicillin-Sulbactam (UNASYN) 3 g in sodium chloride 0.9 % 100 mL IVPB (has no administration in time range)  0.9 %  sodium chloride infusion (has no administration in time range)    ED Course  I have reviewed the triage vital signs and the nursing notes.  Pertinent labs & imaging results that were available during my care of the patient were reviewed by me and considered in my medical decision making (see chart for details).  Patient seen and examined. Work-up initiated. Patient's daughter at bedside. Will need admission for anemia evaluation and lower extremity cellulitis. Hemoccult performed with RN chaperone.   Vital signs reviewed and are as follows: BP 126/63   Pulse 65   Temp (!) 97.4 F (36.3 C) (Oral)   Resp (!) 21   SpO2 97%   1:43 PM Blood ordered. Pt stable.   2:27 PM No callback from GI ([facility] per daughter's request). I spoke with Dr. Tamala Julian of [facility] who will admit.   3:30 PM No callback from GI x 2.  CRITICAL CARE Performed by: Carlisle Cater PA-C Total critical care time: 35 minutes Critical care time was exclusive of separately billable procedures and treating other patients. Critical care was necessary to treat or prevent imminent or life-threatening deterioration. Critical care was time spent personally by me on the following activities: development of treatment plan with patient and/or surrogate as well as nursing, discussions with consultants, evaluation of patient's response to treatment, examination of patient, obtaining history from patient or surrogate, ordering and performing treatments and interventions, ordering and review  of laboratory studies, ordering and review of radiographic studies, pulse oximetry and re-evaluation of patient's condition.     MDM Rules/Calculators/A&P                          Admit.   Final Clinical Impression(s) / ED Diagnoses Final diagnoses:  Symptomatic anemia  Cellulitis of left lower extremity  Heme positive stool    Rx / DC Orders ED Discharge Orders    None       Carlisle Cater, PA-C 07/06/20 1530    Lucrezia Starch, MD 07/07/20 [phone]

## 2020-07-06 NOTE — H&P (View-Only) (Signed)
Reason for Consult: Severe anemia with guaiac positive stools. Referring Physician: [facility] hospitalist.  Kristina [name] is an 86 y.o. female.  HPI: Kristina [name] is a 86 year old white female with multiple medical problems listed below presents today emergency room today with swelling of her lower limbs and multiple ulcerations above her ankles along with weakness and dizziness.  On evaluation in the emergency room she was found to have a hemoglobin of 5.2 gms/dl.  Of late she has had worsening fatigue and has had multiple falls.  She has had home health visiting her for "weeping and swelling of the lower legs".  She was started on HCTZ but that has not helped her symptoms much.  GI consultation was requested for work-up of her anemia.  She had a colonoscopy in 2007 when she was noted to have extensive pandiverticular disease and hyperplastic polyps were  removed.  She has never had a follow-up surveillance colonoscopy after that.  She suffers from chronic constipation and takes MiraLAX on a as needed basis.  A few days ago she had some diarrhea for which she took Pepto-Bismol.  She denies having any melena hematochezia.  There is no known family history of colon cancer. CLARIFICATION: On reviewing the patient's chart, the PA who saw the patient earlier today, Carlisle Cater, documented that GI was called on several occasions but as per my discussion with Charise Carwin the nurse in charge in the ER this evening and my conversation with Dr. Fuller Plan who admitted the patient, we have NOT received any phone calls from the PA or the ER doctor about this patient. The nursing secretary who apparently was supposed to place the call to our office for the PA, Novella Olive, had left for the day and if I was not able to talk to her directly. The first contact about a consult on this patient was made by Dr. Tamala Julian the admitting [facility] hospitalist who called me.  I have been in touch with the patient daughter since  7 AM today.  I will notify Dr. Pattricia Boss the ER director about this discrepancy via E-mail.  Past Medical History:  Diagnosis Date  . AAA (abdominal aortic aneurysm) (HCC)    Mild - measuring 2.8x2.8cm  . Acute hyponatremia 11/2016  . 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 HOH (hard of hearing)   . Hyperlipidemia    Well-controlled  . Hypertension   . PAD (peripheral artery disease) (HCC)    LEA Dopplers: Right external iliac 40%, right mid SFA 70-99% with occlusion in Hunter's canal. Angiography. LEFT common iliac 60%, left SFA 70-9%, popliteal 50%; RIGHT posterior tibial occluded;; carotid Dopplers in 2009: Less than 50% stenosis.  . Renal artery stenosis [facility])    Past Surgical History:  Procedure Laterality Date  . INTRAMEDULLARY (IM) NAIL INTERTROCHANTERIC Left 08/18/2017   Procedure: INTRAMEDULLARY (IM) NAIL INTERTROCHANTRIC;  Surgeon: Leandrew Koyanagi, MD;  Location: [place];  Service: Orthopedics;  Laterality: Left;  . LOWER EXTREMITY ARTERIAL DOPPLER  05/30/2011   Right EIA->50% daimeter reduction, Rt SFA 70-99% diameter reduction, Rt SFA-occlusive disease at Hunter's canal, Rt PTA-appeared occluded, Lft CIA-moderate amount of calcific plaque w/ >60% diameter reduction, Lft prox SFA 70-99% diameter reduction, Lft POP-large amount of irregular mixed suggesting <50% diameter reduction  . NM MYOVIEW LTD  06/25/2008   No scintigraphic evidence of inducible myocardial ischemia  . PERIPHERAL VASCULAR ANGIOGRAM  07/13/2009   No intervention - "plan on doing staged intervention in near future"  . TRANSTHORACIC ECHOCARDIOGRAM  11/14/2007   EF >55%, mild mitral and tricuspid regurg.   Family History  Problem Relation Age of Onset  . Heart attack Brother   . Cancer Brother        Bone cancer   Social History:  reports that she quit smoking about 19 years ago. Her smoking use included cigarettes. She  has never used smokeless tobacco. She reports current alcohol use of about 1.0 standard drink of alcohol per week. She reports that she does not use drugs.  Allergies:  Allergies  Allergen Reactions  . Lipitor [Atorvastatin] Other (See Comments)    Reaction:  Unknown   . Pletal [Cilostazol] Other (See Comments)    Reaction:  Unknown   . Simvastatin     MYALGIAS   Medications: I have reviewed the patient's current medications.  Results for orders placed or performed during the hospital encounter of 07/06/20 (from the past 48 hour(s))  Lactic acid, plasma     Status: None   Collection Time: 07/06/20 10:26 AM  Result Value Ref Range   Lactic Acid, Venous 1.1 0.5 - 1.9 mmol/L    Comment: Performed at [facility], [address] [place], [place] [address]  Comprehensive metabolic panel     Status: Abnormal   Collection Time: 07/06/20 10:26 AM  Result Value Ref Range   Sodium 138 135 - 145 mmol/L   Potassium 4.5 3.5 - 5.1 mmol/L   Chloride 102 98 - 111 mmol/L   CO2 24 22 - 32 mmol/L   Glucose, Bld 93 70 - 99 mg/dL    Comment: Glucose reference range applies only to samples taken after fasting for at least 8 hours.   BUN 47 (H) 8 - 23 mg/dL   Creatinine, Ser 1.35 (H) 0.44 - 1.00 mg/dL   Calcium 9.2 8.9 - 10.3 mg/dL   Total Protein 6.0 (L) 6.5 - 8.1 g/dL   Albumin 3.1 (L) 3.5 - 5.0 g/dL   AST 32 15 - 41 U/L   ALT 27 0 - 44 U/L   Alkaline Phosphatase 80 38 - 126 U/L   Total Bilirubin 0.6 0.3 - 1.2 mg/dL   GFR, Estimated 37 (L) >60 mL/min    Comment: (NOTE) Calculated using the CKD-EPI Creatinine Equation (2021)    Anion gap 12 5 - 15    Comment: Performed at [facility], [place] [address], [place], [place] [address]  CBC with Differential     Status: Abnormal   Collection Time: 07/06/20 10:26 AM  Result Value Ref Range   WBC 12.2 (H) 4.0 - 10.5 K/uL   RBC 1.81 (L) 3.87 - 5.11 MIL/uL   Hemoglobin 5.2 (LL) 12.0 - 15.0 g/dL    Comment: REPEATED TO VERIFY THIS  CRITICAL RESULT HAS VERIFIED AND BEEN CALLED TO GROSE,C,RN BY PHYLLIS GWYN ON 12 06 2021 AT 1148, AND HAS BEEN READ BACK.     HCT 18.0 (L) 36 - 46 %   MCV 99.4 80.0 - 100.0 fL   MCH 28.7 26.0 - 34.0 pg   MCHC 28.9 (L) 30.0 - 36.0 g/dL   RDW 15.2 11.5 - 15.5 %   Platelets 433 (H) 150 - 400 K/uL   nRBC 0.0 0.0 - 0.2 %   Neutrophils Relative % 87 %   Neutro Abs 10.6 (H) 1.7 - 7.7 K/uL   Lymphocytes Relative 4 %  Lymphs Abs 0.5 (L) 0.7 - 4.0 K/uL   Monocytes Relative 8 %   Monocytes Absolute 1.0 0.1 - 1.0 K/uL   Eosinophils Relative 0 %   Eosinophils Absolute 0.0 0.0 - 0.5 K/uL   Basophils Relative 0 %   Basophils Absolute 0.0 0.0 - 0.1 K/uL   Immature Granulocytes 1 %   Abs Immature Granulocytes 0.07 0.00 - 0.07 K/uL    Comment: Performed at [facility] [address], [place], [place] [address]  Type and screen     Status: None (Preliminary result)   Collection Time: 07/06/20  1:00 PM  Result Value Ref Range   ABO/RH(D) A POS    Antibody Screen NEG    Sample Expiration 07/09/2020,2359    Unit Number [id number]    Blood Component Type RED CELLS,LR    Unit division 00    Status of Unit ALLOCATED    Transfusion Status OK TO TRANSFUSE    Crossmatch Result Compatible    Unit Number [id number]    Blood Component Type RED CELLS,LR    Unit division 00    Status of Unit ISSUED    Transfusion Status OK TO TRANSFUSE    Crossmatch Result      Compatible Performed at [facility], [place] [address], [place], [place] [address]   POC occult blood, ED     Status: Abnormal   Collection Time: 07/06/20  1:29 PM  Result Value Ref Range   Fecal Occult Bld POSITIVE (A) NEGATIVE  Prepare RBC (crossmatch)     Status: None   Collection Time: 07/06/20  1:43 PM  Result Value Ref Range   Order Confirmation      ORDER PROCESSED BY BLOOD BANK Performed at [facility], [place] [address], [place], [place] [address]   Vitamin B12     Status: None   Collection Time: 07/06/20   1:45 PM  Result Value Ref Range   Vitamin B-12 193 180 - 914 pg/mL    Comment: (NOTE) This assay is not validated for testing neonatal or myeloproliferative syndrome specimens for Vitamin B12 levels. Performed at [facility], [place] [address] [place], [place] [address]   Iron and TIBC     Status: Abnormal   Collection Time: 07/06/20  1:45 PM  Result Value Ref Range   Iron 23 (L) 28 - 170 ug/dL   TIBC 490 (H) 250 - 450 ug/dL   Saturation Ratios 5 (L) 10.4 - 31.8 %   UIBC 467 ug/dL    Comment: Performed at [facility], [place] [address], [place], [place] [address]  Ferritin     Status: None   Collection Time: 07/06/20  1:45 PM  Result Value Ref Range   Ferritin 19 11 - 307 ng/mL    Comment: Performed at [place] [address], [place], [place] [address]  Folate     Status: None   Collection Time: 07/06/20  1:45 PM  Result Value Ref Range   Folate 15.4 >5.9 ng/mL    Comment: Performed at [facility] [address], [place], [place] [address]  Reticulocytes     Status: Abnormal   Collection Time: 07/06/20  3:23 PM  Result Value Ref Range   Retic Ct Pct 4.7 (H) 0.4 - 3.1 %   RBC. 1.52 (L) 3.87 - 5.11 MIL/uL   Retic Count, Absolute 71.6 19.0 - 186.0 K/uL   Immature Retic Fract 16.5 (H)  2.3 - 15.9 %    Comment: Performed at [facility], [place] [address] [place], [place] [address]   DG Ankle Complete Left  Result Date: 07/06/2020 CLINICAL DATA:  Soft tissue infection EXAM: LEFT ANKLE COMPLETE - 3+ VIEW COMPARISON:  None. FINDINGS: Soft tissue swelling at the ankle. No erosive changes. No periosteal reaction. Degenerative changes at the posterior talocalcaneal articulation. Joint spaces are otherwise preserved. IMPRESSION: Soft tissue swelling at the ankle. No radiographic evidence of osteomyelitis. Electronically Signed   By: Macy Mis M.D.   On: 07/06/2020 13:57   Review of Systems  Constitutional: Positive for activity change, appetite  change and fatigue. Negative for chills, diaphoresis, fever and unexpected weight change.  HENT: Negative.   Eyes: Negative.   Respiratory: Negative.   Cardiovascular: Positive for leg swelling. Negative for chest pain and palpitations.  Gastrointestinal: Positive for blood in stool and constipation. Negative for abdominal distention, abdominal pain, diarrhea, nausea and rectal pain.  Endocrine: Negative.   Genitourinary: Negative.   Musculoskeletal: Positive for arthralgias and myalgias.  Allergic/Immunologic: Negative.   Neurological: Negative.   Hematological: Negative.   Psychiatric/Behavioral: Positive for agitation and sleep disturbance.   Blood pressure (!) 161/76, pulse 75, temperature (!) 97.3 F (36.3 C), temperature source Oral, resp. rate (!) 22, SpO2 99 %. Physical Exam Vitals reviewed.  Constitutional:      Appearance: She is ill-appearing. She is not diaphoretic.  HENT:     Head: Normocephalic.     Mouth/Throat:     Mouth: Mucous membranes are dry.  Eyes:     Extraocular Movements: Extraocular movements intact.     Conjunctiva/sclera: Conjunctivae normal.     Pupils: Pupils are equal, round, and reactive to light.  Cardiovascular:     Rate and Rhythm: Normal rate and regular rhythm.     Heart sounds: Normal heart sounds.  Pulmonary:     Effort: Pulmonary effort is normal.     Breath sounds: Normal breath sounds.  Abdominal:     General: Abdomen is flat.     Palpations: Abdomen is soft.  Musculoskeletal:     Cervical back: Normal range of motion and neck supple.  Skin:    General: Skin is warm and dry.     Comments: Both lower extremities are swollen with blistering of the skin anteriorly with multiple ulcers above the ankles; I was not able to palpate any pulses in the feet  Neurological:     General: No focal deficit present.     Mental Status: She is alert and oriented to person, place, and time.  Psychiatric:        Mood and Affect: Mood normal.         Behavior: Behavior normal.        Thought Content: Thought content normal.        Judgment: Judgment normal.   Assessment/Plan: 1) Severe anemia with guaiac positive stools.  As per my discussion with the patient's daughter she is DNR and therefore her daughter does not feel she needs to prep for a colonoscopy at this time as she is limited in her mobility.  Therefore an EGD is planned for tomorrow further recommendation made thereafter.  Patient is to receive 2 units of packed red blood cells tonight 2) PVD/Acute cellulitis of the lower legs-with multiple ulcerations above the ankles.  On broad-spectrum antibiotics at the present time; wound care consult pending. 3) Essential hypertension. 4) Chronic kidney disease stage IIIB 5) Severe protein calorie  malnutrition. 6) DNR.  Juanita Craver 07/06/2020, 6:28 PM

## 2020-07-06 NOTE — H&P (Addendum)
History and Physical    Kristina [name] IEP:[id number] DOB: 03-24-1926 DOA: 07/06/2020  Referring MD/NP/PA: Alecia Lemming, PA-C PCP: [facility], [place]  Patient coming from: Home  Chief Complaint: Wound to the left leg and decrease energy  I have personally briefly reviewed patient's old medical records in [place]   HPI: Kristina [name] is a 86 y.o. female with medical history significant of HTN, HLD, chronic anemia CKD stage III with solitary kidney, AAA, and PAD presents for worsening wound of the left leg and decreased energy.  History is obtained from the patient with assistance of her daughter who is present at bedside. At home the patient normally is able to complete all of her ADLs and family comes checking on her routine basis.  The patient has been dealing with swelling and weeping of her legs since August of this year.  Patient has home health who comes out to check on the wound and family helps with dressing changes.  The left leg reportedly looked worse when the patient's daughter looked at it 3 days ago.  Daughter noted that the patient had been more fatigued and weaker lately.  Noted associated symptoms of diarrhea and dark stools.  However, patient had recently taken Pepto-Bismol for her symptoms.  For the pain in her legs she does admit to occasionally taking Aleve.  Denied having any recent fevers.  Patient's last colonoscopy was over 10 years ago.  ED Course: Upon admission into the emergency department patient was seen to be normal, respiration 14-23, and all other vital signs maintained.  Labs significant for WBC 12.2, hemoglobin 5.2(hemoglobin 8.8 on 10/5), platelets 433, BUN 47, and creatinine 1.35, albumin 3.1, and lactic acid 1.1.  Stool guaiacs were noted to be positive.  Patient was typed and screened and ordered to be transfused 2 units of packed red blood cells.  X-rays of the right ankle revealed soft tissue swelling with no signs of bone involvement.   Patient was started empirically on Unasyn.  Review of Systems  Constitutional: Positive for malaise/fatigue. Negative for fever.  HENT: Negative for congestion and nosebleeds.   Eyes: Negative for photophobia and pain.  Respiratory: Negative for cough and shortness of breath.   Cardiovascular: Positive for leg swelling. Negative for chest pain.  Gastrointestinal: Negative for abdominal pain, nausea and vomiting.  Genitourinary: Negative for dysuria and hematuria.  Musculoskeletal: Negative for joint pain and myalgias.  Skin:       Positive for skin color change  Neurological: Positive for weakness. Negative for loss of consciousness.  Psychiatric/Behavioral: Negative for substance abuse.    Past Medical History:  Diagnosis Date  . AAA (abdominal aortic aneurysm) (HCC)    Mild - measuring 2.8x2.8cm  . Acute hyponatremia 11/2016  . 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 HOH (hard of hearing)   . Hyperlipidemia    Well-controlled  . Hypertension   . PAD (peripheral artery disease) (HCC)    LEA Dopplers: Right external iliac 40%, right mid SFA 70-99% with occlusion in Hunter's canal. Angiography. LEFT common iliac 60%, left SFA 70-9%, popliteal 50%; RIGHT posterior tibial occluded;; carotid Dopplers in 2009: Less than 50% stenosis.  . Renal artery stenosis [facility])     Past Surgical History:  Procedure Laterality Date  . INTRAMEDULLARY (IM) NAIL INTERTROCHANTERIC Left 08/18/2017   Procedure: INTRAMEDULLARY (IM) NAIL INTERTROCHANTRIC;  Surgeon: Leandrew Koyanagi, MD;  Location: [facility];  Service: Orthopedics;  Laterality: Left;  . LOWER EXTREMITY ARTERIAL DOPPLER  05/30/2011   Right EIA->50% daimeter reduction, Rt SFA 70-99% diameter reduction, Rt SFA-occlusive disease at Hunter's canal, Rt PTA-appeared occluded, Lft CIA-moderate amount of calcific plaque w/ >60% diameter reduction, Lft prox SFA 70-99%  diameter reduction, Lft POP-large amount of irregular mixed suggesting <50% diameter reduction  . NM MYOVIEW LTD  06/25/2008   No scintigraphic evidence of inducible myocardial ischemia  . PERIPHERAL VASCULAR ANGIOGRAM  07/13/2009   No intervention - "plan on doing staged intervention in near future"  . TRANSTHORACIC ECHOCARDIOGRAM  11/14/2007   EF >55%, mild mitral and tricuspid regurg.     reports that she quit smoking about 19 years ago. Her smoking use included cigarettes. She has never used smokeless tobacco. She reports current alcohol use of about 1.0 standard drink of alcohol per week. She reports that she does not use drugs.  Allergies  Allergen Reactions  . Lipitor [Atorvastatin] Other (See Comments)    Reaction:  Unknown   . Pletal [Cilostazol] Other (See Comments)    Reaction:  Unknown   . Simvastatin     MYALGIAS    Family History  Problem Relation Age of Onset  . Heart attack Brother   . Cancer Brother        Bone cancer    Prior to Admission medications   Medication Sig Start Date End Date Taking? Authorizing Provider  aspirin 81 MG tablet Take 81 mg by mouth daily.    [provider]  Coenzyme Q10 (COQ10) 100 MG CAPS Take 100 mg by mouth 3 (three) times daily with meals. Patient not taking: Reported on 12/09/2019 07/17/17   Leonie Man, MD  Docusate Sodium (COLACE PO) Take 1 tablet by mouth daily.    [provider]  enoxaparin (LOVENOX) 40 MG/0.4ML injection Inject 0.4 mLs (40 mg total) into the skin daily. Patient not taking: Reported on 12/09/2019 08/18/17   Leandrew Koyanagi, MD  feeding supplement, ENSURE ENLIVE, (ENSURE ENLIVE) LIQD Take 237 mLs by mouth 2 (two) times daily between meals. Patient not taking: Reported on 12/09/2019 08/21/17   Raiford Noble Latif, DO  hydrALAZINE (APRESOLINE) 50 MG tablet Take 50 mg by mouth 2 (two) times daily.    [provider]  HYDROcodone-acetaminophen (NORCO) 7.5-325 MG tablet Take 1-2 tablets by  mouth every 6 (six) hours as needed for moderate pain. Patient not taking: Reported on 12/09/2019 08/18/17   Leandrew Koyanagi, MD  HYDROcodone-acetaminophen (NORCO/VICODIN) 5-325 MG tablet Take 1-2 tablets by mouth every 4 (four) hours as needed. 02/08/20   Lacretia Leigh, MD  pantoprazole (PROTONIX) 40 MG tablet Take 1 tablet (40 mg total) by mouth daily. Patient not taking: Reported on 12/09/2019 08/22/17   Raiford Noble Latif, DO  polyethylene glycol [facility] / GLYCOLAX) packet Take 17 g by mouth daily.    [provider]  Probiotic Product (ALIGN) 4 MG CAPS Take 4 mg by mouth daily after lunch.     [provider]  Vitamin D, Ergocalciferol, (DRISDOL) 1.25 MG (50000 UNIT) CAPS capsule Take 50,000 Units by mouth every 7 (seven) days.    [provider]    Physical Exam:  Constitutional: Elderly female who appears to be in no acute distress at this Vitals:   07/06/20 0937 07/06/20 1241 07/06/20 1300  BP: 131/60 (!) 149/49 126/63  Pulse: 82 75 65  Resp: 14 17 (!) 21  Temp: (!) 97.4 F (36.3  C)    TempSrc: Oral    SpO2: 93% 92% 97%   Eyes: PERRL, lids and conjunctivae normal ENMT: Mucous membranes are dry. Posterior pharynx clear of any exudate or lesions.  Patient hard of hearing Neck: normal, supple, no masses, no thyromegaly Respiratory: clear to auscultation bilaterally, no wheezing, no crackles. Normal respiratory effort. No accessory muscle use.  Cardiovascular: Regular rate and rhythm, no murmurs / rubs / gallops.  Lower extremity edema appreciated bilateral. No carotid bruits.  Abdomen: no tenderness, no masses palpated. No hepatosplenomegaly. Bowel sounds positive.  Musculoskeletal: no clubbing / cyanosis. No joint deformity upper and lower extremities. Good ROM, no contractures. Normal muscle tone.  Skin: Erythema noted of the left leg with open wound and drainage as seen below   Neurologic: CN 2-12 grossly intact. Sensation intact, DTR normal. Strength  5/5 in all 4.  Psychiatric: Normal judgment and insight. Alert and oriented x 3. Normal mood.     Labs on Admission: I have personally reviewed following labs and imaging studies  CBC: Recent Labs  Lab 07/06/20 1026  WBC 12.2*  NEUTROABS 10.6*  HGB 5.2*  HCT 18.0*  MCV 99.4  PLT 144*   Basic Metabolic Panel: Recent Labs  Lab 07/06/20 1026  NA 138  K 4.5  CL 102  CO2 24  GLUCOSE 93  BUN 47*  CREATININE 1.35*  CALCIUM 9.2   GFR: CrCl cannot be calculated (Unknown ideal weight.). Liver Function Tests: Recent Labs  Lab 07/06/20 1026  AST 32  ALT 27  ALKPHOS 80  BILITOT 0.6  PROT 6.0*  ALBUMIN 3.1*   No results for input(s): LIPASE, AMYLASE in the last 168 hours. No results for input(s): AMMONIA in the last 168 hours. Coagulation Profile: No results for input(s): INR, PROTIME in the last 168 hours. Cardiac Enzymes: No results for input(s): CKTOTAL, CKMB, CKMBINDEX, TROPONINI in the last 168 hours. BNP (last 3 results) No results for input(s): PROBNP in the last 8760 hours. HbA1C: No results for input(s): HGBA1C in the last 72 hours. CBG: No results for input(s): GLUCAP in the last 168 hours. Lipid Profile: No results for input(s): CHOL, HDL, LDLCALC, TRIG, CHOLHDL, LDLDIRECT in the last 72 hours. Thyroid Function Tests: No results for input(s): TSH, T4TOTAL, FREET4, T3FREE, THYROIDAB in the last 72 hours. Anemia Panel: No results for input(s): VITAMINB12, FOLATE, FERRITIN, TIBC, IRON, RETICCTPCT in the last 72 hours. Urine analysis:    Component Value Date/Time   COLORURINE YELLOW 08/19/2017 2118   APPEARANCEUR CLEAR 08/19/2017 2118   LABSPEC 1.021 08/19/2017 2118   PHURINE 5.0 08/19/2017 2118   GLUCOSEU NEGATIVE 08/19/2017 2118   HGBUR SMALL (A) 08/19/2017 2118   BILIRUBINUR NEGATIVE 08/19/2017 2118   Bosque NEGATIVE 08/19/2017 2118   PROTEINUR NEGATIVE 08/19/2017 2118   NITRITE NEGATIVE 08/19/2017 2118   LEUKOCYTESUR NEGATIVE 08/19/2017 2118    Sepsis Labs: No results found for this or any previous visit (from the past 240 hour(s)).   Radiological Exams on Admission: DG Ankle Complete Left  Result Date: 07/06/2020 CLINICAL DATA:  Soft tissue infection EXAM: LEFT ANKLE COMPLETE - 3+ VIEW COMPARISON:  None. FINDINGS: Soft tissue swelling at the ankle. No erosive changes. No periosteal reaction. Degenerative changes at the posterior talocalcaneal articulation. Joint spaces are otherwise preserved. IMPRESSION: Soft tissue swelling at the ankle. No radiographic evidence of osteomyelitis. Electronically Signed   By: Macy Mis M.D.   On: 07/06/2020 13:57    EKG: Independently reviewed.  Sinus rhythm at 69 bpm  with premature atrial complex  Assessment/Plan   Sepsis secondary to cellulitis of the left leg: Acute.  Patient has been dealing with nonhealing wound of the left leg reportedly since August.  Home health had been monitoring the wound, but over the last couple of days had started to look more infected.  On physical exam significant erythema appreciated with open wound and drainage.  Patient had an elevated respiratory rate and WBC of 12.2 meeting sepsis criteria, but there were no signs of endorgan damage as  lactic acid was reassuring at 1.1.  Blood cultures had not been initially obtained. -Admit to medical telemetry -Continue empiric antibiotics of cefazolin IV -Wound care consult  Symptomatic anemia secondary to GI bleed: Patient presented with complaints of weakness and fatigue.  Found to have a hemoglobin of 5.2 g/dL down from 8.8 g/dL in 05/2020.  Stool guaiacs were noted to be positive.  Patient had been typed and screened and ordered transfused 2 units of packed red blood.  Would suspect possibly an upper GI bleed given elevated BUN to creatinine ratio and because of patient's intermittently use of Aleve. -Clear liquid diet -Hold aspirin, and discussed risk of ulcers from using Aleve -Protonix IV twice  daily -Continue with transfusion of 2 units of packed red blood cell -Check H&H posttransfusion -Transfuse blood products as needed for hemoglobins less than 8 g/dL -Consulted Dr. Collene Mares of GI, will follow-up for any further recommendations  Leukocytosis: WBC elevated at 12.2.  Suspect secondary to underlying infection and/or GI bleed. -Continue to monitor  Essential hypertension: Blood pressures noted to be elevated at 161/76.  Home medications include hydralazine 50 mg twice daily for -Continue hydralazine  Chronic kidney disease stage IIIB: Patient's baseline creatinine appears to range from 1.1-1.3.  On admission creatinine 1.35 with BUN of 47.  Given the elevated BUN suspect likely related with patient's blood loss. -Continue to monitor kidney function  Protein calorie malnutrition: On admission albumin seen to be 3.1. -Add-on prealbumin  Peripheral vascular disease -Held aspirin due to bleed  DNR: Present on admission.  DVT prophylaxis: SCDs  Code Status: DNR Family Communication: Daughter updated at that Disposition Plan: Hopefully discharge home once medically stable Consults called: GI, wound care, Admission status: Impaired  Norval Morton MD [facility] Pager [phone]   If 7PM-7AM, please contact night-coverage [email or web address] Password [email or web address]  07/06/2020, 2:23 PM

## 2020-07-06 NOTE — ED Notes (Addendum)
Pt not in room. Will obtain VS upon arrival to room.

## 2020-07-06 NOTE — ED Triage Notes (Signed)
Pt arrives to ED with daughter with a chief complaint chronic poor healing wound on bilateral lower legs.  Pt has chronic weeping and open wounds to both legs being followed by wound care and home health. Daughter reports worsening in weeping and pain here today to have this evaluated.

## 2020-07-06 NOTE — ED Notes (Signed)
Dinner Trays Ordered @ [phone].

## 2020-07-06 NOTE — Progress Notes (Signed)
New Admission Note:  Arrival Method: Stretcher Mental Orientation: Alert and oriented x 3-4. Very Hard of hearing Telemetry: Box 1  Assessment: Completed Skin: Warm and dry. Anasarca. BLE edema L>R. Wound to LLE IV: NSLs Pain: Denies Tubes: N/A Safety Measures: Safety Fall Prevention Plan initiated.  Admission: Completed 5 M  Orientation: Patient has been orientated to the room, unit and the staff. Welcome booklet given.  Family: None  Orders have been reviewed and implemented. Will continue to monitor the patient. Call light has been placed within reach and bed alarm has been activated.   Sima Matas BSN, RN  Phone Number: [phone]

## 2020-07-07 ENCOUNTER — Encounter (HOSPITAL_COMMUNITY)
Admission: EM | Disposition: A | Discharge status: Skilled Nursing Facility | Payer: Self-pay | Source: Home / Self Care | Attending: Internal Medicine

## 2020-07-07 ENCOUNTER — Encounter (HOSPITAL_COMMUNITY): Payer: Self-pay | Admitting: Internal Medicine

## 2020-07-07 ENCOUNTER — Inpatient Hospital Stay (HOSPITAL_COMMUNITY): Payer: Medicare Other | Admitting: Certified Registered"

## 2020-07-07 HISTORY — PX: BIOPSY: SHX5522

## 2020-07-07 HISTORY — PX: ESOPHAGOGASTRODUODENOSCOPY: SHX5428

## 2020-07-07 LAB — TYPE AND SCREEN
ABO/RH(D): A POS
Antibody Screen: NEGATIVE
Unit division: 0
Unit division: 0

## 2020-07-07 LAB — BASIC METABOLIC PANEL
Anion gap: 9 (ref 5–15)
BUN: 48 mg/dL — ABNORMAL HIGH (ref 8–23)
CO2: 25 mmol/L (ref 22–32)
Calcium: 8.6 mg/dL — ABNORMAL LOW (ref 8.9–10.3)
Chloride: 104 mmol/L (ref 98–111)
Creatinine, Ser: 1.47 mg/dL — ABNORMAL HIGH (ref 0.44–1.00)
GFR, Estimated: 33 mL/min — ABNORMAL LOW (ref >60)
Glucose, Bld: 163 mg/dL — ABNORMAL HIGH (ref 70–99)
Potassium: 4.4 mmol/L (ref 3.5–5.1)
Sodium: 138 mmol/L (ref 135–145)

## 2020-07-07 LAB — BPAM RBC
Blood Product Expiration Date: 202201032359
Blood Product Expiration Date: 202201032359
ISSUE DATE / TIME: 202112061646
ISSUE DATE / TIME: 202112062047
Unit Type and Rh: 6200
Unit Type and Rh: 6200

## 2020-07-07 LAB — CBC
HCT: 24.6 % — ABNORMAL LOW (ref 36.0–46.0)
Hemoglobin: 7.7 g/dL — ABNORMAL LOW (ref 12.0–15.0)
MCH: 28.9 pg (ref 26.0–34.0)
MCHC: 31.3 g/dL (ref 30.0–36.0)
MCV: 92.5 fL (ref 80.0–100.0)
Platelets: 324 10*3/uL (ref 150–400)
RBC: 2.66 MIL/uL — ABNORMAL LOW (ref 3.87–5.11)
RDW: 16.2 % — ABNORMAL HIGH (ref 11.5–15.5)
WBC: 9.1 10*3/uL (ref 4.0–10.5)
nRBC: 0 % (ref 0.0–0.2)

## 2020-07-07 LAB — PREALBUMIN: Prealbumin: 16.4 mg/dL — ABNORMAL LOW (ref 18–38)

## 2020-07-07 LAB — GLUCOSE, CAPILLARY: Glucose-Capillary: 80 mg/dL (ref 70–99)

## 2020-07-07 SURGERY — EGD (ESOPHAGOGASTRODUODENOSCOPY)
Anesthesia: Monitor Anesthesia Care | Laterality: Left

## 2020-07-07 MED ORDER — ENSURE ENLIVE PO LIQD
237.0000 mL | Freq: Two times a day (BID) | ORAL | Status: DC
  Administered 2020-07-09: 237 mL via ORAL

## 2020-07-07 MED ORDER — ONDANSETRON HCL 4 MG/2ML IJ SOLN
INTRAMUSCULAR | Status: AC
  Filled 2020-07-07: qty 2

## 2020-07-07 MED ORDER — PROPOFOL 500 MG/50ML IV EMUL
INTRAVENOUS | Status: DC | PRN
  Administered 2020-07-07: 100 ug/kg/min via INTRAVENOUS

## 2020-07-07 MED ORDER — PANTOPRAZOLE SODIUM 40 MG PO TBEC: 40.0000 mg | DELAYED_RELEASE_TABLET | Freq: Every day | ORAL | Status: DC

## 2020-07-07 MED ORDER — DOXYCYCLINE HYCLATE 100 MG PO TABS
100.0000 mg | ORAL_TABLET | Freq: Two times a day (BID) | ORAL | Status: DC
  Administered 2020-07-07 – 2020-07-13 (×13): 100 mg via ORAL
  Filled 2020-07-07 (×13): qty 1

## 2020-07-07 MED ORDER — PANTOPRAZOLE SODIUM 40 MG PO TBEC
40.0000 mg | DELAYED_RELEASE_TABLET | Freq: Two times a day (BID) | ORAL | Status: DC
  Administered 2020-07-08 – 2020-07-13 (×12): 40 mg via ORAL
  Filled 2020-07-07 (×12): qty 1

## 2020-07-07 MED ORDER — HYDRALAZINE HCL 25 MG PO TABS
25.0000 mg | ORAL_TABLET | Freq: Two times a day (BID) | ORAL | Status: DC
  Administered 2020-07-07 – 2020-07-13 (×13): 25 mg via ORAL
  Filled 2020-07-07 (×13): qty 1

## 2020-07-07 MED ORDER — CEFAZOLIN SODIUM-DEXTROSE 1-4 GM/50ML-% IV SOLN
1.0000 g | Freq: Two times a day (BID) | INTRAVENOUS | Status: DC
  Administered 2020-07-08 – 2020-07-13 (×10): 1 g via INTRAVENOUS
  Filled 2020-07-07 (×10): qty 50

## 2020-07-07 NOTE — Op Note (Signed)
[facility] Patient Name: Kristina [name] Procedure Date : 07/07/2020 MRN: [id number] Attending MD: Carol Ada , MD Date of Birth: 11/05/1925 CSN: [id number] Age: 86 Admit Type: Inpatient Procedure:                Upper GI endoscopy Indications:              Iron deficiency anemia, Heme positive stool Providers:                Carol Ada, MD, Tyna Jaksch Technician, Wynonia Sours, RN Referring MD:              Medicines:                Propofol per Anesthesia Complications:            No immediate complications. Estimated Blood Loss:     Estimated blood loss was minimal. Procedure:                Pre-Anesthesia Assessment:                           - Prior to the procedure, a History and Physical                            was performed, and patient medications and                            allergies were reviewed. The patient's tolerance of                            previous anesthesia was also reviewed. The risks                            and benefits of the procedure and the sedation                            options and risks were discussed with the patient.                            All questions were answered, and informed consent                            was obtained. Prior Anticoagulants: The patient has                            taken no previous anticoagulant or antiplatelet                            agents. ASA Grade Assessment: III - A patient with                            severe systemic disease. After reviewing the risks  and benefits, the patient was deemed in                            satisfactory condition to undergo the procedure.                           - Sedation was administered by an anesthesia                            professional. Deep sedation was attained.                           After obtaining informed consent, the endoscope was                            passed under  direct vision. Throughout the                            procedure, the patient's blood pressure, pulse, and                            oxygen saturations were monitored continuously. The                            GIF-H190 ([id number]) Olympus gastroscope was                            introduced through the mouth, and advanced to the                            second part of duodenum. The upper GI endoscopy was                            accomplished without difficulty. The patient                            tolerated the procedure well. Scope In: Scope Out: Findings:      One benign-appearing, intrinsic mild stenosis was found at the       gastroesophageal junction. This stenosis measured 1.3 cm (inner       diameter) x less than one cm (in length). The stenosis was traversed.      A 2 cm hiatal hernia was present.      The entire examined stomach was normal. Biopsies were taken with a cold       forceps for Helicobacter pylori testing.      Few non-bleeding cratered, linear and superficial duodenal ulcers with       no stigmata of bleeding were found in the second portion of the       duodenum. The largest lesion was 2 mm in largest dimension. Impression:               - Benign-appearing esophageal stenosis.                           - 2 cm hiatal hernia.                           -  Normal stomach. Biopsied.                           - Non-bleeding duodenal ulcers with no stigmata of                            bleeding. Recommendation:           - Return patient to hospital ward for ongoing care.                           - Resume regular diet.                           - Continue present medications.                           - Await pathology results.                           - Change over from IV to PO Pantoprazole.                           - Follow HGB and transfuse as necessary.                           - If stable she can follow up in the office in 2                             weeks. Procedure Code(s):        --- Professional ---                           [phone], Esophagogastroduodenoscopy, flexible,                            transoral; with biopsy, single or multiple Diagnosis Code(s):        --- Professional ---                           K26.9, Duodenal ulcer, unspecified as acute or                            chronic, without hemorrhage or perforation                           K22.2, Esophageal obstruction                           K44.9, Diaphragmatic hernia without obstruction or                            gangrene                           D50.9, Iron deficiency anemia, unspecified  R19.5, Other fecal abnormalities CPT copyright 2019 American Medical Association. All rights reserved. The codes documented in this report are preliminary and upon coder review may  be revised to meet current compliance requirements. Carol Ada, MD Carol Ada, MD 07/07/2020 3:02:19 PM This report has been signed electronically. Number of Addenda: 0

## 2020-07-07 NOTE — Anesthesia Preprocedure Evaluation (Signed)
Anesthesia Evaluation  Patient identified by MRN, date of birth, ID band Patient awake    Reviewed: Allergy & Precautions, H&P , NPO status , Patient's Chart, lab work & pertinent test results  Airway Mallampati: II   Neck ROM: full    Dental   Pulmonary former smoker,    breath sounds clear to auscultation       Cardiovascular hypertension, + Peripheral Vascular Disease   Rhythm:regular Rate:Normal  AAA   Neuro/Psych    GI/Hepatic   Endo/Other    Renal/GU Renal InsufficiencyRenal disease     Musculoskeletal   Abdominal   Peds  Hematology  (+) Blood dyscrasia, anemia ,   Anesthesia Other Findings   Reproductive/Obstetrics                             Anesthesia Physical Anesthesia Plan  ASA: III  Anesthesia Plan: MAC   Post-op Pain Management:    Induction: Intravenous  PONV Risk Score and Plan: 2 and Propofol infusion and Treatment may vary due to age or medical condition  Airway Management Planned: Nasal Cannula  Additional Equipment:   Intra-op Plan:   Post-operative Plan:   Informed Consent: I have reviewed the patients History and Physical, chart, labs and discussed the procedure including the risks, benefits and alternatives for the proposed anesthesia with the patient or authorized representative who has indicated his/her understanding and acceptance.       Plan Discussed with: CRNA, Anesthesiologist and Surgeon  Anesthesia Plan Comments:         Anesthesia Quick Evaluation

## 2020-07-07 NOTE — Consult Note (Signed)
WOC Nurse Consult Note: Reason for Consult:Nonhealing wounds to left lower leg near malleolus.  Fibrin to wound bed. Wound type: inflammatory Pressure Injury POA: NA Measurement: Left lateral leg:  3 cm x 1 cm x 0.2 cm  Left medial leg:  2 cm  2.5 cm x 0.2 cm  Wound bed: yellow fibrin Drainage (amount, consistency, odor) moderate serosanguinous  No odor.  Periwound: edema and chronic skin changes.  Dressing procedure/placement/frequency: Cleanse both legs with NS and pat dry.  Apply Aquacel AG to wound bed.  [facility] # [id number]) Cover with dry gauze and wrap with kerlix and ace wrap from below toes to below knees.  Change twice weekly Tuesday and Friday.  BEdside RN To perform.  Will not follow at this time.  Please re-consult if needed.  Domenic Moras MSN, RN, FNP-BC CWON Wound, Ostomy, Continence Nurse Pager [phone]

## 2020-07-07 NOTE — Plan of Care (Signed)
  Problem: Education: Goal: Knowledge of General Education information will improve Description: Including pain rating scale, medication(s)/side effects and non-pharmacologic comfort measures Outcome: Completed/Met   Problem: Clinical Measurements: Goal: Respiratory complications will improve Outcome: Completed/Met Goal: Cardiovascular complication will be avoided Outcome: Completed/Met   

## 2020-07-07 NOTE — Progress Notes (Signed)
PHARMACY NOTE:  ANTIMICROBIAL RENAL DOSAGE ADJUSTMENT  Current antimicrobial regimen includes a mismatch between antimicrobial dosage and estimated renal function.  As per policy approved by the Pharmacy & Therapeutics and Medical Executive Committees, the antimicrobial dosage will be adjusted accordingly.  Current antimicrobial dosage:  Cefazolin 1g IV q8  Indication: cellulitis  Renal Function:  Estimated Creatinine Clearance: 13.6 mL/min (A) (by C-G formula based on SCr of 1.47 mg/dL (H)). []      On intermittent HD, scheduled: []      On CRRT    Antimicrobial dosage has been changed to:  Cefazolin 1g IV q12  Additional comments:   Onnie Boer, PharmD, BCIDP, AAHIVP, CPP Infectious Disease Pharmacist 07/07/2020 4:16 PM

## 2020-07-07 NOTE — Progress Notes (Addendum)
PROGRESS NOTE    Kristina [name]  JOI:[id number] DOB: [birth year] DOA: 07/06/2020 PCP: Holland Commons, FNP   Brief Narrative: 86 year old with past medical history significant for hypertension, hyperlipidemia chronic anemia, CKD stage III, solitary kidney, AAA, PAD presented with worsening wound and left leg and decreased energy. Patient has chronic leg swelling since August this year.  Left leg redness appears worse. Patient was found to have a white count of 12, hemoglobin 5.2, occult blood positive. Patient received 2 unit of packed red blood cell.  X-ray of the right ankle revealed soft tissue swelling with no signs of bone infection. GI was consulted, plan is for endoscopy today.   Assessment & Plan:   Principal Problem:   Sepsis due to cellulitis [facility]) Active Problems:   Atherosclerotic PVD with intermittent claudication - near occlusive SFA disease with moderate iliac disease   Essential hypertension   Chronic kidney disease (CKD), stage III (moderate) (HCC)   Protein-calorie malnutrition (HCC)   Symptomatic anemia   GI bleed   1-Sepsis secondary to left Leg Cellulitis: Continue with IV antibiotics. Ancef.  Wound care.  WBC trending down.  Will add Doxy   2-Anemia, Symptomatic.  Received 2 units PRBC.  She will need IV iron when infection of LE is controlled.  For endoscopy today.   HTN;  Continue with hydralazine, holder parameter for Hypotension.   CKD stage III; Cr range 1.1--1.3 continue to monitor.   Protein caloric malnutrition; continue with supplement.    PVD; holding aspirin due to anemia.       Pressure Injury 07/06/20 Ischial tuberosity Left Deep Tissue Pressure Injury - Purple or maroon localized area of discolored intact skin or blood-filled blister due to damage of underlying soft tissue from pressure and/or shear. (Active)  07/06/20 2350  Location: Ischial tuberosity  Location Orientation: Left  Staging: Deep Tissue Pressure Injury  - Purple or maroon localized area of discolored intact skin or blood-filled blister due to damage of underlying soft tissue from pressure and/or shear.  Wound Description (Comments):   Present on Admission: Yes        Estimated body mass index is 16.3 kg/m as calculated from the following:   Height as of 05/05/20: 4\' 10"  (1.473 m).   Weight as of 05/05/20: 35.4 kg.   DVT prophylaxis: SCD Code Status: DNR Family Communication: will update Daughter later Disposition Plan:  Status is: Inpatient  Remains inpatient appropriate because:Ongoing diagnostic testing needed not appropriate for outpatient work up   Dispo: The patient is from: Home              Anticipated d/c is to: to be determine              Anticipated d/c date is: 2 days              Patient currently is not medically stable to d/c.        Consultants:   GI  Procedures:   Endoscopy   Antimicrobials:    Subjective: She is alert, awaiting for endoscopy. She was complaining of leg pain.    Objective: Vitals:   07/06/20 2333 07/07/20 0045 07/07/20 0050 07/07/20 0513  BP: (!) 149/56   (!) 112/55  Pulse: 72   67  Resp: 20   18  Temp: 98.4 F (36.9 C)   97.8 F (36.6 C)  TempSrc: Oral   Oral  SpO2: 98% (!) 89% 92% 99%    Intake/Output Summary (Last 24 hours) at 07/07/2020  [place] filed at 07/07/2020 0600 Gross per 24 hour  Intake 1042.07 ml  Output 0 ml  Net 1042.07 ml   There were no vitals filed for this visit.  Examination:  General exam: Appears calm and comfortable  Respiratory system: Clear to auscultation. Respiratory effort normal. Cardiovascular system: S1 & S2 heard, RRR. No JVD, murmurs, rubs, gallops or clicks. No pedal edema. Gastrointestinal system: Abdomen is nondistended, soft and nontender. No organomegaly or masses felt. Normal bowel sounds heard. Central nervous system: Alert and oriented. No focal neurological deficits. Extremities: BL with edema, redness ulcers.    Data Reviewed: I have personally reviewed following labs and imaging studies  CBC: Recent Labs  Lab 07/06/20 1026 07/07/20 0024  WBC 12.2* 9.1  NEUTROABS 10.6*  --   HGB 5.2* 7.7*  HCT 18.0* 24.6*  MCV 99.4 92.5  PLT 433* 638   Basic Metabolic Panel: Recent Labs  Lab 07/06/20 1026 07/07/20 0024  NA 138 138  K 4.5 4.4  CL 102 104  CO2 24 25  GLUCOSE 93 163*  BUN 47* 48*  CREATININE 1.35* 1.47*  CALCIUM 9.2 8.6*   GFR: CrCl cannot be calculated (Unknown ideal weight.). Liver Function Tests: Recent Labs  Lab 07/06/20 1026  AST 32  ALT 27  ALKPHOS 80  BILITOT 0.6  PROT 6.0*  ALBUMIN 3.1*   No results for input(s): LIPASE, AMYLASE in the last 168 hours. No results for input(s): AMMONIA in the last 168 hours. Coagulation Profile: No results for input(s): INR, PROTIME in the last 168 hours. Cardiac Enzymes: No results for input(s): CKTOTAL, CKMB, CKMBINDEX, TROPONINI in the last 168 hours. BNP (last 3 results) No results for input(s): PROBNP in the last 8760 hours. HbA1C: No results for input(s): HGBA1C in the last 72 hours. CBG: No results for input(s): GLUCAP in the last 168 hours. Lipid Profile: No results for input(s): CHOL, HDL, LDLCALC, TRIG, CHOLHDL, LDLDIRECT in the last 72 hours. Thyroid Function Tests: No results for input(s): TSH, T4TOTAL, FREET4, T3FREE, THYROIDAB in the last 72 hours. Anemia Panel: Recent Labs    07/06/20 1345 07/06/20 1523  VITAMINB12 193  --   FOLATE 15.4  --   FERRITIN 19  --   TIBC 490*  --   IRON 23*  --   RETICCTPCT  --  4.7*   Sepsis Labs: Recent Labs  Lab 07/06/20 1026  LATICACIDVEN 1.1    Recent Results (from the past 240 hour(s))  Resp Panel by RT-PCR (Flu A&B, Covid) Nasopharyngeal Swab     Status: None   Collection Time: 07/06/20  4:43 PM   Specimen: Nasopharyngeal Swab; Nasopharyngeal(NP) swabs in vial transport medium  Result Value Ref Range Status   SARS Coronavirus 2 by RT PCR NEGATIVE NEGATIVE  Final    Comment: (NOTE) SARS-CoV-2 target nucleic acids are NOT DETECTED.  The SARS-CoV-2 RNA is generally detectable in upper respiratory specimens during the acute phase of infection. The lowest concentration of SARS-CoV-2 viral copies this assay can detect is 138 copies/mL. A negative result does not preclude SARS-Cov-2 infection and should not be used as the sole basis for treatment or other patient management decisions. A negative result may occur with  improper specimen collection/handling, submission of specimen other than nasopharyngeal swab, presence of viral mutation(s) within the areas targeted by this assay, and inadequate number of viral copies(<138 copies/mL). A negative result must be combined with clinical observations, patient history, and epidemiological information. The expected result is Negative.  Fact  Sheet for Patients:  EntrepreneurPulse.com.au  Fact Sheet for Healthcare Providers:  IncredibleEmployment.be  This test is no t yet approved or cleared by the [place] FDA and  has been authorized for detection and/or diagnosis of SARS-CoV-2 by FDA under an Emergency Use Authorization (EUA). This EUA will remain  in effect (meaning this test can be used) for the duration of the COVID-19 declaration under Section 564(b)(1) of the Act, 21 U.S.C.section 360bbb-3(b)(1), unless the authorization is terminated  or revoked sooner.       Influenza A by PCR NEGATIVE NEGATIVE Final   Influenza B by PCR NEGATIVE NEGATIVE Final    Comment: (NOTE) The Xpert Xpress SARS-CoV-2/FLU/RSV plus assay is intended as an aid in the diagnosis of influenza from Nasopharyngeal swab specimens and should not be used as a sole basis for treatment. Nasal washings and aspirates are unacceptable for Xpert Xpress SARS-CoV-2/FLU/RSV testing.  Fact Sheet for Patients: EntrepreneurPulse.com.au  Fact Sheet for Healthcare  Providers: IncredibleEmployment.be  This test is not yet approved or cleared by the [place] FDA and has been authorized for detection and/or diagnosis of SARS-CoV-2 by FDA under an Emergency Use Authorization (EUA). This EUA will remain in effect (meaning this test can be used) for the duration of the COVID-19 declaration under Section 564(b)(1) of the Act, 21 U.S.C. section 360bbb-3(b)(1), unless the authorization is terminated or revoked.  Performed at [facility], [place] [address] [place], [place] [address]          Radiology Studies: DG Ankle Complete Left  Result Date: 07/06/2020 CLINICAL DATA:  Soft tissue infection EXAM: LEFT ANKLE COMPLETE - 3+ VIEW COMPARISON:  None. FINDINGS: Soft tissue swelling at the ankle. No erosive changes. No periosteal reaction. Degenerative changes at the posterior talocalcaneal articulation. Joint spaces are otherwise preserved. IMPRESSION: Soft tissue swelling at the ankle. No radiographic evidence of osteomyelitis. Electronically Signed   By: Macy Mis M.D.   On: 07/06/2020 13:57        Scheduled Meds: . acidophilus  1 capsule Oral QPC lunch  . hydrALAZINE  50 mg Oral BID  . pantoprazole (PROTONIX) IV  40 mg Intravenous Q12H  . sodium chloride flush  3 mL Intravenous Q12H   Continuous Infusions: . sodium chloride Stopped (07/06/20 1435)  .  ceFAZolin (ANCEF) IV 1 g (07/07/20 0515)     LOS: 1 day    Time spent: 35 minutes.     Elmarie Shiley, MD [facility]   If 7PM-7AM, please contact night-coverage [email or web address]  07/07/2020, 7:43 AM

## 2020-07-07 NOTE — Transfer of Care (Signed)
Immediate Anesthesia Transfer of Care Note  Patient: Kristina [name]  Procedure(s) Performed: ESOPHAGOGASTRODUODENOSCOPY (EGD) (Left ) BIOPSY  Patient Location: Endoscopy Unit  Anesthesia Type:MAC  Level of Consciousness: responds to stimulation  Airway & Oxygen Therapy: Patient Spontanous Breathing and Patient connected to nasal cannula oxygen  Post-op Assessment: Report given to RN and Post -op Vital signs reviewed and stable  Post vital signs: Reviewed and stable  Last Vitals:  Vitals Value Taken Time  BP    Temp    Pulse    Resp    SpO2      Last Pain:  Vitals:   07/07/20 1322  TempSrc: Oral  PainSc: 7       Patients Stated Pain Goal: 0 (76/81/15 7262)  Complications: No complications documented.

## 2020-07-07 NOTE — Interval H&P Note (Signed)
History and Physical Interval Note:  07/07/2020 2:21 PM  Kristina [name]  has presented today for surgery, with the diagnosis of IDA and guaiac positive stools.  The various methods of treatment have been discussed with the patient and family. After consideration of risks, benefits and other options for treatment, the patient has consented to  Procedure(s): ESOPHAGOGASTRODUODENOSCOPY (EGD) (Left) as a surgical intervention.  The patient's history has been reviewed, patient examined, no change in status, stable for surgery.  I have reviewed the patient's chart and labs.  Questions were answered to the patient's satisfaction.     Marcanthony Sleight D

## 2020-07-07 NOTE — Anesthesia Postprocedure Evaluation (Signed)
Anesthesia Post Note  Patient: Kristina [name]  Procedure(s) Performed: ESOPHAGOGASTRODUODENOSCOPY (EGD) (Left ) BIOPSY     Patient location during evaluation: Endoscopy Anesthesia Type: MAC Level of consciousness: awake and alert Pain management: pain level controlled Vital Signs Assessment: post-procedure vital signs reviewed and stable Respiratory status: spontaneous breathing, nonlabored ventilation, respiratory function stable and patient connected to nasal cannula oxygen Cardiovascular status: blood pressure returned to baseline and stable Postop Assessment: no apparent nausea or vomiting Anesthetic complications: no   No complications documented.  Last Vitals:  Vitals:   07/07/20 1500 07/07/20 1510  BP: (!) 116/54 (!) 117/48  Pulse: (!) 58 61  Resp: 12 (!) 6  Temp:    SpO2: 100% 99%    Last Pain:  Vitals:   07/07/20 1510  TempSrc:   PainSc: 0-No pain                 Nesanel Aguila S

## 2020-07-08 ENCOUNTER — Encounter (HOSPITAL_COMMUNITY): Payer: Self-pay | Admitting: Gastroenterology

## 2020-07-08 ENCOUNTER — Other Ambulatory Visit: Payer: Self-pay | Admitting: Physician Assistant

## 2020-07-08 LAB — CBC
HCT: 26.2 % — ABNORMAL LOW (ref 36.0–46.0)
Hemoglobin: 7.7 g/dL — ABNORMAL LOW (ref 12.0–15.0)
MCH: 28.1 pg (ref 26.0–34.0)
MCHC: 29.4 g/dL — ABNORMAL LOW (ref 30.0–36.0)
MCV: 95.6 fL (ref 80.0–100.0)
Platelets: 332 10*3/uL (ref 150–400)
RBC: 2.74 MIL/uL — ABNORMAL LOW (ref 3.87–5.11)
RDW: 16.7 % — ABNORMAL HIGH (ref 11.5–15.5)
WBC: 8.8 10*3/uL (ref 4.0–10.5)
nRBC: 0 % (ref 0.0–0.2)

## 2020-07-08 LAB — BASIC METABOLIC PANEL
Anion gap: 8 (ref 5–15)
BUN: 49 mg/dL — ABNORMAL HIGH (ref 8–23)
CO2: 27 mmol/L (ref 22–32)
Calcium: 8.7 mg/dL — ABNORMAL LOW (ref 8.9–10.3)
Chloride: 104 mmol/L (ref 98–111)
Creatinine, Ser: 1.43 mg/dL — ABNORMAL HIGH (ref 0.44–1.00)
GFR, Estimated: 34 mL/min — ABNORMAL LOW (ref >60)
Glucose, Bld: 87 mg/dL (ref 70–99)
Potassium: 5 mmol/L (ref 3.5–5.1)
Sodium: 139 mmol/L (ref 135–145)

## 2020-07-08 LAB — SURGICAL PATHOLOGY

## 2020-07-08 MED ORDER — SODIUM CHLORIDE 0.9 % IV SOLN: INTRAVENOUS | Status: AC

## 2020-07-08 NOTE — Progress Notes (Addendum)
PROGRESS NOTE    Kristina [name]  PPI:[id number] DOB: [birth year] DOA: 07/06/2020 PCP: Holland Commons, FNP   Brief Narrative: 86 year old with past medical history significant for hypertension, hyperlipidemia chronic anemia, CKD stage III, solitary kidney, AAA, PAD presented with worsening wound and left leg and decreased energy. Patient has chronic leg swelling since August this year.  Left leg redness appears worse on admission. Patient was found to have a white count of 12, hemoglobin 5.2, occult blood positive. Patient received 2 unit of packed red blood cell.  X-ray of the right ankle revealed soft tissue swelling with no signs of bone infection.  GI was consulted, underwent endoscopy 12/07 which showed benign appearing esophageal stenosis, 2 cm hiatal hernia, normal stomach.  No bleeding duodenal ulcers with no stigmata of bleeding.  GI recommended oral Protonix, follow-up biopsy results, follow-up as an outpatient with GI.  Patient also presented with sepsis secondary to left lower extremity cellulitis.  She was a started on IV Ancef and oral doxycycline.  Wound care consulted and following.  Redness left lower extremity has improved, but patient  still benefit from IV antibiotics.  PT is recommending a skilled nursing facility.  Social worker consulted.    Assessment & Plan:   Principal Problem:   Sepsis due to cellulitis [facility]) Active Problems:   Atherosclerotic PVD with intermittent claudication - near occlusive SFA disease with moderate iliac disease   Essential hypertension   Chronic kidney disease (CKD), stage III (moderate) (HCC)   Protein-calorie malnutrition (HCC)   Symptomatic anemia   GI bleed   1-Sepsis secondary to left Leg Cellulitis: Continue with IV antibiotics  Ancef and oral Doxycycline.  Wound care following, appreciate recommendations.  WBC trending down.  Redness improved. Would continue with IV antibiotics for now.   2-Anemia, Symptomatic.  Received  2 units PRBC.  She will need  iron when infection of LE is controlled.  Underwent endoscopy, did not show source of bleeding.  She was found to have No bleeding duodenal ulcers with no stigmata of bleeding. Hemoglobin remained stable at 7.7.  Duodenal ulcer: Continue with Protonix. Follow biopsy report.  HTN;  Continue with hydralazine, holder parameter for Hypotension.   CKD stage III; Cr range 1.1--1.3 continue to monitor.   Protein caloric malnutrition; continue with supplement.    PVD; holding aspirin due to anemia.   Acute metabolic encephalopathy: Patient appears more lethargic since after endoscopy. She has also received oxycodone for pain. Careful with oversedation, treat underlying infection.  Agree with nurse documentation in regards to  wound.  Pressure Injury 07/06/20 Ischial tuberosity Right Deep Tissue Pressure Injury - Purple or maroon localized area of discolored intact skin or blood-filled blister due to damage of underlying soft tissue from pressure and/or shear. (Active)  07/06/20 2350  Location: Ischial tuberosity  Location Orientation: Right  Staging: Deep Tissue Pressure Injury - Purple or maroon localized area of discolored intact skin or blood-filled blister due to damage of underlying soft tissue from pressure and/or shear.  Wound Description (Comments):   Present on Admission: Yes        Estimated body mass index is 17.83 kg/m as calculated from the following:   Height as of 05/05/20: 4\' 10"  (1.473 m).   Weight as of this encounter: 38.7 kg.   DVT prophylaxis: SCD Code Status: DNR Family Communication: Try to call daughter multiple times on 12/7, did not answer her phone.  I will try to contact daughter again today  Disposition Plan:  Status is: Inpatient  Remains inpatient appropriate because:Ongoing diagnostic testing needed not appropriate for outpatient work up   Dispo: The patient is from: Home              Anticipated d/c is to: to  be determine              Anticipated d/c date is: 2 days              Patient currently is not medically stable to d/c.        Consultants:   GI  Procedures:   Endoscopy   Antimicrobials:    Subjective: Appears more confused today, sleepy. She report pain in her legs.   Objective: Vitals:   07/07/20 1632 07/07/20 2236 07/08/20 0549 07/08/20 1006  BP: (!) 130/51 123/63 (!) 120/59 113/62  Pulse: 67 68 69 70  Resp: 17 12 14 16   Temp: 98.4 F (36.9 C) 98 F (36.7 C) 99.1 F (37.3 C) 98.3 F (36.8 C)  TempSrc:  Oral Oral Oral  SpO2: 96% 96% 97% 94%  Weight:  38.7 kg      Intake/Output Summary (Last 24 hours) at 07/08/2020 1256 Last data filed at 07/08/2020 0600 Gross per 24 hour  Intake 1000 ml  Output 50 ml  Net 950 ml   Filed Weights   07/07/20 1600 07/07/20 2236  Weight: 35.4 kg 38.7 kg    Examination:  General exam: NAD Respiratory system: CTA Cardiovascular system: S 1, S 2 RRR Gastrointestinal system: BS present, soft, nt Central nervous system: sleepy, confuse. Extremities: BL with dressing. Left LE with less edema and redness.   Data Reviewed: I have personally reviewed following labs and imaging studies  CBC: Recent Labs  Lab 07/06/20 1026 07/07/20 0024 07/08/20 0244  WBC 12.2* 9.1 8.8  NEUTROABS 10.6*  --   --   HGB 5.2* 7.7* 7.7*  HCT 18.0* 24.6* 26.2*  MCV 99.4 92.5 95.6  PLT 433* 324 902   Basic Metabolic Panel: Recent Labs  Lab 07/06/20 1026 07/07/20 0024 07/08/20 0244  NA 138 138 139  K 4.5 4.4 5.0  CL 102 104 104  CO2 24 25 27   GLUCOSE 93 163* 87  BUN 47* 48* 49*  CREATININE 1.35* 1.47* 1.43*  CALCIUM 9.2 8.6* 8.7*   GFR: Estimated Creatinine Clearance: 15.3 mL/min (A) (by C-G formula based on SCr of 1.43 mg/dL (H)). Liver Function Tests: Recent Labs  Lab 07/06/20 1026  AST 32  ALT 27  ALKPHOS 80  BILITOT 0.6  PROT 6.0*  ALBUMIN 3.1*   No results for input(s): LIPASE, AMYLASE in the last 168 hours. No  results for input(s): AMMONIA in the last 168 hours. Coagulation Profile: No results for input(s): INR, PROTIME in the last 168 hours. Cardiac Enzymes: No results for input(s): CKTOTAL, CKMB, CKMBINDEX, TROPONINI in the last 168 hours. BNP (last 3 results) No results for input(s): PROBNP in the last 8760 hours. HbA1C: No results for input(s): HGBA1C in the last 72 hours. CBG: Recent Labs  Lab 07/07/20 1729  GLUCAP 80   Lipid Profile: No results for input(s): CHOL, HDL, LDLCALC, TRIG, CHOLHDL, LDLDIRECT in the last 72 hours. Thyroid Function Tests: No results for input(s): TSH, T4TOTAL, FREET4, T3FREE, THYROIDAB in the last 72 hours. Anemia Panel: Recent Labs    07/06/20 1345 07/06/20 1523  VITAMINB12 193  --   FOLATE 15.4  --   FERRITIN 19  --   TIBC 490*  --   IRON  23*  --   RETICCTPCT  --  4.7*   Sepsis Labs: Recent Labs  Lab 07/06/20 1026  LATICACIDVEN 1.1    Recent Results (from the past 240 hour(s))  Blood culture (routine x 2)     Status: None (Preliminary result)   Collection Time: 07/06/20  1:01 PM   Specimen: BLOOD LEFT ARM  Result Value Ref Range Status   Specimen Description BLOOD LEFT ARM  Final   Special Requests   Final    BOTTLES DRAWN AEROBIC AND ANAEROBIC Blood Culture adequate volume   Culture   Final    NO GROWTH 2 DAYS Performed at [facility], [place] [address] [place], [place] [address]    Report Status PENDING  Incomplete  Blood culture (routine x 2)     Status: None (Preliminary result)   Collection Time: 07/06/20  1:01 PM   Specimen: BLOOD RIGHT ARM  Result Value Ref Range Status   Specimen Description BLOOD RIGHT ARM  Final   Special Requests   Final    BOTTLES DRAWN AEROBIC AND ANAEROBIC Blood Culture results may not be optimal due to an inadequate volume of blood received in culture bottles   Culture   Final    NO GROWTH 2 DAYS Performed at [facility], [place] [address] [place], [place] [address]    Report Status  PENDING  Incomplete  Resp Panel by RT-PCR (Flu A&B, Covid) Nasopharyngeal Swab     Status: None   Collection Time: 07/06/20  4:43 PM   Specimen: Nasopharyngeal Swab; Nasopharyngeal(NP) swabs in vial transport medium  Result Value Ref Range Status   SARS Coronavirus 2 by RT PCR NEGATIVE NEGATIVE Final    Comment: (NOTE) SARS-CoV-2 target nucleic acids are NOT DETECTED.  The SARS-CoV-2 RNA is generally detectable in upper respiratory specimens during the acute phase of infection. The lowest concentration of SARS-CoV-2 viral copies this assay can detect is 138 copies/mL. A negative result does not preclude SARS-Cov-2 infection and should not be used as the sole basis for treatment or other patient management decisions. A negative result may occur with  improper specimen collection/handling, submission of specimen other than nasopharyngeal swab, presence of viral mutation(s) within the areas targeted by this assay, and inadequate number of viral copies(<138 copies/mL). A negative result must be combined with clinical observations, patient history, and epidemiological information. The expected result is Negative.  Fact Sheet for Patients:  EntrepreneurPulse.com.au  Fact Sheet for Healthcare Providers:  IncredibleEmployment.be  This test is no t yet approved or cleared by the [place] FDA and  has been authorized for detection and/or diagnosis of SARS-CoV-2 by FDA under an Emergency Use Authorization (EUA). This EUA will remain  in effect (meaning this test can be used) for the duration of the COVID-19 declaration under Section 564(b)(1) of the Act, 21 U.S.C.section 360bbb-3(b)(1), unless the authorization is terminated  or revoked sooner.       Influenza A by PCR NEGATIVE NEGATIVE Final   Influenza B by PCR NEGATIVE NEGATIVE Final    Comment: (NOTE) The Xpert Xpress SARS-CoV-2/FLU/RSV plus assay is intended as an aid in the diagnosis of  influenza from Nasopharyngeal swab specimens and should not be used as a sole basis for treatment. Nasal washings and aspirates are unacceptable for Xpert Xpress SARS-CoV-2/FLU/RSV testing.  Fact Sheet for Patients: EntrepreneurPulse.com.au  Fact Sheet for Healthcare Providers: IncredibleEmployment.be  This test is not yet approved or cleared by the [place] FDA and has been  authorized for detection and/or diagnosis of SARS-CoV-2 by FDA under an Emergency Use Authorization (EUA). This EUA will remain in effect (meaning this test can be used) for the duration of the COVID-19 declaration under Section 564(b)(1) of the Act, 21 U.S.C. section 360bbb-3(b)(1), unless the authorization is terminated or revoked.  Performed at [facility], [place] [address] [place], [place] [address]          Radiology Studies: DG Ankle Complete Left  Result Date: 07/06/2020 CLINICAL DATA:  Soft tissue infection EXAM: LEFT ANKLE COMPLETE - 3+ VIEW COMPARISON:  None. FINDINGS: Soft tissue swelling at the ankle. No erosive changes. No periosteal reaction. Degenerative changes at the posterior talocalcaneal articulation. Joint spaces are otherwise preserved. IMPRESSION: Soft tissue swelling at the ankle. No radiographic evidence of osteomyelitis. Electronically Signed   By: Macy Mis M.D.   On: 07/06/2020 13:57        Scheduled Meds: . acidophilus  1 capsule Oral QPC lunch  . doxycycline  100 mg Oral Q12H  . feeding supplement  237 mL Oral BID BM  . hydrALAZINE  25 mg Oral BID  . pantoprazole  40 mg Oral BID  . sodium chloride flush  3 mL Intravenous Q12H   Continuous Infusions: .  ceFAZolin (ANCEF) IV       LOS: 2 days    Time spent: 35 minutes.     Elmarie Shiley, MD [facility]   If 7PM-7AM, please contact night-coverage [email or web address]  07/08/2020, 12:56 PM

## 2020-07-08 NOTE — Evaluation (Signed)
Physical Therapy Evaluation Patient Details Name: Kristina [name] MRN: [id number] DOB: April 13, 1926 Today's Date: 07/08/2020   History of Present Illness  86 y.o. female with medical history significant of HTN, HLD, chronic anemia CKD stage III with solitary kidney, AAA, and PAD presents to ED on 12/6 for worsening wound of the left leg and decreased energy. Guaiac + stool, upper GI endoscopy 12/7 demonstrates benign esophageal stenosis, hiatial hernia, nonbleeding duodenal ulcers.  Clinical Impression   Pt presents with generalized weakness, LE pain, AMS, and decreased activity tolerance. Pt to benefit from acute PT to address deficits. Pt requiring total +2 assist for to and from EOB, pt with limited verbal interaction with PT/OT this session. PT recommending SNF level of care post-acutely. PT to progress mobility as tolerated, and will continue to follow acutely.      Follow Up Recommendations SNF;Supervision/Assistance - 24 hour    Equipment Recommendations  None recommended by PT    Recommendations for Other Services       Precautions / Restrictions Precautions Precautions: Fall Precaution Comments: bilat LE wraps for cellulitis Restrictions Weight Bearing Restrictions: No      Mobility  Bed Mobility Overal bed mobility: Needs Assistance Bed Mobility: Rolling;Supine to Sit;Sit to Supine Rolling: Total assist   Supine to sit: Total assist;+2 for physical assistance Sit to supine: Total assist;+2 for physical assistance   General bed mobility comments: total +2 for all mobility, requires assist with all aspects. Pt limited by body-wide tremors during mobility, suspect partially due to fear of falling    Transfers                 General transfer comment: NT  Ambulation/Gait             General Gait Details: NT  Stairs            Wheelchair Mobility    Modified Rankin (Stroke Patients Only)       Balance Overall balance assessment: Needs  assistance Sitting-balance support: Feet supported;Single extremity supported Sitting balance-Leahy Scale: Poor Sitting balance - Comments: reliant on posterior truncal assist to maintain upright sitting Postural control: Posterior lean     Standing balance comment: NT                             Pertinent Vitals/Pain Pain Assessment: Faces Faces Pain Scale: Hurts little more Pain Location: LEs, especially during mobility Pain Descriptors / Indicators: Discomfort;Grimacing;Guarding;Other (Comment) (shaking) Pain Intervention(s): Limited activity within patient's tolerance;Monitored during session;Repositioned    Home Living Family/patient expects to be discharged to:: Private residence Living Arrangements: Alone Available Help at Discharge: Family (per chart review, family "checks on her") Type of Home: House Home Access: Level entry     Home Layout: One level Home Equipment: Environmental consultant - 2 wheels      Prior Function Level of Independence: Needs assistance   Gait / Transfers Assistance Needed: unsure, pt states she does not walk  ADL's / Homemaking Assistance Needed: Per chart review, pt was doing ADLs for self with assist from daughter as needed  Comments: information entered above obtained from chart review and previous PT notes, pt unable to state home set up info and pt is questionable historian given AMS     Hand Dominance   Dominant Hand: Right    Extremity/Trunk Assessment   Upper Extremity Assessment Upper Extremity Assessment: Defer to OT evaluation    Lower Extremity Assessment Lower Extremity Assessment:  Generalized weakness;Difficult to assess due to impaired cognition    Cervical / Trunk Assessment Cervical / Trunk Assessment: Other exceptions Cervical / Trunk Exceptions: forward head, rounded shoulders; very frail-appearing  Communication   Communication: HOH  Cognition Arousal/Alertness: Lethargic Behavior During Therapy:  Restless Overall Cognitive Status: No family/caregiver present to determine baseline cognitive functioning Area of Impairment: Orientation;Attention;Following commands;Awareness;Problem solving                 Orientation Level: Disoriented to;Place;Time;Situation;Person Current Attention Level: Focused   Following Commands: Follows one step commands inconsistently   Awareness: Intellectual Problem Solving: Slow processing;Difficulty sequencing;Decreased initiation;Requires verbal cues;Requires tactile cues General Comments: Pt states "yes" her birthday is in September and cannot state where she is given options. Pt with brief periods of alertness, very short attention span before drifting off to sleep/closing eyes. Follows ~25% commands, limited by anxiety and bodywide shaking stating "help me!"      General Comments General comments (skin integrity, edema, etc.): decreased L gaze, eyes not crossing midline to L even with max cuing (could be due to attentional deficits as well)    Exercises     Assessment/Plan    PT Assessment Patient needs continued PT services  PT Problem List Decreased strength;Decreased mobility;Decreased activity tolerance;Decreased balance;Decreased knowledge of use of DME;Pain;Decreased cognition;Decreased coordination;Decreased skin integrity;Decreased range of motion       PT Treatment Interventions DME instruction;Therapeutic activities;Gait training;Therapeutic exercise;Patient/family education;Balance training;Functional mobility training;Neuromuscular re-education    PT Goals (Current goals can be found in the Care Plan section)  Acute Rehab PT Goals Patient Stated Goal: none stated PT Goal Formulation: Patient unable to participate in goal setting Time For Goal Achievement: 07/22/20 Potential to Achieve Goals: Fair    Frequency Min 2X/week   Barriers to discharge        Co-evaluation PT/OT/SLP Co-Evaluation/Treatment: Yes Reason for  Co-Treatment: For patient/therapist safety;To address functional/ADL transfers PT goals addressed during session: Mobility/safety with mobility;Balance         AM-PAC PT "6 Clicks" Mobility  Outcome Measure Help needed turning from your back to your side while in a flat bed without using bedrails?: Total Help needed moving from lying on your back to sitting on the side of a flat bed without using bedrails?: Total Help needed moving to and from a bed to a chair (including a wheelchair)?: Total Help needed standing up from a chair using your arms (e.g., wheelchair or bedside chair)?: Total Help needed to walk in hospital room?: Total Help needed climbing 3-5 steps with a railing? : Total 6 Click Score: 6    End of Session   Activity Tolerance: Patient limited by fatigue;Patient limited by pain Patient left: in bed;with call bell/phone within reach;with bed alarm set Nurse Communication: Mobility status PT Visit Diagnosis: Other abnormalities of gait and mobility (R26.89)    Time: 0912-0928 PT Time Calculation (min) (ACUTE ONLY): 16 min   Charges:   PT Evaluation $PT Eval Low Complexity: 1 Low         Kristina [name] E, PT Acute Rehabilitation Services Pager [phone]  Office [phone]    Meliza Kage D Elonda Husky 07/08/2020, 10:25 AM

## 2020-07-08 NOTE — Evaluation (Signed)
Occupational Therapy Evaluation Patient Details Name: Kristina [name] MRN: [id number] DOB: 01-Sep-1925 Today's Date: 07/08/2020    History of Present Illness 86 y.o. female with medical history significant of HTN, HLD, chronic anemia CKD stage III with solitary kidney, AAA, and PAD presents to ED on 12/6 for worsening wound of the left leg and decreased energy. Guaiac + stool, upper GI endoscopy 12/7 demonstrates benign esophageal stenosis, hiatial hernia, nonbleeding duodenal ulcers.   Clinical Impression   Patient admitted for above and limited by problem list below, including impaired cognition, pain, generalized weakness, impaired balance and decreased activity tolerance. Pt opens eyes briefly to name, but otherwise disoriented; follows simple 1 step commands with ~25% accuracy when able to attend briefly.  Limited verbalizations during session, but stating "help me" at times and shaking in UEs appearing to be highly fearful of falling. Pt unable to provide PLOF or home setup due to cognitive status, taken from prior admissions.  Based on performance today, recommend continued OT services at SNF level.  Will follow acutely.     Follow Up Recommendations  SNF;Supervision/Assistance - 24 hour    Equipment Recommendations  Other (comment) (TBD at next venue of care )    Recommendations for Other Services       Precautions / Restrictions Precautions Precautions: Fall Precaution Comments: bilat LE wraps for cellulitis Restrictions Weight Bearing Restrictions: No      Mobility Bed Mobility Overal bed mobility: Needs Assistance Bed Mobility: Rolling;Supine to Sit;Sit to Supine Rolling: Total assist   Supine to sit: Total assist;+2 for physical assistance Sit to supine: Total assist;+2 for physical assistance   General bed mobility comments: total +2 for all mobility, requires assist with all aspects. Pt limited by body-wide tremors during mobility, suspect partially due to fear of  falling    Transfers                 General transfer comment: NT d/t safety     Balance Overall balance assessment: Needs assistance Sitting-balance support: Feet supported;Single extremity supported Sitting balance-Leahy Scale: Poor Sitting balance - Comments: reliant on posterior truncal assist to maintain upright sitting Postural control: Posterior lean     Standing balance comment: NT                           ADL either performed or assessed with clinical judgement   ADL Overall ADL's : Needs assistance/impaired     Grooming: Total assistance;Bed level Grooming Details (indicate cue type and reason): attempted [facility], but limited due to UE tremors/shaking                              Functional mobility during ADLs: Total assistance;+2 for physical assistance;+2 for safety/equipment General ADL Comments: total assist for all self care at this time      Vision   Additional Comments: noted some decreased gaze in L visual field, R preference; difficult to assess and requires cueing to maintain eyes open      Perception     Praxis      Pertinent Vitals/Pain Pain Assessment: Faces Faces Pain Scale: Hurts little more Pain Location: LEs, especially during mobility, "all over"  Pain Descriptors / Indicators: Discomfort;Grimacing;Guarding;Other (Comment) ("shaking" ) Pain Intervention(s): Limited activity within patient's tolerance;Monitored during session;Repositioned     Hand Dominance Right   Extremity/Trunk Assessment Upper Extremity Assessment Upper Extremity Assessment: Generalized weakness (B  UE tremors/shaking noted)   Lower Extremity Assessment Lower Extremity Assessment: Defer to PT evaluation   Cervical / Trunk Assessment Cervical / Trunk Assessment: Other exceptions Cervical / Trunk Exceptions: forward head, rounded shoulders; very frail-appearing   Communication Communication Communication: HOH   Cognition  Arousal/Alertness: Lethargic Behavior During Therapy: Restless Overall Cognitive Status: No family/caregiver present to determine baseline cognitive functioning Area of Impairment: Orientation;Attention;Following commands;Awareness;Problem solving                 Orientation Level: Disoriented to;Place;Time;Situation;Person Current Attention Level: Focused   Following Commands: Follows one step commands inconsistently   Awareness: Intellectual Problem Solving: Slow processing;Difficulty sequencing;Decreased initiation;Requires verbal cues;Requires tactile cues General Comments: patient inconsistent with yes/no questions voicing "yes" to her birthday in september, unable to state location given options.  Focused attention at times to follow simple 1 step commands with ~25% accuracy, closes eyes and appears to "drift off" when sitting up with assist.  Limited by body wide shaking and voicing "help me".    General Comments  decreased L gaze, eyes not crossing midline to L even with max cuing (could be due to attentional deficits as well)    Exercises     Shoulder Instructions      Home Living Family/patient expects to be discharged to:: Private residence Living Arrangements: Alone Available Help at Discharge: Family (per chart review family "checks on her" ) Type of Home: House Home Access: Level entry     Home Layout: One level     Bathroom Shower/Tub: Teacher, early years/pre: Standard     Home Equipment: Environmental consultant - 2 wheels          Prior Functioning/Environment Level of Independence: Needs assistance  Gait / Transfers Assistance Needed: unsure, pt states she does not walk ADL's / Homemaking Assistance Needed: Per chart review, pt was doing ADLs for self with assist from daughter as needed   Comments: information entered above obtained from chart review and previous PT notes, pt unable to state home set up info and pt is questionable historian given AMS         OT Problem List: Decreased strength;Decreased activity tolerance;Impaired balance (sitting and/or standing);Decreased range of motion;Impaired vision/perception;Decreased coordination;Decreased cognition;Decreased safety awareness;Decreased knowledge of use of DME or AE;Decreased knowledge of precautions;Pain      OT Treatment/Interventions: Self-care/ADL training;Therapeutic exercise;DME and/or AE instruction;Therapeutic activities;Cognitive remediation/compensation;Patient/family education;Balance training    OT Goals(Current goals can be found in the care plan section) Acute Rehab OT Goals Patient Stated Goal: none stated OT Goal Formulation: Patient unable to participate in goal setting Time For Goal Achievement: 07/22/20 Potential to Achieve Goals: Fair  OT Frequency: Min 2X/week   Barriers to D/C:            Co-evaluation PT/OT/SLP Co-Evaluation/Treatment: Yes Reason for Co-Treatment: For patient/therapist safety;Necessary to address cognition/behavior during functional activity;To address functional/ADL transfers PT goals addressed during session: Mobility/safety with mobility;Balance OT goals addressed during session: ADL's and self-care      AM-PAC OT "6 Clicks" Daily Activity     Outcome Measure Help from another person eating meals?: Total Help from another person taking care of personal grooming?: Total Help from another person toileting, which includes using toliet, bedpan, or urinal?: Total Help from another person bathing (including washing, rinsing, drying)?: Total Help from another person to put on and taking off regular upper body clothing?: Total Help from another person to put on and taking off regular lower body clothing?: Total 6 Click  Score: 6   End of Session Equipment Utilized During Treatment: Oxygen Nurse Communication: Mobility status  Activity Tolerance: Patient limited by lethargy;Patient limited by pain Patient left: in bed;with call  bell/phone within reach;with bed alarm set  OT Visit Diagnosis: Other abnormalities of gait and mobility (R26.89);Muscle weakness (generalized) (M62.81);Pain;Other symptoms and signs involving cognitive function;History of falling (Z91.81) Pain - part of body:  (generalized )                Time: 0141-0301 OT Time Calculation (min): 17 min Charges:  OT General Charges $OT Visit: 1 Visit OT Evaluation $OT Eval Moderate Complexity: 1 Mod  Jolaine Artist, OT Acute Rehabilitation Services Pager [phone] Office [phone]   Delight Stare 07/08/2020, 10:44 AM

## 2020-07-08 NOTE — Progress Notes (Signed)
Subjective: Since I last evaluated the patient, she seems stable from a GI standpoint. She is confused today. She complains of severe pain her legs.   Objective: Vital signs in last 24 hours: Temp:  [97.8 F (36.6 C)-99.1 F (37.3 C)] 97.8 F (36.6 C) (12/08 1714) Pulse Rate:  [68-76] 76 (12/08 1714) Resp:  [12-17] 17 (12/08 1714) BP: (113-123)/(51-63) 121/51 (12/08 1714) SpO2:  [94 %-98 %] 98 % (12/08 1714) Weight:  [38.7 kg] 38.7 kg (12/07 2236) Last BM Date: 07/06/20  Intake/Output from previous day: 12/07 0701 - 12/08 0700 In: 1363 [P.O.:960; I.V.:403] Out: 50 [Urine:50] Intake/Output this shift: Total I/O In: 220 [P.O.:220] Out: 100 [Urine:100]  General appearance: fatigued, mild distress, pale and slowed mentation Resp: clear to auscultation bilaterally Cardio: regular rate and rhythm, S1, S2 normal, no murmur, click, rub or gallop GI: soft, non-tender; bowel sounds normal; no masses,  no organomegaly Extremities: lower extremities are bandaged  Lab Results: Recent Labs    07/06/20 1026 07/07/20 0024 07/08/20 0244  WBC 12.2* 9.1 8.8  HGB 5.2* 7.7* 7.7*  HCT 18.0* 24.6* 26.2*  PLT 433* 324 332   BMET Recent Labs    07/06/20 1026 07/07/20 0024 07/08/20 0244  NA 138 138 139  K 4.5 4.4 5.0  CL 102 104 104  CO2 24 25 27   GLUCOSE 93 163* 87  BUN 47* 48* 49*  CREATININE 1.35* 1.47* 1.43*  CALCIUM 9.2 8.6* 8.7*   LFT Recent Labs    07/06/20 1026  PROT 6.0*  ALBUMIN 3.1*  AST 32  ALT 27  ALKPHOS 80  BILITOT 0.6   Studies/Results: No results found.  Medications: I have reviewed the patient's current medications.  Assessment/Plan: 1) Severe anemia with some healing ulcers in the duodenum.  No definite source of bleeding identified.  Patient's family does not want her to have a colonoscopy as she is DNR 2) Extensive diverticulosis on previous colonoscopy with a history of chronic constipation on MiraLAX at home. 3) Lower extremity cellulitis on  antibiotics. 4) HTN/CKD. 5) PVD. 6) Protein calorie malnutrition.  LOS: 2 days   Juanita Craver 07/08/2020, 5:52 PM

## 2020-07-09 LAB — BASIC METABOLIC PANEL
Anion gap: 9 (ref 5–15)
BUN: 48 mg/dL — ABNORMAL HIGH (ref 8–23)
CO2: 23 mmol/L (ref 22–32)
Calcium: 8.7 mg/dL — ABNORMAL LOW (ref 8.9–10.3)
Chloride: 106 mmol/L (ref 98–111)
Creatinine, Ser: 1.42 mg/dL — ABNORMAL HIGH (ref 0.44–1.00)
GFR, Estimated: 35 mL/min — ABNORMAL LOW (ref >60)
Glucose, Bld: 109 mg/dL — ABNORMAL HIGH (ref 70–99)
Potassium: 4.8 mmol/L (ref 3.5–5.1)
Sodium: 138 mmol/L (ref 135–145)

## 2020-07-09 LAB — CBC
HCT: 27.2 % — ABNORMAL LOW (ref 36.0–46.0)
Hemoglobin: 8 g/dL — ABNORMAL LOW (ref 12.0–15.0)
MCH: 28.5 pg (ref 26.0–34.0)
MCHC: 29.4 g/dL — ABNORMAL LOW (ref 30.0–36.0)
MCV: 96.8 fL (ref 80.0–100.0)
Platelets: 313 10*3/uL (ref 150–400)
RBC: 2.81 MIL/uL — ABNORMAL LOW (ref 3.87–5.11)
RDW: 16.4 % — ABNORMAL HIGH (ref 11.5–15.5)
WBC: 10.8 10*3/uL — ABNORMAL HIGH (ref 4.0–10.5)
nRBC: 0.4 % — ABNORMAL HIGH (ref 0.0–0.2)

## 2020-07-09 LAB — GLUCOSE, CAPILLARY: Glucose-Capillary: 82 mg/dL (ref 70–99)

## 2020-07-09 MED ORDER — ENSURE ENLIVE PO LIQD
237.0000 mL | Freq: Three times a day (TID) | ORAL | Status: DC
  Administered 2020-07-09 – 2020-07-13 (×11): 237 mL via ORAL
  Filled 2020-07-09: qty 237

## 2020-07-09 MED ORDER — ADULT MULTIVITAMIN W/MINERALS CH
1.0000 | ORAL_TABLET | Freq: Every day | ORAL | Status: DC
  Administered 2020-07-09 – 2020-07-13 (×5): 1 via ORAL
  Filled 2020-07-09 (×5): qty 1

## 2020-07-09 NOTE — Progress Notes (Signed)
Initial Nutrition Assessment  DOCUMENTATION CODES:   Severe malnutrition in context of chronic illness  INTERVENTION:   Ensure Enlive po TID, each supplement provides 350 kcal and 20 grams of protein  Magic cup TID with meals, each supplement provides 290 kcal and 9 grams of protein  MVI with minerals daily  If pt's PO intake does not improve and if aligned with GOC, recommend initiation of enteral nutrition to help pt meet kcals/protein needs.   NUTRITION DIAGNOSIS:   Severe Malnutrition related to chronic illness as evidenced by severe muscle depletion,severe fat depletion.    GOAL:   Patient will meet greater than or equal to 90% of their needs    MONITOR:   PO intake,Supplement acceptance,Labs,I & O's,Weight trends,Skin  REASON FOR ASSESSMENT:   Consult Assessment of nutrition requirement/status  ASSESSMENT:   Pt admitted with sepsis 2/2 LLE cellulitis and worsening L leg wound. PMH includes HTN, HLD, chronic anemia, CKD stg III, solitary kidney, AAA, peripheral arterial disease.  12/7 EGD revealed benign appearing esophageal stenosis, hiatal hernia. Biopsy pending.   Pt provided no responses to RD questions. Pt groaning during exam. Discussed pt with RN in detail who reports that pt had been feeding herself on Tuesday, but has not returned to that since having EGD. RN noted that pt's mentation at time of RD visit had actually improved since earlier in the morning.  Pt with very poor po intake since admission. Meal completions charted as 0-75% with an average intake of ~23%. Pt currently with orders for Ensure Enlive BID. She had been refusing these/not offered these due to poor mentation, but did accept the supplement tonight. Hopefully, pt's po intake will improve and pt will accept supplements. If pt's po intake does not improve and if aligned with GOC, may need to consider initiation of nutrition support.   Reviewed weight history. No significant weight changes  noted.  UOP: 647ml x24 hours  Labs and medications reviewed.   NUTRITION - FOCUSED PHYSICAL EXAM:  Flowsheet Row Most Recent Value  Orbital Region Moderate depletion  Upper Arm Region Severe depletion  Thoracic and Lumbar Region Severe depletion  Buccal Region Severe depletion  Temple Region Severe depletion  Clavicle Bone Region Severe depletion  Clavicle and Acromion Bone Region Severe depletion  Scapular Bone Region Severe depletion  Dorsal Hand Severe depletion  Patellar Region Severe depletion  Anterior Thigh Region Severe depletion  Posterior Calf Region Severe depletion  Edema (RD Assessment) None  Hair Reviewed  Eyes Reviewed  Mouth Reviewed  Skin Other (Comment)  [skin very dry]  Nails Reviewed       Diet Order:   Diet Order            Diet regular Room service appropriate? Yes; Fluid consistency: Thin  Diet effective now                 EDUCATION NEEDS:   Not appropriate for education at this time  Skin:  Skin Assessment: Skin Integrity Issues: Skin Integrity Issues:: Other (Comment),DTI DTI: R Ischial tuberosity Other: lateral and medial leg wounds with cellulitis  Last BM:  12/6  Height:   Ht Readings from Last 1 Encounters:  05/05/20 4\' 10"  (1.473 m)    Weight:   Wt Readings from Last 1 Encounters:  07/08/20 38.7 kg    BMI:  Body mass index is 17.83 kg/m.  Estimated Nutritional Needs:   Kcal:  1200-1400  Protein:  60-75 grams  Fluid:  >1.2L/d  Larkin Ina, MS, RD, LDN RD pager number and weekend/on-call pager number located in [place].

## 2020-07-09 NOTE — Progress Notes (Addendum)
PROGRESS NOTE    Kristina [name]  QQI:[id number] DOB: 03-Sep-1925 DOA: 07/06/2020 PCP: Holland Commons, FNP   Brief Narrative:  86 years old female with past medical history of hypertension, hyperlipidemia, chronic anemia, CKD stage III, solitary kidney, AAA, peripheral arterial disease presented to the hospital with left leg wound which was worsening in nature with decreased energy.  Patient was found to have a white count of 12, hemoglobin 5.2, occult blood positive. Patient received 2 unit of packed red blood cells for anemia.Marland Kitchen  X-ray of the right ankle revealed soft tissue swelling with no signs of bone infection.GI was consulted, underwent endoscopy 12/07 which showed benign appearing esophageal stenosis, 2 cm hiatal hernia, normal stomach.  No bleeding duodenal ulcers with no stigmata of bleeding.  GI recommended oral Protonix, follow-up biopsy results, follow-up as an outpatient with GI. Patient also presented with sepsis secondary to left lower extremity cellulitis.  She was a started on IV Ancef and oral doxycycline.  Wound care consulted and following.  Redness left lower extremity has improved, but patient  still benefit from IV antibiotics.  PT is recommending  skilled nursing facility.  Social worker consulted for placement.  Assessment & Plan:   Principal Problem:   Sepsis due to cellulitis [facility]) Active Problems:   Atherosclerotic PVD with intermittent claudication - near occlusive SFA disease with moderate iliac disease   Essential hypertension   Chronic kidney disease (CKD), stage III (moderate) (HCC)   Protein-calorie malnutrition (HCC)   Symptomatic anemia   GI bleed  Sepsis secondary to left leg cellulitis: Continue Ancef and doxycycline.  Follow wound care.  Trending down WBC.  Improving.  Blood cultures negative in 2 days.  Continue wound care  Acute symptomatic anemia. Patient received 2 units of packed RBC.  Upper GI endoscopy showed evidence of nonbleeding duodenal  ulcer.  Hemoglobin stable at 8.0.  Continue Protonix.  Follow biopsy.  Lisinopril  Duodenal ulcer.  Status post EGD.  Continue Protonix.  Follow biopsy  Essential HTN;  Continue with hydralazine.  Monitor blood pressure closely.  CKD stage IIIb Cr range 1.1--1.3. continue to monitor.  Creatinine of 1.4 today.  Protein calorie malnutrition; present on admission continue with oral supplement.  Poor oral intake.  Dietitian has been consulted.  Peripheral vascular disease.  Hold aspirin due to symptomatic anemia  Acute metabolic encephalopathy: Continue treatment for infection.  Avoid oversedation.  Mildly communicative.  Only saying yes or no  Deep tissue injury of the right ischial tuberosity.,  Left lateral leg and medial leg wounds with cellulitis. Present on admission.  Continue wound care  Pressure Injury 07/06/20 Ischial tuberosity Right Deep Tissue Pressure Injury - Purple or maroon localized area of discolored intact skin or blood-filled blister due to damage of underlying soft tissue from pressure and/or shear. (Active)  07/06/20 2350  Location: Ischial tuberosity  Location Orientation: Right  Staging: Deep Tissue Pressure Injury - Purple or maroon localized area of discolored intact skin or blood-filled blister due to damage of underlying soft tissue from pressure and/or shear.  Wound Description (Comments):   Present on Admission: Yes    DVT prophylaxis: SCD  Code Status: DNR  Family Communication:  I tried to call the patient's son on the phone about his mailbox was full.  I spoke with the patient's daughter and updated her about the clinical condition of the patient.   Disposition Plan:  Status is: Inpatient  Remains inpatient appropriate because:Ongoing diagnostic testing needed not appropriate for outpatient  work up, IV antibiotics, need for skilled nursing facility placement  Dispo: The patient is from: Home              Anticipated d/c is to: To be determined,  Likely skilled nursing facility as per PT evaluation. Spoke with the patient's daughter about it.               Anticipated d/c date is: 2 days              Patient currently is not medically stable to d/c.  Consultants:   GI  Procedures:   Upper GI endoscopy with biopsy 07/07/20  Antimicrobials:  Ancef Doxycycline  Subjective: Today, patient was seen and examined at bedside. Only answering yes or no but appears more sleepy. Denies pain, nausea, vomiting.   Objective: Vitals:   07/08/20 1006 07/08/20 1714 07/08/20 2050 07/09/20 0450  BP: 113/62 (!) 121/51 126/65 (!) 125/54  Pulse: 70 76 82 86  Resp: 16 17 15 14   Temp: 98.3 F (36.8 C) 97.8 F (36.6 C) 98.4 F (36.9 C) 98.5 F (36.9 C)  TempSrc: Oral Axillary    SpO2: 94% 98% 96% 92%  Weight:   38.7 kg     Intake/Output Summary (Last 24 hours) at 07/09/2020 0734 Last data filed at 07/09/2020 0558 Gross per 24 hour  Intake 1285.47 ml  Output 625 ml  Net 660.47 ml   Filed Weights   07/07/20 1600 07/07/20 2236 07/08/20 2050  Weight: 35.4 kg 38.7 kg 38.7 kg   Body mass index is 17.83 kg/m.  Physical examination:  General: Thinly built, not in obvious distress, confused, answering few questions, HENT:   No scleral pallor or icterus noted. Oral mucosa is moist.  Chest:  Clear breath sounds.  Diminished breath sounds bilaterally. No crackles or wheezes.  CVS: S1 &S2 heard. No murmur.  Regular rate and rhythm. Abdomen: Soft, nontender, nondistended.  Bowel sounds are heard.   Extremities: No cyanosis, clubbing or edema.  Peripheral pulses are palpable. Left lower extremity with edema and redness.  Bilateral lower extremity with dressing Psych: answering yes or now CNS:  No cranial nerve deficits.  Moving extremities.  Confused  skin: Warm and dry.  No rashes noted.  Deep tissue injury of the ischial tuberosity.  Left lateral leg with 3 x 1 x 0.2 cm wound and left medial leg with 12 x 2.5 x 0.2 cm wound.   Data  Reviewed: I have personally reviewed following labs and imaging studies  CBC: Recent Labs  Lab 07/06/20 1026 07/07/20 0024 07/08/20 0244 07/09/20 0033  WBC 12.2* 9.1 8.8 10.8*  NEUTROABS 10.6*  --   --   --   HGB 5.2* 7.7* 7.7* 8.0*  HCT 18.0* 24.6* 26.2* 27.2*  MCV 99.4 92.5 95.6 96.8  PLT 433* 324 332 854   Basic Metabolic Panel: Recent Labs  Lab 07/06/20 1026 07/07/20 0024 07/08/20 0244 07/09/20 0033  NA 138 138 139 138  K 4.5 4.4 5.0 4.8  CL 102 104 104 106  CO2 24 25 27 23   GLUCOSE 93 163* 87 109*  BUN 47* 48* 49* 48*  CREATININE 1.35* 1.47* 1.43* 1.42*  CALCIUM 9.2 8.6* 8.7* 8.7*   GFR: Estimated Creatinine Clearance: 15.4 mL/min (A) (by C-G formula based on SCr of 1.42 mg/dL (H)). Liver Function Tests: Recent Labs  Lab 07/06/20 1026  AST 32  ALT 27  ALKPHOS 80  BILITOT 0.6  PROT 6.0*  ALBUMIN 3.1*   No  results for input(s): LIPASE, AMYLASE in the last 168 hours. No results for input(s): AMMONIA in the last 168 hours. Coagulation Profile: No results for input(s): INR, PROTIME in the last 168 hours. Cardiac Enzymes: No results for input(s): CKTOTAL, CKMB, CKMBINDEX, TROPONINI in the last 168 hours. BNP (last 3 results) No results for input(s): PROBNP in the last 8760 hours. HbA1C: No results for input(s): HGBA1C in the last 72 hours. CBG: Recent Labs  Lab 07/07/20 1729  GLUCAP 80   Lipid Profile: No results for input(s): CHOL, HDL, LDLCALC, TRIG, CHOLHDL, LDLDIRECT in the last 72 hours. Thyroid Function Tests: No results for input(s): TSH, T4TOTAL, FREET4, T3FREE, THYROIDAB in the last 72 hours. Anemia Panel: Recent Labs    07/06/20 1345 07/06/20 1523  VITAMINB12 193  --   FOLATE 15.4  --   FERRITIN 19  --   TIBC 490*  --   IRON 23*  --   RETICCTPCT  --  4.7*   Sepsis Labs: Recent Labs  Lab 07/06/20 1026  LATICACIDVEN 1.1    Recent Results (from the past 240 hour(s))  Blood culture (routine x 2)     Status: None (Preliminary  result)   Collection Time: 07/06/20  1:01 PM   Specimen: BLOOD LEFT ARM  Result Value Ref Range Status   Specimen Description BLOOD LEFT ARM  Final   Special Requests   Final    BOTTLES DRAWN AEROBIC AND ANAEROBIC Blood Culture adequate volume   Culture   Final    NO GROWTH 2 DAYS Performed at [facility], [place] [address] [place], [place] [address]    Report Status PENDING  Incomplete  Blood culture (routine x 2)     Status: None (Preliminary result)   Collection Time: 07/06/20  1:01 PM   Specimen: BLOOD RIGHT ARM  Result Value Ref Range Status   Specimen Description BLOOD RIGHT ARM  Final   Special Requests   Final    BOTTLES DRAWN AEROBIC AND ANAEROBIC Blood Culture results may not be optimal due to an inadequate volume of blood received in culture bottles   Culture   Final    NO GROWTH 2 DAYS Performed at [facility], [place] [address], [place], [place] [address]    Report Status PENDING  Incomplete  Resp Panel by RT-PCR (Flu A&B, Covid) Nasopharyngeal Swab     Status: None   Collection Time: 07/06/20  4:43 PM   Specimen: Nasopharyngeal Swab; Nasopharyngeal(NP) swabs in vial transport medium  Result Value Ref Range Status   SARS Coronavirus 2 by RT PCR NEGATIVE NEGATIVE Final    Comment: (NOTE) SARS-CoV-2 target nucleic acids are NOT DETECTED.  The SARS-CoV-2 RNA is generally detectable in upper respiratory specimens during the acute phase of infection. The lowest concentration of SARS-CoV-2 viral copies this assay can detect is 138 copies/mL. A negative result does not preclude SARS-Cov-2 infection and should not be used as the sole basis for treatment or other patient management decisions. A negative result may occur with  improper specimen collection/handling, submission of specimen other than nasopharyngeal swab, presence of viral mutation(s) within the areas targeted by this assay, and inadequate number of viral copies(<138 copies/mL). A negative result  must be combined with clinical observations, patient history, and epidemiological information. The expected result is Negative.  Fact Sheet for Patients:  EntrepreneurPulse.com.au  Fact Sheet for Healthcare Providers:  IncredibleEmployment.be  This test is no t yet approved or cleared by the [place] FDA  and  has been authorized for detection and/or diagnosis of SARS-CoV-2 by FDA under an Emergency Use Authorization (EUA). This EUA will remain  in effect (meaning this test can be used) for the duration of the COVID-19 declaration under Section 564(b)(1) of the Act, 21 U.S.C.section 360bbb-3(b)(1), unless the authorization is terminated  or revoked sooner.       Influenza A by PCR NEGATIVE NEGATIVE Final   Influenza B by PCR NEGATIVE NEGATIVE Final    Comment: (NOTE) The Xpert Xpress SARS-CoV-2/FLU/RSV plus assay is intended as an aid in the diagnosis of influenza from Nasopharyngeal swab specimens and should not be used as a sole basis for treatment. Nasal washings and aspirates are unacceptable for Xpert Xpress SARS-CoV-2/FLU/RSV testing.  Fact Sheet for Patients: EntrepreneurPulse.com.au  Fact Sheet for Healthcare Providers: IncredibleEmployment.be  This test is not yet approved or cleared by the [place] FDA and has been authorized for detection and/or diagnosis of SARS-CoV-2 by FDA under an Emergency Use Authorization (EUA). This EUA will remain in effect (meaning this test can be used) for the duration of the COVID-19 declaration under Section 564(b)(1) of the Act, 21 U.S.C. section 360bbb-3(b)(1), unless the authorization is terminated or revoked.  Performed at [facility], [place] [address], [place], [place] [address]       Radiology Studies: No results found.    Scheduled Meds: . acidophilus  1 capsule Oral QPC lunch  . doxycycline  100 mg Oral Q12H  . feeding  supplement  237 mL Oral BID BM  . hydrALAZINE  25 mg Oral BID  . pantoprazole  40 mg Oral BID  . sodium chloride flush  3 mL Intravenous Q12H   Continuous Infusions: . sodium chloride 50 mL/hr at 07/08/20 1800  .  ceFAZolin (ANCEF) IV 1 g (07/09/20 0531)     LOS: 3 days    Flora Lipps, MD [facility] 07/09/2020

## 2020-07-09 NOTE — Plan of Care (Signed)
  Problem: Coping: Goal: Level of anxiety will decrease Outcome: Progressing   Problem: Pain Managment: Goal: General experience of comfort will improve Outcome: Progressing   

## 2020-07-09 NOTE — Progress Notes (Addendum)
Patient difficult to wake up, lethargic, slow to react, responded to  commands(smilled, stuck tongue out)  to stimulus, eyes reactive to light (closed eyes when bright light shined into) CBG checked 82 vitals as noted, strong bilateral grips, Doctor notified by secure chat.  BP 121/63 Temp 98 Pulse 78 Resp 19  O2 92

## 2020-07-09 NOTE — Plan of Care (Signed)
  Problem: Activity: Goal: Risk for activity intolerance will decrease Outcome: Not Progressing   Problem: Nutrition: Goal: Adequate nutrition will be maintained Outcome: Not Progressing   

## 2020-07-10 ENCOUNTER — Inpatient Hospital Stay (HOSPITAL_COMMUNITY): Payer: Medicare Other

## 2020-07-10 DIAGNOSIS — R0600 Dyspnea, unspecified: Secondary | ICD-10-CM

## 2020-07-10 DIAGNOSIS — E43 Unspecified severe protein-calorie malnutrition: Secondary | ICD-10-CM

## 2020-07-10 LAB — COMPREHENSIVE METABOLIC PANEL
ALT: 7 U/L (ref 0–44)
AST: 21 U/L (ref 15–41)
Albumin: 2.5 g/dL — ABNORMAL LOW (ref 3.5–5.0)
Alkaline Phosphatase: 86 U/L (ref 38–126)
Anion gap: 11 (ref 5–15)
BUN: 48 mg/dL — ABNORMAL HIGH (ref 8–23)
CO2: 25 mmol/L (ref 22–32)
Calcium: 8.9 mg/dL (ref 8.9–10.3)
Chloride: 103 mmol/L (ref 98–111)
Creatinine, Ser: 1.39 mg/dL — ABNORMAL HIGH (ref 0.44–1.00)
GFR, Estimated: 36 mL/min — ABNORMAL LOW (ref >60)
Glucose, Bld: 141 mg/dL — ABNORMAL HIGH (ref 70–99)
Potassium: 4.5 mmol/L (ref 3.5–5.1)
Sodium: 139 mmol/L (ref 135–145)
Total Bilirubin: 0.7 mg/dL (ref 0.3–1.2)
Total Protein: 5.7 g/dL — ABNORMAL LOW (ref 6.5–8.1)

## 2020-07-10 LAB — CBC
HCT: 28 % — ABNORMAL LOW (ref 36.0–46.0)
Hemoglobin: 8.4 g/dL — ABNORMAL LOW (ref 12.0–15.0)
MCH: 28.7 pg (ref 26.0–34.0)
MCHC: 30 g/dL (ref 30.0–36.0)
MCV: 95.6 fL (ref 80.0–100.0)
Platelets: 340 10*3/uL (ref 150–400)
RBC: 2.93 MIL/uL — ABNORMAL LOW (ref 3.87–5.11)
RDW: 16 % — ABNORMAL HIGH (ref 11.5–15.5)
WBC: 10.8 10*3/uL — ABNORMAL HIGH (ref 4.0–10.5)
nRBC: 0.2 % (ref 0.0–0.2)

## 2020-07-10 LAB — PHOSPHORUS: Phosphorus: 3.4 mg/dL (ref 2.5–4.6)

## 2020-07-10 LAB — MAGNESIUM: Magnesium: 1.8 mg/dL (ref 1.7–2.4)

## 2020-07-10 LAB — BRAIN NATRIURETIC PEPTIDE: B Natriuretic Peptide: 1474.2 pg/mL — ABNORMAL HIGH (ref 0.0–100.0)

## 2020-07-10 MED ORDER — FUROSEMIDE 10 MG/ML IJ SOLN
40.0000 mg | Freq: Once | INTRAMUSCULAR | Status: AC
  Administered 2020-07-10: 40 mg via INTRAVENOUS
  Filled 2020-07-10: qty 4

## 2020-07-10 NOTE — TOC Initial Note (Addendum)
Transition of Care [facility]) - Initial/Assessment Note    Patient Details  Name: Kristina [name] MRN: [id number] Date of Birth: [birth year]  [facility]) CM/SW Contact:    Bartholomew Crews, RN Phone Number: 07/10/2020, 10:38 AM  Clinical Narrative:                  Spoke with patient at the bedside. Patient very hard of hearing. Patient stated that she is agreeable to SNF. Stated that she has been in a SNF before, and would prefer a different SNF. Permission received to contact her daughter. Left voicemail with contact information. Call back pending.   NCM had previously been contacted by liaison for [facility] to advise that patient is active for home health.   TOC following for transition needs.   Expected Discharge Plan: Skilled Nursing Facility Barriers to Discharge: Continued Medical Work up   Patient Goals and CMS Choice   CMS Medicare.gov Compare Post Acute Care list provided to:: Patient    Expected Discharge Plan and Services Expected Discharge Plan: [place] In-house Referral: Clinical Social Work Discharge Planning Services: CM Consult Post Acute Care Choice: [place] arrangements for the past 2 months: Single Family Home                 DME Arranged: N/A DME Agency: NA       HH Arranged: NA [place] Agency: NA        Prior Living Arrangements/Services Living arrangements for the past 2 months: [place] Lives with:: Self Patient language and need for interpreter reviewed:: Yes Do you feel safe going back to the place where you live?: Yes      Need for Family Participation in Patient Care: Yes (Comment)   Current home services: Home PT,Home RN (active with [facility]) Criminal Activity/Legal Involvement Pertinent to Current Situation/Hospitalization: No - Comment as needed  Activities of Daily Living Home Assistive Devices/Equipment: None ADL Screening (condition at time of admission) Patient's  cognitive ability adequate to safely complete daily activities?: Yes Is the patient deaf or have difficulty hearing?: Yes Does the patient have difficulty seeing, even when wearing glasses/contacts?: No Does the patient have difficulty concentrating, remembering, or making decisions?: No Patient able to express need for assistance with ADLs?: Yes Does the patient have difficulty dressing or bathing?: No Independently performs ADLs?: Yes (appropriate for developmental age) Communication: Independent Does the patient have difficulty walking or climbing stairs?: Yes Weakness of Legs: Both Weakness of Arms/Hands: None  Permission Sought/Granted Permission sought to share information with : Family Supports          Permission granted to share info w Relationship: daughter     Emotional Assessment Appearance:: Appears stated age Attitude/Demeanor/Rapport: Engaged Affect (typically observed): Accepting Orientation: : Oriented to Self,Oriented to Place Alcohol / Substance Use: Not Applicable Psych Involvement: No (comment)  Admission diagnosis:  Heme positive stool [R19.5] Cellulitis of left lower extremity [L03.116] Symptomatic anemia [D64.9] Patient Active Problem List   Diagnosis Date Noted  . Protein-calorie malnutrition, severe 07/10/2020  . Symptomatic anemia 07/06/2020  . GI bleed 07/06/2020  . Sepsis due to cellulitis ([place]) 07/06/2020  . Lt Hip fracture ([place]) 08/17/2017  . Fecal impaction in rectum ([place]) 08/17/2017  . Bilateral carotid bruits 07/18/2017  . Dizziness 12/09/2016  . Weakness 12/09/2016  . Malnutrition of moderate degree 12/03/2016  . Acute hyponatremia 12/01/2016  . Protein-calorie malnutrition ([place]) 12/01/2016  . Anemia 12/01/2016  . Pancreatic calcification 12/01/2016  .  Abdominal pain, epigastric 12/01/2016  . Hyponatremia   . Claudication ([place]) 06/27/2016  . Atherosclerotic PVD with intermittent claudication - near occlusive SFA disease with  moderate iliac disease 07/11/2013    Class: Diagnosis of  . Essential hypertension   . Hyperlipidemia with target low density lipoprotein (LDL) cholesterol less than 70 mg/dL   . Chronic kidney disease (CKD), stage III (moderate) (HCC)   . AAA (abdominal aortic aneurysm) ([place])    PCP:  Holland Commons, FNP Pharmacy:   [facility] [place], [place] - [address] [place] [address] Phone: [phone] Fax: [phone]  [facility] [address], [place] [address] Phone: [phone] Fax: [phone]     Social Determinants of Health (SDOH) Interventions    Readmission Risk Interventions No flowsheet data found.

## 2020-07-10 NOTE — Progress Notes (Addendum)
Notified MD Pokhrel via secure chat that the pt's breathing was labored and that pt was in afib with no VTE. Pt can not wear SCDs to wounds on bilateral legs.  According to the pt's chart, heart rhythm was afib since 12/9 but NSR was charted on 12/6. MD aware.    MD ordered a STAT CXR and IV lasix (see MAR).  Pt was also given a PRN neb as ordered. Charge RN  Angela Nevin, also notified.    Paulla Fore, RN, BSN

## 2020-07-10 NOTE — TOC Progression Note (Signed)
Transition of Care [facility]) - Progression Note    Patient Details  Name: Kristina [name] MRN: [id number] Date of Birth: 10-09-1925  [facility] CM/SW Contact  Bartholomew Crews, RN Phone Number: [phone] 07/10/2020, 4:25 PM  Clinical Narrative:     Left message for daughter, [name], with 4 bed offers ([facility], [facility], [facility], and [facility]).   Expected Discharge Plan: [place] Barriers to Discharge: Continued Medical Work up  Expected Discharge Plan and Services Expected Discharge Plan: [place] In-house Referral: Clinical Social Work Discharge Planning Services: CM Consult Post Acute Care Choice: [place] arrangements for the past 2 months: Single Family Home                 DME Arranged: N/A DME Agency: NA       HH Arranged: NA HH Agency: NA         Social Determinants of Health (SDOH) Interventions    Readmission Risk Interventions No flowsheet data found.

## 2020-07-10 NOTE — Progress Notes (Signed)
OT Cancellation Note  Patient Details Name: REAGANN DOLCE MRN: [id number] DOB: August 21, 1925   Cancelled Treatment:    Reason Eval/Treat Not Completed: Medical issues which prohibited therapy. Spoke with RN who reports pt. Having labored breathing different from her baseline.  rn to provide breathing tx. And is ordering a chest xray.  Agree it is best to hold skilled OT for now. Will check back as pt. Stable for participation.    Daiva Huge Lorraine-COTA/L 07/10/2020, 11:10 AM

## 2020-07-10 NOTE — Plan of Care (Signed)
  Problem: Coping: Goal: Level of anxiety will decrease Outcome: Progressing   Problem: Pain Managment: Goal: General experience of comfort will improve Outcome: Progressing   

## 2020-07-10 NOTE — Progress Notes (Addendum)
PROGRESS NOTE    Kristina [name]  IRC:[id number] DOB: 07-Feb-1926 DOA: 07/06/2020 PCP: Holland Commons, FNP   Brief Narrative:  86 years old female with past medical history of hypertension, hyperlipidemia, chronic anemia, CKD stage III, solitary kidney, AAA, peripheral arterial disease presented to the hospital with left leg wound which was worsening in nature with decreased energy.  Patient was found to have a white count of 12, hemoglobin 5.2, occult blood positive. Patient received 2 unit of packed red blood cells for anemia.Marland Kitchen  X-ray of the right ankle revealed soft tissue swelling with no signs of bone infection.GI was consulted, underwent endoscopy 12/07 which showed benign appearing esophageal stenosis, 2 cm hiatal hernia, normal stomach.  No bleeding duodenal ulcers with no stigmata of bleeding.  GI recommended oral Protonix, follow-up biopsy results, follow-up as an outpatient with GI. Patient also presented with sepsis secondary to left lower extremity cellulitis.  She was a started on IV Ancef and oral doxycycline.  Wound care consulted and following.  Redness left lower extremity has improved, but patient  still benefit from IV antibiotics.  PT is recommending  skilled nursing facility.  Social worker consulted for placement.  Assessment & Plan:   Principal Problem:   Sepsis due to cellulitis [facility]) Active Problems:   Atherosclerotic PVD with intermittent claudication - near occlusive SFA disease with moderate iliac disease   Essential hypertension   Chronic kidney disease (CKD), stage III (moderate) (HCC)   Protein-calorie malnutrition (HCC)   Symptomatic anemia   GI bleed   Protein-calorie malnutrition, severe  Sepsis secondary to left leg cellulitis: Continue Ancef and doxycycline.  Follow wound care.  Trending down WBC.   Blood cultures negative so far.  Continue wound care. Could transition to Keflex and doxycycline on discharge to complete 7 to 10-day course. CBC Latest  Ref Rng & Units 07/10/2020 07/09/2020 07/08/2020  WBC 4.0 - 10.5 K/uL 10.8(H) 10.8(H) 8.8  Hemoglobin 12.0 - 15.0 g/dL 8.4(L) 8.0(L) 7.7(L)  Hematocrit 36.0 - 46.0 % 28.0(L) 27.2(L) 26.2(L)  Platelets 150 - 400 K/uL 340 313 332    Acute symptomatic anemia. Status post 2 units of packed RBC.  Upper GI endoscopy showed evidence of nonbleeding duodenal ulcer.  Hemoglobin stable at 8.0.  Continue Protonix.  Follow biopsy.    Mild dyspnea, shortness of breath today. Positive fluid balance. We will get x-ray of the chest stat. Might need Lasix today.  Had received blood transfusion and IV fluids..  Chest x-ray slightly reviewed by me showed some congestion.  Will give 1 dose of IV Lasix.  Reassess the patient in the afternoon.  Continue supplemental oxygen.  Could consider IV diuretics in a.m. if needed  Duodenal ulcer.  Status post EGD.  Continue Protonix.  Follow biopsy  Essential HTN;  Continue with hydralazine.  Monitor blood pressure closely.  CKD stage IIIb Cr range 1.1--1.3. continue to monitor.  Creatinine of 1.3 today.  Severe protein calorie malnutrition; present on admission, dietitian on board.  Continue supplements.    Peripheral vascular disease.  Continue to hold aspirin due to symptomatic anemia  Acute metabolic encephalopathy:  Continue treatment for infection. More alert awake and communicative today but appears mildly dyspneic  Deep tissue injury of the right ischial tuberosity.,  Left lateral leg and medial leg wounds with cellulitis. Present on admission.  Continue wound care.  Continue antibiotic.  Pressure Injury 07/06/20 Ischial tuberosity Right Deep Tissue Pressure Injury - Purple or maroon localized area of discolored intact skin  or blood-filled blister due to damage of underlying soft tissue from pressure and/or shear. (Active)  07/06/20 2350  Location: Ischial tuberosity  Location Orientation: Right  Staging: Deep Tissue Pressure Injury - Purple or maroon  localized area of discolored intact skin or blood-filled blister due to damage of underlying soft tissue from pressure and/or shear.  Wound Description (Comments):   Present on Admission: Yes    DVT prophylaxis: SCD if possible, holding Lovenox due significant anemia  Code Status: DNR  Family Communication:  Spoke with the patient's daughter on the phone yesterday  Disposition Plan:  Status is: Inpatient  Remains inpatient appropriate because:Ongoing diagnostic testing needed not appropriate for outpatient work up, IV antibiotics, need for skilled nursing facility placement, new dyspnea.  Dispo: The patient is from: Home              Anticipated d/c is to:  skilled nursing facility as per PT evaluation.               Anticipated d/c date is: 2 days              Patient currently is not medically stable to d/c.  New dyspnea  Consultants:   GI  Procedures:   Upper GI endoscopy with biopsy 07/07/20  Antimicrobials:  Ancef Doxycycline  Subjective: Today, patient was seen and examined at bedside. More alert awake but appears to be little more dyspneic today. Denies any pain, nausea, vomiting or fever.  Objective: Vitals:   07/09/20 0830 07/09/20 1640 07/09/20 2205 07/10/20 0416  BP: 121/63 (!) 149/81 130/65 (!) 153/74  Pulse: 78 83 94 87  Resp: 19 20 20 18   Temp: 98 F (36.7 C) 99 F (37.2 C) 99.4 F (37.4 C) 98 F (36.7 C)  TempSrc:  Oral Oral   SpO2: 92% 96% 95% 99%  Weight:   43.3 kg     Intake/Output Summary (Last 24 hours) at 07/10/2020 0820 Last data filed at 07/10/2020 0600 Gross per 24 hour  Intake 567.6 ml  Output 0 ml  Net 567.6 ml   Filed Weights   07/07/20 2236 07/08/20 2050 07/09/20 2205  Weight: 38.7 kg 38.7 kg 43.3 kg   Body mass index is 19.95 kg/m.  Physical examination: General: Thinly built, not in obvious distress, confused, answering few questions, more alert awake today sounds congested. Nasal Cannula oxygen at 2 L. HENT:   No  scleral pallor or icterus noted. Oral mucosa is moist.  Chest: Results noted, diminished breath sounds bilaterally. CVS: S1 &S2 heard. No murmur.  Regular rate and rhythm. Abdomen: Soft, nontender, nondistended.  Bowel sounds are heard.   Extremities: Peripheral pulses are palpable. Bilateral lower extremity with elastic compression bandage Psych: answering yes or now CNS:  No cranial nerve deficits.  Moving extremities.  Confused  Skin: Warm and dry.  No rashes noted.  Deep tissue injury of the ischial tuberosity.  Left lateral leg with 3 x 1 x 0.2 cm wound and left medial leg with 12 x 2.5 x 0.2 cm wound presentation, currently on elastic compression bandage..   Data Reviewed: I have personally reviewed following labs and imaging studies  CBC: Recent Labs  Lab 07/06/20 1026 07/07/20 0024 07/08/20 0244 07/09/20 0033 07/10/20 0319  WBC 12.2* 9.1 8.8 10.8* 10.8*  NEUTROABS 10.6*  --   --   --   --   HGB 5.2* 7.7* 7.7* 8.0* 8.4*  HCT 18.0* 24.6* 26.2* 27.2* 28.0*  MCV 99.4 92.5 95.6 96.8 95.6  PLT 433* 324 332 313 678   Basic Metabolic Panel: Recent Labs  Lab 07/06/20 1026 07/07/20 0024 07/08/20 0244 07/09/20 0033 07/10/20 0319  NA 138 138 139 138 139  K 4.5 4.4 5.0 4.8 4.5  CL 102 104 104 106 103  CO2 24 25 27 23 25   GLUCOSE 93 163* 87 109* 141*  BUN 47* 48* 49* 48* 48*  CREATININE 1.35* 1.47* 1.43* 1.42* 1.39*  CALCIUM 9.2 8.6* 8.7* 8.7* 8.9  MG  --   --   --   --  1.8  PHOS  --   --   --   --  3.4   GFR: Estimated Creatinine Clearance: 16.7 mL/min (A) (by C-G formula based on SCr of 1.39 mg/dL (H)). Liver Function Tests: Recent Labs  Lab 07/06/20 1026 07/10/20 0319  AST 32 21  ALT 27 7  ALKPHOS 80 86  BILITOT 0.6 0.7  PROT 6.0* 5.7*  ALBUMIN 3.1* 2.5*   No results for input(s): LIPASE, AMYLASE in the last 168 hours. No results for input(s): AMMONIA in the last 168 hours. Coagulation Profile: No results for input(s): INR, PROTIME in the last 168  hours. Cardiac Enzymes: No results for input(s): CKTOTAL, CKMB, CKMBINDEX, TROPONINI in the last 168 hours. BNP (last 3 results) No results for input(s): PROBNP in the last 8760 hours. HbA1C: No results for input(s): HGBA1C in the last 72 hours. CBG: Recent Labs  Lab 07/07/20 1729 07/09/20 0836  GLUCAP 80 82   Lipid Profile: No results for input(s): CHOL, HDL, LDLCALC, TRIG, CHOLHDL, LDLDIRECT in the last 72 hours. Thyroid Function Tests: No results for input(s): TSH, T4TOTAL, FREET4, T3FREE, THYROIDAB in the last 72 hours. Anemia Panel: No results for input(s): VITAMINB12, FOLATE, FERRITIN, TIBC, IRON, RETICCTPCT in the last 72 hours. Sepsis Labs: Recent Labs  Lab 07/06/20 1026  LATICACIDVEN 1.1    Recent Results (from the past 240 hour(s))  Blood culture (routine x 2)     Status: None (Preliminary result)   Collection Time: 07/06/20  1:01 PM   Specimen: BLOOD LEFT ARM  Result Value Ref Range Status   Specimen Description BLOOD LEFT ARM  Final   Special Requests   Final    BOTTLES DRAWN AEROBIC AND ANAEROBIC Blood Culture adequate volume   Culture   Final    NO GROWTH 4 DAYS Performed at [facility], [address] [place], [place] [address]    Report Status PENDING  Incomplete  Blood culture (routine x 2)     Status: None (Preliminary result)   Collection Time: 07/06/20  1:01 PM   Specimen: BLOOD RIGHT ARM  Result Value Ref Range Status   Specimen Description BLOOD RIGHT ARM  Final   Special Requests   Final    BOTTLES DRAWN AEROBIC AND ANAEROBIC Blood Culture results may not be optimal due to an inadequate volume of blood received in culture bottles   Culture   Final    NO GROWTH 4 DAYS Performed at [facility], [place] [address], [place], [place] [address]    Report Status PENDING  Incomplete  Resp Panel by RT-PCR (Flu A&B, Covid) Nasopharyngeal Swab     Status: None   Collection Time: 07/06/20  4:43 PM   Specimen: Nasopharyngeal Swab;  Nasopharyngeal(NP) swabs in vial transport medium  Result Value Ref Range Status   SARS Coronavirus 2 by RT PCR NEGATIVE NEGATIVE Final    Comment: (NOTE) SARS-CoV-2 target nucleic acids are NOT DETECTED.  The SARS-CoV-2 RNA is generally detectable in upper respiratory specimens during the acute phase of infection. The lowest concentration of SARS-CoV-2 viral copies this assay can detect is 138 copies/mL. A negative result does not preclude SARS-Cov-2 infection and should not be used as the sole basis for treatment or other patient management decisions. A negative result may occur with  improper specimen collection/handling, submission of specimen other than nasopharyngeal swab, presence of viral mutation(s) within the areas targeted by this assay, and inadequate number of viral copies(<138 copies/mL). A negative result must be combined with clinical observations, patient history, and epidemiological information. The expected result is Negative.  Fact Sheet for Patients:  EntrepreneurPulse.com.au  Fact Sheet for Healthcare Providers:  IncredibleEmployment.be  This test is no t yet approved or cleared by the [place] FDA and  has been authorized for detection and/or diagnosis of SARS-CoV-2 by FDA under an Emergency Use Authorization (EUA). This EUA will remain  in effect (meaning this test can be used) for the duration of the COVID-19 declaration under Section 564(b)(1) of the Act, 21 U.S.C.section 360bbb-3(b)(1), unless the authorization is terminated  or revoked sooner.       Influenza A by PCR NEGATIVE NEGATIVE Final   Influenza B by PCR NEGATIVE NEGATIVE Final    Comment: (NOTE) The Xpert Xpress SARS-CoV-2/FLU/RSV plus assay is intended as an aid in the diagnosis of influenza from Nasopharyngeal swab specimens and should not be used as a sole basis for treatment. Nasal washings and aspirates are unacceptable for Xpert Xpress  SARS-CoV-2/FLU/RSV testing.  Fact Sheet for Patients: EntrepreneurPulse.com.au  Fact Sheet for Healthcare Providers: IncredibleEmployment.be  This test is not yet approved or cleared by the [place] FDA and has been authorized for detection and/or diagnosis of SARS-CoV-2 by FDA under an Emergency Use Authorization (EUA). This EUA will remain in effect (meaning this test can be used) for the duration of the COVID-19 declaration under Section 564(b)(1) of the Act, 21 U.S.C. section 360bbb-3(b)(1), unless the authorization is terminated or revoked.  Performed at [facility], [place] [address] [place], [place] [address]      Radiology Studies: No results found.    Scheduled Meds: . acidophilus  1 capsule Oral QPC lunch  . doxycycline  100 mg Oral Q12H  . feeding supplement  237 mL Oral TID BM  . hydrALAZINE  25 mg Oral BID  . multivitamin with minerals  1 tablet Oral Daily  . pantoprazole  40 mg Oral BID  . sodium chloride flush  3 mL Intravenous Q12H   Continuous Infusions: .  ceFAZolin (ANCEF) IV 1 g (07/10/20 0534)     LOS: 4 days    Flora Lipps, MD [facility] 07/10/2020

## 2020-07-10 NOTE — Plan of Care (Signed)
  Problem: Pain Managment: Goal: General experience of comfort will improve Outcome: Progressing   Problem: Safety: Goal: Ability to remain free from injury will improve Outcome: Progressing   

## 2020-07-10 NOTE — [place] FL2 (Signed)
[place] MEDICAID FL2 LEVEL OF CARE SCREENING TOOL     IDENTIFICATION  Patient Name: Kristina [name] Birthdate: 1925/12/06 Sex: female Admission Date (Current Location): 07/06/2020  [facility] and [place] Number:  Herbalist and Address:  [facility] [place], [place] [address] [place], [place] [address]      Provider Number: [id number]  Attending Physician Name and Address:  Flora Lipps, MD  Relative Name and Phone Number:       Current Level of Care: Hospital Recommended Level of Care: [place] Prior Approval Number:    Date Approved/Denied:   PASRR Number: [id number]  Discharge Plan: SNF    Current Diagnoses: Patient Active Problem List   Diagnosis Date Noted  . Protein-calorie malnutrition, severe 07/10/2020  . Symptomatic anemia 07/06/2020  . GI bleed 07/06/2020  . Sepsis due to cellulitis ([place]) 07/06/2020  . Lt Hip fracture ([place]) 08/17/2017  . Fecal impaction in rectum ([place]) 08/17/2017  . Bilateral carotid bruits 07/18/2017  . Dizziness 12/09/2016  . Weakness 12/09/2016  . Malnutrition of moderate degree 12/03/2016  . Acute hyponatremia 12/01/2016  . Protein-calorie malnutrition ([place]) 12/01/2016  . Anemia 12/01/2016  . Pancreatic calcification 12/01/2016  . Abdominal pain, epigastric 12/01/2016  . Hyponatremia   . Claudication ([place]) 06/27/2016  . 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 Essential hypertension   . Hyperlipidemia with target low density lipoprotein (LDL) cholesterol less than 70 mg/dL   . Chronic kidney disease (CKD), stage III (moderate) (HCC)   . AAA (abdominal aortic aneurysm) (HCC)     Orientation RESPIRATION BLADDER Height & Weight     Self,Place  Normal Continent Weight: 43.3 kg Height:     BEHAVIORAL SYMPTOMS/MOOD NEUROLOGICAL BOWEL NUTRITION STATUS      Continent Diet  AMBULATORY STATUS COMMUNICATION OF  NEEDS Skin   Extensive Assist Verbally Bruising,Other (Comment) (bruising to arms/legs; cellulitis to bilateral legs)                       Personal Care Assistance Level of Assistance  Bathing,Feeding,Dressing Bathing Assistance: Maximum assistance Feeding assistance: Maximum assistance Dressing Assistance: Maximum assistance     Functional Limitations Info  Sight,Hearing,Speech Sight Info: Adequate Hearing Info: Impaired Speech Info: Adequate    SPECIAL CARE FACTORS FREQUENCY  PT (By licensed PT),OT (By licensed OT)     PT Frequency: PT at SNF to eval and treat a min of 5x/week OT Frequency: OT at SNF to eval and treat a min of 5x/week            Contractures Contractures Info: Not present    Additional Factors Info  Code Status,Allergies Code Status Info: DNR Allergies Info: lipitor, pletal, simvastatin           Current Medications (07/10/2020):  This is the current hospital active medication list Current Facility-Administered Medications  Medication Dose Route Frequency Provider Last Rate Last Admin  . acetaminophen (TYLENOL) tablet 650 mg  650 mg Oral Q6H PRN Fuller Plan A, MD   650 mg at 07/10/20 1239   Or  . acetaminophen (TYLENOL) suppository 650 mg  650 mg Rectal Q6H PRN Fuller Plan A, MD      . acidophilus (RISAQUAD) capsule 1 capsule  1 capsule Oral QPC lunch Fuller Plan A, MD   1 capsule at 07/10/20 1238  . albuterol (PROVENTIL) (2.5 MG/3ML) 0.083% nebulizer solution 2.5 mg  2.5 mg Nebulization  Q6H PRN Fuller Plan A, MD   2.5 mg at 07/10/20 1108  . ceFAZolin (ANCEF) IVPB 1 g/50 mL premix  1 g Intravenous Q12H Pham, Minh Q, RPH-CPP 100 mL/hr at 07/10/20 0534 1 g at 07/10/20 0534  . doxycycline (VIBRA-TABS) tablet 100 mg  100 mg Oral Q12H Regalado, Belkys A, MD   100 mg at 07/10/20 1005  . feeding supplement (ENSURE ENLIVE / ENSURE PLUS) liquid 237 mL  237 mL Oral TID BM Regalado, Belkys A, MD   237 mL at 07/10/20 1005  . hydrALAZINE  (APRESOLINE) tablet 25 mg  25 mg Oral BID Regalado, Belkys A, MD   25 mg at 07/10/20 1005  . multivitamin with minerals tablet 1 tablet  1 tablet Oral Daily Regalado, Belkys A, MD   1 tablet at 07/10/20 1005  . ondansetron (ZOFRAN) tablet 4 mg  4 mg Oral Q6H PRN Fuller Plan A, MD       Or  . ondansetron (ZOFRAN) injection 4 mg  4 mg Intravenous Q6H PRN Fuller Plan A, MD   4 mg at 07/07/20 1340  . pantoprazole (PROTONIX) EC tablet 40 mg  40 mg Oral BID Regalado, Belkys A, MD   40 mg at 07/10/20 1005  . sodium chloride flush (NS) 0.9 % injection 3 mL  3 mL Intravenous Q12H Smith, Rondell A, MD   3 mL at 07/10/20 1005     Discharge Medications: Please see discharge summary for a list of discharge medications.  Relevant Imaging Results:  Relevant Lab Results:   Additional Information SS#: [id number]  Bartholomew Crews, RN

## 2020-07-10 NOTE — TOC Progression Note (Signed)
Transition of Care [facility]) - Progression Note    Patient Details  Name: SHAWNEE GAMBONE MRN: [id number] Date of Birth: 1926-07-09  Transition [facility]) CM/SW Contact  Bartholomew Crews, RN Phone Number: [phone] 07/10/2020, 12:48 PM  Clinical Narrative:     Received call back from patient's daughter, [name], at [phone]. Discussed recommendations for SNF. Agreeable to bed search. FL2 completed and faxed to area facilities.   PTA patient living alone. [name] was staying with her at nights. Patient's son lives in home behind patient's. [facility] was active for RN and PT. [name] provided transportation to medical appointments. Family provided all meals.   TOC following for transition needs.   Expected Discharge Plan: [place] Barriers to Discharge: Continued Medical Work up  Expected Discharge Plan and Services Expected Discharge Plan: [place] In-house Referral: Clinical Social Work Discharge Planning Services: CM Consult Post Acute Care Choice: [place] arrangements for the past 2 months: Single Family Home                 DME Arranged: N/A DME Agency: NA       HH Arranged: NA HH Agency: NA         Social Determinants of Health (SDOH) Interventions    Readmission Risk Interventions No flowsheet data found.

## 2020-07-11 LAB — COMPREHENSIVE METABOLIC PANEL
ALT: 6 U/L (ref 0–44)
AST: 21 U/L (ref 15–41)
Albumin: 2.6 g/dL — ABNORMAL LOW (ref 3.5–5.0)
Alkaline Phosphatase: 77 U/L (ref 38–126)
Anion gap: 9 (ref 5–15)
BUN: 53 mg/dL — ABNORMAL HIGH (ref 8–23)
CO2: 29 mmol/L (ref 22–32)
Calcium: 9 mg/dL (ref 8.9–10.3)
Chloride: 102 mmol/L (ref 98–111)
Creatinine, Ser: 1.38 mg/dL — ABNORMAL HIGH (ref 0.44–1.00)
GFR, Estimated: 36 mL/min — ABNORMAL LOW (ref >60)
Glucose, Bld: 102 mg/dL — ABNORMAL HIGH (ref 70–99)
Potassium: 4.1 mmol/L (ref 3.5–5.1)
Sodium: 140 mmol/L (ref 135–145)
Total Bilirubin: 0.5 mg/dL (ref 0.3–1.2)
Total Protein: 5.5 g/dL — ABNORMAL LOW (ref 6.5–8.1)

## 2020-07-11 LAB — CULTURE, BLOOD (ROUTINE X 2)
Culture: NO GROWTH
Culture: NO GROWTH
Special Requests: ADEQUATE

## 2020-07-11 LAB — CBC
HCT: 27.5 % — ABNORMAL LOW (ref 36.0–46.0)
Hemoglobin: 8.3 g/dL — ABNORMAL LOW (ref 12.0–15.0)
MCH: 28.6 pg (ref 26.0–34.0)
MCHC: 30.2 g/dL (ref 30.0–36.0)
MCV: 94.8 fL (ref 80.0–100.0)
Platelets: 328 10*3/uL (ref 150–400)
RBC: 2.9 MIL/uL — ABNORMAL LOW (ref 3.87–5.11)
RDW: 15.9 % — ABNORMAL HIGH (ref 11.5–15.5)
WBC: 10.6 10*3/uL — ABNORMAL HIGH (ref 4.0–10.5)
nRBC: 0.2 % (ref 0.0–0.2)

## 2020-07-11 LAB — MAGNESIUM: Magnesium: 1.8 mg/dL (ref 1.7–2.4)

## 2020-07-11 LAB — GLUCOSE, CAPILLARY: Glucose-Capillary: 133 mg/dL — ABNORMAL HIGH (ref 70–99)

## 2020-07-11 LAB — PHOSPHORUS: Phosphorus: 3.4 mg/dL (ref 2.5–4.6)

## 2020-07-11 MED ORDER — MUSCLE RUB 10-15 % EX CREA
TOPICAL_CREAM | CUTANEOUS | Status: DC | PRN
  Administered 2020-07-12: 1 via TOPICAL
  Filled 2020-07-11: qty 85

## 2020-07-11 NOTE — Progress Notes (Signed)
PROGRESS NOTE    CHELISE HANGER  PYP:[id number] DOB: 1926-02-28 DOA: 07/06/2020 PCP: Holland Commons, FNP   Brief Narrative:  Per admitting MD: 86 years old female with past medical history of hypertension, hyperlipidemia, chronic anemia, CKD stage III, solitary kidney, AAA, peripheral arterial disease presented to the hospital with left leg wound which was worsening in nature with decreased energy.  Patient was found to have a white count of 12, hemoglobin 5.2, occult blood positive. Patient received 2 unit of packed red blood cells for anemia.Marland Kitchen  X-ray of the right ankle revealed soft tissue swelling with no signs of bone infection.GI was consulted, underwent endoscopy 12/07 which showed benign appearing esophageal stenosis, 2 cm hiatal hernia, normal stomach.  No bleeding duodenal ulcers with no stigmata of bleeding.  GI recommended oral Protonix, follow-up biopsy results, follow-up as an outpatient with GI. Patient also presented with sepsis secondary to left lower extremity cellulitis.  She was a started on IV Ancef and oral doxycycline.  Wound care consulted and following.  Redness left lower extremity has improved, but patient  still benefit from IV antibiotics.  PT is recommending  skilled nursing facility.  Social worker consulted for placement.  Assessment & Plan:   Principal Problem:   Sepsis due to cellulitis [facility]) Active Problems:   Atherosclerotic PVD with intermittent claudication - near occlusive SFA disease with moderate iliac disease   Essential hypertension   Chronic kidney disease (CKD), stage III (moderate) (HCC)   Protein-calorie malnutrition (HCC)   Symptomatic anemia   GI bleed   Protein-calorie malnutrition, severe  Sepsis secondary to left leg cellulitis: Continue Ancef and doxycycline.  Following wound care recs  Trending down WBC.   Blood cultures negative x 5 days.  Continue wound care. Could transition to Keflex and doxycycline on discharge to complete 7 to  10-day course.  Acute symptomatic anemia. Status post 2 units of packed RBC.  Upper GI endoscopy showed evidence of nonbleeding duodenal ulcer.  Hemoglobin stable at 8.3.  Continue Protonix.  Follow biopsy.    Mild dyspnea, shortness of breath improved today. Positive fluid balance..  Had received blood transfusion and IV fluids..  Received one dose of lasix Friday afer fluoid/PRBC.  Continue supplemental oxygen.  Could consider IV diuretics in a.m. if needed  Duodenal ulcer.  Status post EGD.  Continue Protonix.  Follow biopsy/BI recs  Essential HTN;  Continue with hydralazine.  Monitor blood pressure closely.  CKD stage IIIb Cr range 1.1--1.3. continue to monitor.  Creatinine of 1.38 today.  Severe protein calorie malnutrition; present on admission, dietitian on board.  Continue supplements.    Peripheral vascular disease.  Continue to hold aspirin due to symptomatic anemia  Acute metabolic encephalopathy:  Continue treatment for infection. More alert awake and communicative today but appears mildly dyspneic  Deep tissue injury of the right ischial tuberosity.,  Left lateral leg and medial leg wounds with cellulitis. Present on admission.  Continue wound care.  Continue antibiotic.  Pressure Injury 07/06/20 Ischial tuberosity Right Deep Tissue Pressure Injury - Purple or maroon localized area of discolored intact skin or blood-filled blister due to damage of underlying soft tissue from pressure and/or shear. (Active)  07/06/20 2350  Location: Ischial tuberosity  Location Orientation: Right  Staging: Deep Tissue Pressure Injury - Purple or maroon localized area of discolored intact skin or blood-filled blister due to damage of underlying soft tissue from pressure and/or shear.  Wound Description (Comments):   Present on Admission: Yes  DVT prophylaxis: SCD/Compression stockings  Code Status: DNR    Code Status Orders  (From admission, onward)         Start      Ordered   07/06/20 1554  Do not attempt resuscitation (DNR)  Continuous       Question Answer Comment  In the event of cardiac or respiratory ARREST Do not call a "code blue"   In the event of cardiac or respiratory ARREST Do not perform Intubation, CPR, defibrillation or ACLS   In the event of cardiac or respiratory ARREST Use medication by any route, position, wound care, and other measures to relive pain and suffering. May use oxygen, suction and manual treatment of airway obstruction as needed for comfort.      07/06/20 1557        Code Status History    Date Active Date Inactive Code Status Order ID Comments User Context   07/06/2020 1518 07/06/2020 1557 DNR [id number]  Norval Morton, MD ED   08/17/2017 1848 08/21/2017 2329 Full Code [id number]  Roxan Hockey, MD Inpatient   12/09/2016 1846 12/12/2016 1717 Full Code [id number]  Eugenie Filler, MD Inpatient   12/01/2016 2042 12/03/2016 1645 Full Code [id number]  Gardiner Barefoot, NP Inpatient   12/01/2016 1017 12/01/2016 2042 DNR [id number]  Samella Parr, NP Inpatient   12/01/2016 1613 12/01/2016 1653 Full Code [id number]  Samella Parr, NP Inpatient   Advance Care Planning Activity    Advance Directive Documentation   Flowsheet Row Most Recent Value  Type of Advance Directive Out of facility DNR (pink MOST or yellow form)  Pre-existing out of facility DNR order (yellow form or pink MOST form) Yellow form placed in chart (order not valid for inpatient use)  "MOST" Form in Place? --     Family Communication: DISCUSSED WITH DAUGHTER ON SAT Disposition Plan:   Patient remained inpatient for continued management to include antibiotics, frequent nurse assessment, electrolyte management,.  Once stable will be discharged to skilled nursing facility Consults called: None Admission status: Inpatient   Consultants:   GI  Procedures:  DG Ankle Complete Left  Result Date: 07/06/2020 CLINICAL DATA:  Soft tissue infection EXAM: LEFT  ANKLE COMPLETE - 3+ VIEW COMPARISON:  None. FINDINGS: Soft tissue swelling at the ankle. No erosive changes. No periosteal reaction. Degenerative changes at the posterior talocalcaneal articulation. Joint spaces are otherwise preserved. IMPRESSION: Soft tissue swelling at the ankle. No radiographic evidence of osteomyelitis. Electronically Signed   By: Macy Mis M.D.   On: 07/06/2020 13:57   DG Chest Port 1 View  Result Date: 07/10/2020 CLINICAL DATA:  Encounter for dyspnea EXAM: PORTABLE CHEST 1 VIEW COMPARISON:  12/09/2019 and prior FINDINGS: No pneumothorax. Trace bilateral effusions, new. Patchy bibasilar opacities. Stable cardiomegaly and aortic atherosclerotic calcifications. Multilevel spondylosis and osteopenia. IMPRESSION: Bibasilar opacities and trace effusions. Differential includes edema versus infection. Stable cardiomegaly. Electronically Signed   By: Primitivo Gauze M.D.   On: 07/10/2020 11:31     Antimicrobials:   MAR   Subjective: No acute changes overnight resting comfortably in bed. Cooperative  Objective: Vitals:   07/10/20 1845 07/10/20 2003 07/11/20 0522 07/11/20 1000  BP: (!) 158/94 (!) 156/90 (!) 155/92 (!) 154/88  Pulse: 92 93 90 (!) 104  Resp: 19 18 18 20   Temp: 98.2 F (36.8 C) 98.1 F (36.7 C) 98.2 F (36.8 C) 98.9 F (37.2 C)  TempSrc:  Oral Oral Oral  SpO2: 99% 98% 98% 99%  Weight:  41.8 kg     Intake/Output Summary (Last 24 hours) at 07/11/2020 1123 Last data filed at 07/11/2020 0100 Gross per 24 hour  Intake 550 ml  Output 1500 ml  Net -950 ml   Filed Weights   07/08/20 2050 07/09/20 2205 07/11/20 0522  Weight: 38.7 kg 43.3 kg 41.8 kg    Examination:  General exam: Appears calm and comfortable, hard of hearing Respiratory system: Clear to auscultation. Respiratory effort normal. Cardiovascular system: S1 & S2 heard, RRR. No JVD, murmurs, rubs, gallops or clicks. No pedal edema. Gastrointestinal system: Abdomen is  nondistended, soft and nontender. No organomegaly or masses felt. Normal bowel sounds heard. Central nervous system: Alert and oriented. No focal neurological deficits. Extremities: Thin cachectic, but warm well perfused neurovascular intact Skin: No rashes, lesions or ulcers Psychiatry: Judgement and insight appear normal. Mood & affect flat    Data Reviewed: I have personally reviewed following labs and imaging studies  CBC: Recent Labs  Lab 07/06/20 1026 07/07/20 0024 07/08/20 0244 07/09/20 0033 07/10/20 0319 07/11/20 0452  WBC 12.2* 9.1 8.8 10.8* 10.8* 10.6*  NEUTROABS 10.6*  --   --   --   --   --   HGB 5.2* 7.7* 7.7* 8.0* 8.4* 8.3*  HCT 18.0* 24.6* 26.2* 27.2* 28.0* 27.5*  MCV 99.4 92.5 95.6 96.8 95.6 94.8  PLT 433* 324 332 313 340 379   Basic Metabolic Panel: Recent Labs  Lab 07/07/20 0024 07/08/20 0244 07/09/20 0033 07/10/20 0319 07/11/20 0452  NA 138 139 138 139 140  K 4.4 5.0 4.8 4.5 4.1  CL 104 104 106 103 102  CO2 25 27 23 25 29   GLUCOSE 163* 87 109* 141* 102*  BUN 48* 49* 48* 48* 53*  CREATININE 1.47* 1.43* 1.42* 1.39* 1.38*  CALCIUM 8.6* 8.7* 8.7* 8.9 9.0  MG  --   --   --  1.8 1.8  PHOS  --   --   --  3.4 3.4   GFR: Estimated Creatinine Clearance: 16.8 mL/min (A) (by C-G formula based on SCr of 1.38 mg/dL (H)). Liver Function Tests: Recent Labs  Lab 07/06/20 1026 07/10/20 0319 07/11/20 0452  AST 32 21 21  ALT 27 7 6   ALKPHOS 80 86 77  BILITOT 0.6 0.7 0.5  PROT 6.0* 5.7* 5.5*  ALBUMIN 3.1* 2.5* 2.6*   No results for input(s): LIPASE, AMYLASE in the last 168 hours. No results for input(s): AMMONIA in the last 168 hours. Coagulation Profile: No results for input(s): INR, PROTIME in the last 168 hours. Cardiac Enzymes: No results for input(s): CKTOTAL, CKMB, CKMBINDEX, TROPONINI in the last 168 hours. BNP (last 3 results) No results for input(s): PROBNP in the last 8760 hours. HbA1C: No results for input(s): HGBA1C in the last 72  hours. CBG: Recent Labs  Lab 07/07/20 1729 07/09/20 0836  GLUCAP 80 82   Lipid Profile: No results for input(s): CHOL, HDL, LDLCALC, TRIG, CHOLHDL, LDLDIRECT in the last 72 hours. Thyroid Function Tests: No results for input(s): TSH, T4TOTAL, FREET4, T3FREE, THYROIDAB in the last 72 hours. Anemia Panel: No results for input(s): VITAMINB12, FOLATE, FERRITIN, TIBC, IRON, RETICCTPCT in the last 72 hours. Sepsis Labs: Recent Labs  Lab 07/06/20 1026  LATICACIDVEN 1.1    Recent Results (from the past 240 hour(s))  Blood culture (routine x 2)     Status: None   Collection Time: 07/06/20  1:01 PM   Specimen: BLOOD LEFT ARM  Result Value Ref Range Status   Specimen  Description BLOOD LEFT ARM  Final   Special Requests   Final    BOTTLES DRAWN AEROBIC AND ANAEROBIC Blood Culture adequate volume   Culture   Final    NO GROWTH 5 DAYS Performed at [facility], [address], [place], [place] [address]    Report Status 07/11/2020 FINAL  Final  Blood culture (routine x 2)     Status: None   Collection Time: 07/06/20  1:01 PM   Specimen: BLOOD RIGHT ARM  Result Value Ref Range Status   Specimen Description BLOOD RIGHT ARM  Final   Special Requests   Final    BOTTLES DRAWN AEROBIC AND ANAEROBIC Blood Culture results may not be optimal due to an inadequate volume of blood received in culture bottles   Culture   Final    NO GROWTH 5 DAYS Performed at [facility], [place] [address], [place], [place] [address]    Report Status 07/11/2020 FINAL  Final  Resp Panel by RT-PCR (Flu A&B, Covid) Nasopharyngeal Swab     Status: None   Collection Time: 07/06/20  4:43 PM   Specimen: Nasopharyngeal Swab; Nasopharyngeal(NP) swabs in vial transport medium  Result Value Ref Range Status   SARS Coronavirus 2 by RT PCR NEGATIVE NEGATIVE Final    Comment: (NOTE) SARS-CoV-2 target nucleic acids are NOT DETECTED.  The SARS-CoV-2 RNA is generally detectable in upper respiratory specimens  during the acute phase of infection. The lowest concentration of SARS-CoV-2 viral copies this assay can detect is 138 copies/mL. A negative result does not preclude SARS-Cov-2 infection and should not be used as the sole basis for treatment or other patient management decisions. A negative result may occur with  improper specimen collection/handling, submission of specimen other than nasopharyngeal swab, presence of viral mutation(s) within the areas targeted by this assay, and inadequate number of viral copies(<138 copies/mL). A negative result must be combined with clinical observations, patient history, and epidemiological information. The expected result is Negative.  Fact Sheet for Patients:  EntrepreneurPulse.com.au  Fact Sheet for Healthcare Providers:  IncredibleEmployment.be  This test is no t yet approved or cleared by the [place] FDA and  has been authorized for detection and/or diagnosis of SARS-CoV-2 by FDA under an Emergency Use Authorization (EUA). This EUA will remain  in effect (meaning this test can be used) for the duration of the COVID-19 declaration under Section 564(b)(1) of the Act, 21 U.S.C.section 360bbb-3(b)(1), unless the authorization is terminated  or revoked sooner.       Influenza A by PCR NEGATIVE NEGATIVE Final   Influenza B by PCR NEGATIVE NEGATIVE Final    Comment: (NOTE) The Xpert Xpress SARS-CoV-2/FLU/RSV plus assay is intended as an aid in the diagnosis of influenza from Nasopharyngeal swab specimens and should not be used as a sole basis for treatment. Nasal washings and aspirates are unacceptable for Xpert Xpress SARS-CoV-2/FLU/RSV testing.  Fact Sheet for Patients: EntrepreneurPulse.com.au  Fact Sheet for Healthcare Providers: IncredibleEmployment.be  This test is not yet approved or cleared by the [place] FDA and has been authorized for detection  and/or diagnosis of SARS-CoV-2 by FDA under an Emergency Use Authorization (EUA). This EUA will remain in effect (meaning this test can be used) for the duration of the COVID-19 declaration under Section 564(b)(1) of the Act, 21 U.S.C. section 360bbb-3(b)(1), unless the authorization is terminated or revoked.  Performed at [facility], [place] [address] [place], [place] [address]  Radiology Studies: DG Chest Port 1 View  Result Date: 07/10/2020 CLINICAL DATA:  Encounter for dyspnea EXAM: PORTABLE CHEST 1 VIEW COMPARISON:  12/09/2019 and prior FINDINGS: No pneumothorax. Trace bilateral effusions, new. Patchy bibasilar opacities. Stable cardiomegaly and aortic atherosclerotic calcifications. Multilevel spondylosis and osteopenia. IMPRESSION: Bibasilar opacities and trace effusions. Differential includes edema versus infection. Stable cardiomegaly. Electronically Signed   By: Primitivo Gauze M.D.   On: 07/10/2020 11:31        Scheduled Meds: . acidophilus  1 capsule Oral QPC lunch  . doxycycline  100 mg Oral Q12H  . feeding supplement  237 mL Oral TID BM  . hydrALAZINE  25 mg Oral BID  . multivitamin with minerals  1 tablet Oral Daily  . pantoprazole  40 mg Oral BID  . sodium chloride flush  3 mL Intravenous Q12H   Continuous Infusions: .  ceFAZolin (ANCEF) IV 1 g (07/11/20 0526)     LOS: 5 days    Time spent: [place], MD [facility]  If 7PM-7AM, please contact night-coverage  07/11/2020, 11:23 AM

## 2020-07-12 LAB — GLUCOSE, CAPILLARY: Glucose-Capillary: 139 mg/dL — ABNORMAL HIGH (ref 70–99)

## 2020-07-12 NOTE — Plan of Care (Signed)
  Problem: Clinical Measurements: Goal: Ability to maintain clinical measurements within normal limits will improve Outcome: Progressing Goal: Will remain free from infection Outcome: Progressing   Problem: Activity: Goal: Risk for activity intolerance will decrease Outcome: Progressing   Problem: Nutrition: Goal: Adequate nutrition will be maintained Outcome: Progressing   Problem: Coping: Goal: Level of anxiety will decrease Outcome: Progressing   Problem: Elimination: Goal: Will not experience complications related to bowel motility Outcome: Progressing Goal: Will not experience complications related to urinary retention Outcome: Progressing   Problem: Pain Managment: Goal: General experience of comfort will improve Outcome: Progressing   Problem: Safety: Goal: Ability to remain free from injury will improve Outcome: Progressing   Problem: Skin Integrity: Goal: Risk for impaired skin integrity will decrease Outcome: Progressing

## 2020-07-12 NOTE — TOC Progression Note (Signed)
Transition of Care [facility]) - Progression Note    Patient Details  Name: Kristina [name] MRN: [id number] Date of Birth: [birth year]  Transition of Care [facility]) CM/SW [place], [place] Phone Number: 07/12/2020, 9:50 AM  Clinical Narrative:      CSW emailed daughter bed offers, waiting for choice.  Expected Discharge Plan: [place] Barriers to Discharge: Continued Medical Work up  Expected Discharge Plan and Services Expected Discharge Plan: [place] In-house Referral: Clinical Social Work Discharge Planning Services: CM Consult Post Acute Care Choice: [place] arrangements for the past 2 months: Single Family Home                 DME Arranged: N/A DME Agency: NA       HH Arranged: NA HH Agency: NA         Social Determinants of Health (SDOH) Interventions    Readmission Risk Interventions No flowsheet data found.

## 2020-07-12 NOTE — Progress Notes (Signed)
PROGRESS NOTE    Kristina [name]  ASN:[id number] DOB: 09-Mar-1926 DOA: 07/06/2020 PCP: Holland Commons, FNP   Brief Narrative:  Per admitting MD: 86 years old female with past medical history of hypertension, hyperlipidemia, chronic anemia, CKD stage III, solitary kidney, AAA, peripheral arterial disease presented to the hospital with left leg wound which was worsening in nature with decreased energy. Patient was found to have a white count of 12, hemoglobin 5.2, occult blood positive. Patient received 2 unit of packed red blood cells for anemia.Marland Kitchen X-ray of the right ankle revealed soft tissue swelling with no signs of bone infection.GI was consulted, underwent endoscopy 12/07 which showed benign appearing esophageal stenosis, 2 cm hiatal hernia, normal stomach. No bleeding duodenal ulcers with no stigmata of bleeding. GI recommended oral Protonix, follow-up biopsy results, follow-up as an outpatient with GI. Patient also presented with sepsis secondary to left lower extremity cellulitis. She was a started on IV Ancef and oral doxycycline. Wound care consulted and following. Redness left lower extremity has improved, but patient still benefit from IV antibiotics. PT is recommending skilled nursing facility. Social worker consulted for placement.   Assessment & Plan:   Principal Problem:   Sepsis due to cellulitis [facility]) Active Problems:   Atherosclerotic PVD with intermittent claudication - near occlusive SFA disease with moderate iliac disease   Essential hypertension   Chronic kidney disease (CKD), stage III (moderate) (HCC)   Protein-calorie malnutrition (HCC)   Symptomatic anemia   GI bleed   Protein-calorie malnutrition, severe   Sepsis secondary to left leg cellulitis: Continue Ancef and doxycycline. Following wound care recs Trending down WBC-10.6. Blood cultures negative x 5 days. Continue wound care.Could transition to Keflex and doxycycline on discharge to  complete 7 to 10-day course.  Acute symptomatic anemia. Status post2 units of packed RBC. Upper GI endoscopy showed evidence of nonbleeding duodenal ulcer. Hemoglobin stable at 8.3. Continue Protonix. Follow biopsy.   Mild dyspnea,shortness of breath improved today. Positive fluid balance..Had received blood transfusion and IV fluids.. Received one dose of lasix Friday afer fluoid/PRBC. Continue supplemental oxygen.  Duodenal ulcer. Status post EGD. Continue Protonix. Follow biopsy/BI recs  Essential HTN;  Continue with hydralazine. Monitor blood pressure closely.  CKD stage IIIb Cr range 1.1--1.3. continue to monitor. Creatinine of 1.3812/11  Severe protein calorie malnutrition;present on admission,dietitian on board. Continue supplements.   Peripheral vascular disease.Continue to hold aspirin due to symptomatic anemia  Acute metabolic encephalopathy: Continue treatment for infection.More alert awake and communicative today but appears mildly dyspneic  Deep tissue injury of the right ischial tuberosity., Left lateral leg and medial leg wounds with cellulitis. Present on admission. Continue wound care.Continue antibiotic.  Pressure Injury 07/06/20 Ischial tuberosity Right Deep Tissue Pressure Injury - Purple or maroon localized area of discolored intact skin or blood-filled blister due to damage of underlying soft tissue from pressure and/or shear. (Active)  07/06/20 2350  Location: Ischial tuberosity  Location Orientation: Right  Staging: Deep Tissue Pressure Injury - Purple or maroon localized area of discolored intact skin or blood-filled blister due to damage of underlying soft tissue from pressure and/or shear.  Wound Description (Comments):   Present on Admission: Yes       DVT prophylaxis: SCD/Compression stockings  Code Status: DNR    Code Status Orders  (From admission, onward)         Start     Ordered   07/06/20 1554   Do not attempt resuscitation (DNR)  Continuous  Question Answer Comment  In the event of cardiac or respiratory ARREST Do not call a "code blue"   In the event of cardiac or respiratory ARREST Do not perform Intubation, CPR, defibrillation or ACLS   In the event of cardiac or respiratory ARREST Use medication by any route, position, wound care, and other measures to relive pain and suffering. May use oxygen, suction and manual treatment of airway obstruction as needed for comfort.      07/06/20 1557        Code Status History    Date Active Date Inactive Code Status Order ID Comments User Context   07/06/2020 1518 07/06/2020 1557 DNR [id number]  Norval Morton, MD ED   08/17/2017 1848 08/21/2017 2329 Full Code [id number]  Roxan Hockey, MD Inpatient   12/09/2016 1846 12/12/2016 1717 Full Code [id number]  Eugenie Filler, MD Inpatient   12/01/2016 2042 12/03/2016 1645 Full Code [id number]  Gardiner Barefoot, NP Inpatient   12/01/2016 1660 12/01/2016 2042 DNR [id number]  Samella Parr, NP Inpatient   12/01/2016 1613 12/01/2016 1653 Full Code [id number]  Samella Parr, NP Inpatient   Advance Care Planning Activity    Advance Directive Documentation   Flowsheet Row Most Recent Value  Type of Advance Directive Out of facility DNR (pink MOST or yellow form)  Pre-existing out of facility DNR order (yellow form or pink MOST form) Yellow form placed in chart (order not valid for inpatient use)  "MOST" Form in Place? --     Family Communication: Discussed with family on Saturday, no changes, anticipating discharge to skilled nursing facility Disposition Plan:   Patient remained inpatient continue treat with IV antibiotics, continue wound care.  When medically stable anticipate discharge to skilled nursing facility Consults called: None Admission status: Inpatient   Consultants:   None  Procedures:  DG Ankle Complete Left  Result Date: 07/06/2020 CLINICAL DATA:  Soft tissue infection  EXAM: LEFT ANKLE COMPLETE - 3+ VIEW COMPARISON:  None. FINDINGS: Soft tissue swelling at the ankle. No erosive changes. No periosteal reaction. Degenerative changes at the posterior talocalcaneal articulation. Joint spaces are otherwise preserved. IMPRESSION: Soft tissue swelling at the ankle. No radiographic evidence of osteomyelitis. Electronically Signed   By: Macy Mis M.D.   On: 07/06/2020 13:57   DG Chest Port 1 View  Result Date: 07/10/2020 CLINICAL DATA:  Encounter for dyspnea EXAM: PORTABLE CHEST 1 VIEW COMPARISON:  12/09/2019 and prior FINDINGS: No pneumothorax. Trace bilateral effusions, new. Patchy bibasilar opacities. Stable cardiomegaly and aortic atherosclerotic calcifications. Multilevel spondylosis and osteopenia. IMPRESSION: Bibasilar opacities and trace effusions. Differential includes edema versus infection. Stable cardiomegaly. Electronically Signed   By: Primitivo Gauze M.D.   On: 07/10/2020 11:31     Antimicrobials:   See MAR   Subjective: No acute events overnight Resting comfortably in bed   Objective: Vitals:   07/11/20 1000 07/11/20 2056 07/12/20 0435 07/12/20 1000  BP: (!) 154/88 (!) 154/87 (!) 161/85 (!) 147/74  Pulse: (!) 104 82 91 98  Resp: 20 15 18 16   Temp: 98.9 F (37.2 C) 98.4 F (36.9 C) (!) 97.5 F (36.4 C) 98.2 F (36.8 C)  TempSrc: Oral  Oral Oral  SpO2: 99% 100% 99% 98%  Weight:  41.8 kg      Intake/Output Summary (Last 24 hours) at 07/12/2020 1123 Last data filed at 07/12/2020 0433 Gross per 24 hour  Intake 437 ml  Output 500 ml  Net -63 ml   [employer]  07/09/20 2205 07/11/20 0522 07/11/20 2056  Weight: 43.3 kg 41.8 kg 41.8 kg    Examination:  General exam: Appears calm and comfortable  Respiratory system: Clear to auscultation. Respiratory effort normal. Cardiovascular system: S1 & S2 heard, RRR. No JVD, murmurs, rubs, gallops or clicks. No pedal edema. Gastrointestinal system: Abdomen is nondistended, soft  and nontender. No organomegaly or masses felt. Normal bowel sounds heard. Central nervous system: Alert and oriented. No focal neurological deficits. Extremities: Warm well perfused, no acute changes. Skin: No new rashes, lesions or ulcers, chronic cellulitis as known Psychiatry: Judgement and insight appear normal. Mood & affect appropriate.     Data Reviewed: I have personally reviewed following labs and imaging studies  CBC: Recent Labs  Lab 07/06/20 1026 07/07/20 0024 07/08/20 0244 07/09/20 0033 07/10/20 0319 07/11/20 0452  WBC 12.2* 9.1 8.8 10.8* 10.8* 10.6*  NEUTROABS 10.6*  --   --   --   --   --   HGB 5.2* 7.7* 7.7* 8.0* 8.4* 8.3*  HCT 18.0* 24.6* 26.2* 27.2* 28.0* 27.5*  MCV 99.4 92.5 95.6 96.8 95.6 94.8  PLT 433* 324 332 313 340 542   Basic Metabolic Panel: Recent Labs  Lab 07/07/20 0024 07/08/20 0244 07/09/20 0033 07/10/20 0319 07/11/20 0452  NA 138 139 138 139 140  K 4.4 5.0 4.8 4.5 4.1  CL 104 104 106 103 102  CO2 25 27 23 25 29   GLUCOSE 163* 87 109* 141* 102*  BUN 48* 49* 48* 48* 53*  CREATININE 1.47* 1.43* 1.42* 1.39* 1.38*  CALCIUM 8.6* 8.7* 8.7* 8.9 9.0  MG  --   --   --  1.8 1.8  PHOS  --   --   --  3.4 3.4   GFR: Estimated Creatinine Clearance: 16.8 mL/min (A) (by C-G formula based on SCr of 1.38 mg/dL (H)). Liver Function Tests: Recent Labs  Lab 07/06/20 1026 07/10/20 0319 07/11/20 0452  AST 32 21 21  ALT 27 7 6   ALKPHOS 80 86 77  BILITOT 0.6 0.7 0.5  PROT 6.0* 5.7* 5.5*  ALBUMIN 3.1* 2.5* 2.6*   No results for input(s): LIPASE, AMYLASE in the last 168 hours. No results for input(s): AMMONIA in the last 168 hours. Coagulation Profile: No results for input(s): INR, PROTIME in the last 168 hours. Cardiac Enzymes: No results for input(s): CKTOTAL, CKMB, CKMBINDEX, TROPONINI in the last 168 hours. BNP (last 3 results) No results for input(s): PROBNP in the last 8760 hours. HbA1C: No results for input(s): HGBA1C in the last 72  hours. CBG: Recent Labs  Lab 07/07/20 1729 07/09/20 0836 07/11/20 2057  GLUCAP 80 82 133*   Lipid Profile: No results for input(s): CHOL, HDL, LDLCALC, TRIG, CHOLHDL, LDLDIRECT in the last 72 hours. Thyroid Function Tests: No results for input(s): TSH, T4TOTAL, FREET4, T3FREE, THYROIDAB in the last 72 hours. Anemia Panel: No results for input(s): VITAMINB12, FOLATE, FERRITIN, TIBC, IRON, RETICCTPCT in the last 72 hours. Sepsis Labs: Recent Labs  Lab 07/06/20 1026  LATICACIDVEN 1.1    Recent Results (from the past 240 hour(s))  Blood culture (routine x 2)     Status: None   Collection Time: 07/06/20  1:01 PM   Specimen: BLOOD LEFT ARM  Result Value Ref Range Status   Specimen Description BLOOD LEFT ARM  Final   Special Requests   Final    BOTTLES DRAWN AEROBIC AND ANAEROBIC Blood Culture adequate volume   Culture   Final    NO GROWTH 5  DAYS Performed at [facility], [place] [address], [place], [place] [address]    Report Status 07/11/2020 FINAL  Final  Blood culture (routine x 2)     Status: None   Collection Time: 07/06/20  1:01 PM   Specimen: BLOOD RIGHT ARM  Result Value Ref Range Status   Specimen Description BLOOD RIGHT ARM  Final   Special Requests   Final    BOTTLES DRAWN AEROBIC AND ANAEROBIC Blood Culture results may not be optimal due to an inadequate volume of blood received in culture bottles   Culture   Final    NO GROWTH 5 DAYS Performed at [facility], [place] [address] [place], [place] [address]    Report Status 07/11/2020 FINAL  Final  Resp Panel by RT-PCR (Flu A&B, Covid) Nasopharyngeal Swab     Status: None   Collection Time: 07/06/20  4:43 PM   Specimen: Nasopharyngeal Swab; Nasopharyngeal(NP) swabs in vial transport medium  Result Value Ref Range Status   SARS Coronavirus 2 by RT PCR NEGATIVE NEGATIVE Final    Comment: (NOTE) SARS-CoV-2 target nucleic acids are NOT DETECTED.  The SARS-CoV-2 RNA is generally detectable in upper  respiratory specimens during the acute phase of infection. The lowest concentration of SARS-CoV-2 viral copies this assay can detect is 138 copies/mL. A negative result does not preclude SARS-Cov-2 infection and should not be used as the sole basis for treatment or other patient management decisions. A negative result may occur with  improper specimen collection/handling, submission of specimen other than nasopharyngeal swab, presence of viral mutation(s) within the areas targeted by this assay, and inadequate number of viral copies(<138 copies/mL). A negative result must be combined with clinical observations, patient history, and epidemiological information. The expected result is Negative.  Fact Sheet for Patients:  EntrepreneurPulse.com.au  Fact Sheet for Healthcare Providers:  IncredibleEmployment.be  This test is no t yet approved or cleared by the [place] FDA and  has been authorized for detection and/or diagnosis of SARS-CoV-2 by FDA under an Emergency Use Authorization (EUA). This EUA will remain  in effect (meaning this test can be used) for the duration of the COVID-19 declaration under Section 564(b)(1) of the Act, 21 U.S.C.section 360bbb-3(b)(1), unless the authorization is terminated  or revoked sooner.       Influenza A by PCR NEGATIVE NEGATIVE Final   Influenza B by PCR NEGATIVE NEGATIVE Final    Comment: (NOTE) The Xpert Xpress SARS-CoV-2/FLU/RSV plus assay is intended as an aid in the diagnosis of influenza from Nasopharyngeal swab specimens and should not be used as a sole basis for treatment. Nasal washings and aspirates are unacceptable for Xpert Xpress SARS-CoV-2/FLU/RSV testing.  Fact Sheet for Patients: EntrepreneurPulse.com.au  Fact Sheet for Healthcare Providers: IncredibleEmployment.be  This test is not yet approved or cleared by the [place] FDA and has been  authorized for detection and/or diagnosis of SARS-CoV-2 by FDA under an Emergency Use Authorization (EUA). This EUA will remain in effect (meaning this test can be used) for the duration of the COVID-19 declaration under Section 564(b)(1) of the Act, 21 U.S.C. section 360bbb-3(b)(1), unless the authorization is terminated or revoked.  Performed at [facility], [place] [address] [place], [place] [address]          Radiology Studies: DG Chest Port 1 View  Result Date: 07/10/2020 CLINICAL DATA:  Encounter for dyspnea EXAM: PORTABLE CHEST 1 VIEW COMPARISON:  12/09/2019 and prior FINDINGS: No pneumothorax. Trace bilateral effusions,  new. Patchy bibasilar opacities. Stable cardiomegaly and aortic atherosclerotic calcifications. Multilevel spondylosis and osteopenia. IMPRESSION: Bibasilar opacities and trace effusions. Differential includes edema versus infection. Stable cardiomegaly. Electronically Signed   By: Primitivo Gauze M.D.   On: 07/10/2020 11:31        Scheduled Meds: . acidophilus  1 capsule Oral QPC lunch  . doxycycline  100 mg Oral Q12H  . feeding supplement  237 mL Oral TID BM  . hydrALAZINE  25 mg Oral BID  . multivitamin with minerals  1 tablet Oral Daily  . pantoprazole  40 mg Oral BID  . sodium chloride flush  3 mL Intravenous Q12H   Continuous Infusions: .  ceFAZolin (ANCEF) IV 1 g (07/12/20 0441)     LOS: 6 days    Time spent: [place]    Nicolette Bang, MD [facility]  If 7PM-7AM, please contact night-coverage  07/12/2020, 11:23 AM

## 2020-07-12 NOTE — Plan of Care (Signed)
  Problem: Health Behavior/Discharge Planning: Goal: Ability to manage health-related needs will improve Outcome: Progressing   Problem: Nutrition: Goal: Adequate nutrition will be maintained Outcome: Progressing   Problem: Coping: Goal: Level of anxiety will decrease Outcome: Progressing   Problem: Pain Managment: Goal: General experience of comfort will improve Outcome: Progressing   Problem: Skin Integrity: Goal: Skin integrity will improve Outcome: Progressing

## 2020-07-13 LAB — CBC WITH DIFFERENTIAL/PLATELET
Abs Immature Granulocytes: 0.06 10*3/uL (ref 0.00–0.07)
Basophils Absolute: 0 10*3/uL (ref 0.0–0.1)
Basophils Relative: 0 %
Eosinophils Absolute: 0.2 10*3/uL (ref 0.0–0.5)
Eosinophils Relative: 1 %
HCT: 29.5 % — ABNORMAL LOW (ref 36.0–46.0)
Hemoglobin: 9.1 g/dL — ABNORMAL LOW (ref 12.0–15.0)
Immature Granulocytes: 1 %
Lymphocytes Relative: 7 %
Lymphs Abs: 0.7 10*3/uL (ref 0.7–4.0)
MCH: 30 pg (ref 26.0–34.0)
MCHC: 30.8 g/dL (ref 30.0–36.0)
MCV: 97.4 fL (ref 80.0–100.0)
Monocytes Absolute: 0.9 10*3/uL (ref 0.1–1.0)
Monocytes Relative: 9 %
Neutro Abs: 8.5 10*3/uL — ABNORMAL HIGH (ref 1.7–7.7)
Neutrophils Relative %: 82 %
Platelets: 333 10*3/uL (ref 150–400)
RBC: 3.03 MIL/uL — ABNORMAL LOW (ref 3.87–5.11)
RDW: 15.8 % — ABNORMAL HIGH (ref 11.5–15.5)
WBC: 10.4 10*3/uL (ref 4.0–10.5)
nRBC: 0 % (ref 0.0–0.2)

## 2020-07-13 LAB — BASIC METABOLIC PANEL
Anion gap: 11 (ref 5–15)
BUN: 45 mg/dL — ABNORMAL HIGH (ref 8–23)
CO2: 29 mmol/L (ref 22–32)
Calcium: 9.4 mg/dL (ref 8.9–10.3)
Chloride: 100 mmol/L (ref 98–111)
Creatinine, Ser: 1.21 mg/dL — ABNORMAL HIGH (ref 0.44–1.00)
GFR, Estimated: 42 mL/min — ABNORMAL LOW (ref >60)
Glucose, Bld: 113 mg/dL — ABNORMAL HIGH (ref 70–99)
Potassium: 4.7 mmol/L (ref 3.5–5.1)
Sodium: 140 mmol/L (ref 135–145)

## 2020-07-13 LAB — RESP PANEL BY RT-PCR (FLU A&B, COVID) ARPGX2
Influenza A by PCR: NEGATIVE
Influenza B by PCR: NEGATIVE
SARS Coronavirus 2 by RT PCR: NEGATIVE

## 2020-07-13 LAB — GLUCOSE, CAPILLARY: Glucose-Capillary: 92 mg/dL (ref 70–99)

## 2020-07-13 MED ORDER — HYDRALAZINE HCL 25 MG PO TABS: 25.0000 mg | ORAL_TABLET | Freq: Three times a day (TID) | ORAL | Status: AC

## 2020-07-13 MED ORDER — PANTOPRAZOLE SODIUM 40 MG PO TBEC: DELAYED_RELEASE_TABLET | ORAL | 4 refills | Status: AC

## 2020-07-13 MED ORDER — CEPHALEXIN 250 MG PO CAPS: 250.0000 mg | ORAL_CAPSULE | Freq: Two times a day (BID) | ORAL | 0 refills | Status: AC

## 2020-07-13 MED ORDER — CEPHALEXIN 250 MG PO CAPS
250.0000 mg | ORAL_CAPSULE | Freq: Two times a day (BID) | ORAL | Status: DC
  Administered 2020-07-13 (×2): 250 mg via ORAL
  Filled 2020-07-13 (×2): qty 1

## 2020-07-13 MED ORDER — ONDANSETRON HCL 4 MG PO TABS: 4.0000 mg | ORAL_TABLET | Freq: Four times a day (QID) | ORAL | Status: DC | PRN

## 2020-07-13 MED ORDER — DOXYCYCLINE HYCLATE 100 MG PO TABS: 100.0000 mg | ORAL_TABLET | Freq: Two times a day (BID) | ORAL | 0 refills | Status: AC

## 2020-07-13 MED ORDER — COVID-19 MRNA VACCINE (PFIZER) 30 MCG/0.3ML IM SUSP
0.3000 mL | Freq: Once | INTRAMUSCULAR | Status: AC
  Administered 2020-07-13: 0.3 mL via INTRAMUSCULAR
  Filled 2020-07-13: qty 0.3

## 2020-07-13 NOTE — Consult Note (Signed)
   [facility] CM Inpatient Consult   07/13/2020  Kristina [name] 03/23/1926 [id number]   Patient chart reviewed for potential [facility] CM) services due to high unplanned readmission risk score, 22%.  Per review, current disposition plan is for SNF. No [facility] CM needs.  Netta Cedars, MSN, [facility] Liaison Nurse Mobile Phone [phone]  Toll free office [phone]

## 2020-07-13 NOTE — TOC Transition Note (Addendum)
Transition of Care [facility]) - CM/SW Discharge Note   Patient Details  Name: NARIYA NEUMEYER MRN: [id number] Date of Birth: 06-25-1926  Transition of Care [facility]) CM/SW Contact:  Bartholomew Crews, RN Phone Number: [phone] 07/13/2020, 2:55 PM   Clinical Narrative:     Patient ready to transition to [facility]. Spoke with Star in Admissions, patient will go to room 1205 in [facility]. Nurse to call report to [phone]. [facility] arranged for transport. Medical transport paperwork on chart. Voicemail left for daughter to update on transition. No further TOC needs identified.   Final next level of care: Skilled Nursing Facility Barriers to Discharge: No Barriers Identified   Patient Goals and CMS Choice Patient states their goals for this hospitalization and ongoing recovery are:: skilled rehab CMS Medicare.gov Compare Post Acute Care list provided to:: Patient Choice offered to / list presented to : [place]  Discharge Placement              Patient chooses bed at: [facility] Patient to be transferred to facility by: [facility] Name of family member notified: [name] Patient and family notified of of transfer: 07/13/20  Discharge Plan and Services In-house Referral: Clinical Social Work Discharge Planning Services: [employer] Consult Post Acute Care Choice: [facility]          DME Arranged: N/A DME Agency: NA       HH Arranged: NA HH Agency: NA        Social Determinants of Health (SDOH) Interventions     Readmission Risk Interventions No flowsheet data found.

## 2020-07-13 NOTE — Progress Notes (Signed)
Physical Therapy Treatment Patient Details Name: Kristina [name] MRN: [id number] DOB: [birth year] Today's Date: 07/13/2020    History of Present Illness 86 y.o. female with medical history significant of HTN, HLD, chronic anemia CKD stage III with solitary kidney, AAA, and PAD presents to ED on 12/6 for worsening wound of the left leg and decreased energy. Guaiac + stool, upper GI endoscopy 12/7 demonstrates benign esophageal stenosis, hiatial hernia, nonbleeding duodenal ulcers.    PT Comments    Patient received in bed, lethargic but able to be woken with verbal and tactile stimulation. Still very confused and perseverated on pain in BLEs as well as keeping the call bell in her lap and finding her nurse, but drifts off to sleep quite quickly when not directly stimulated. Needed totalAx2 to get to EOB, able to initiate moving legs towards the edge then becomes quite fearful of falling. Once at EOB needed complete truncal support posteriorly due to tendency to lean straight back. Only really able to tolerate sitting up at EOB for a minute or two at most. Returned to supine and left in bed positioned to comfort with all needs met, bed alarm active.    Follow Up Recommendations  SNF;Supervision/Assistance - 24 hour     Equipment Recommendations  None recommended by PT    Recommendations for Other Services       Precautions / Restrictions Precautions Precautions: Fall Precaution Comments: bilat LE wraps for cellulitis Restrictions Weight Bearing Restrictions: No    Mobility  Bed Mobility Overal bed mobility: Needs Assistance Bed Mobility: Supine to Sit;Sit to Supine     Supine to sit: Total assist;+2 for physical assistance Sit to supine: Total assist;+2 for physical assistance   General bed mobility comments: heavy totalAx2 for all mobility with very little initiation noted- but did participate in trying to bring her legs towards the edge of the bed! When sitting up immediately  tried to lay straight back and did not particiapte with efforts to bring her weight and head more anteriorly  Transfers                 General transfer comment: NT d/t safety   Ambulation/Gait             General Gait Details: NT   Stairs             Wheelchair Mobility    Modified Rankin (Stroke Patients Only)       Balance Overall balance assessment: Needs assistance Sitting-balance support: Feet supported;Single extremity supported Sitting balance-Leahy Scale: Poor Sitting balance - Comments: reliant on posterior truncal assist to maintain upright sitting Postural control: Posterior lean     Standing balance comment: NT                            Cognition Arousal/Alertness: Lethargic Behavior During Therapy: Flat affect Overall Cognitive Status: No family/caregiver present to determine baseline cognitive functioning Area of Impairment: Orientation;Attention;Following commands;Awareness;Problem solving                 Orientation Level: Disoriented to;Place;Time;Situation Current Attention Level: Focused   Following Commands: Follows one step commands inconsistently Safety/Judgement: Decreased awareness of safety;Decreased awareness of deficits Awareness: Intellectual Problem Solving: Slow processing;Difficulty sequencing;Decreased initiation;Requires verbal cues;Requires tactile cues General Comments: perseverates on getting her nurse but will not tell me what she needs help with other than "finding the nurse, where is she?". Also perseverates on pain in BLEs, and very  fearful of falling. Drifts off to sleep quickly if not stimulated.      Exercises      General Comments        Pertinent Vitals/Pain Pain Assessment: Faces Faces Pain Scale: Hurts even more Pain Location: LEs, especially during mobility, "all over"  Pain Descriptors / Indicators: Discomfort;Grimacing;Guarding Pain Intervention(s): Limited activity  within patient's tolerance;Monitored during session;Repositioned    Home Living                      Prior Function            PT Goals (current goals can now be found in the care plan section) Acute Rehab PT Goals Patient Stated Goal: none stated PT Goal Formulation: Patient unable to participate in goal setting Time For Goal Achievement: 07/22/20 Potential to Achieve Goals: Fair Progress towards PT goals: Progressing toward goals (veryyyy slowly)    Frequency    Min 2X/week      PT Plan Current plan remains appropriate    Co-evaluation              AM-PAC PT "6 Clicks" Mobility   Outcome Measure  Help needed turning from your back to your side while in a flat bed without using bedrails?: Total Help needed moving from lying on your back to sitting on the side of a flat bed without using bedrails?: Total Help needed moving to and from a bed to a chair (including a wheelchair)?: Total Help needed standing up from a chair using your arms (e.g., wheelchair or bedside chair)?: Total Help needed to walk in hospital room?: Total Help needed climbing 3-5 steps with a railing? : Total 6 Click Score: 6    End of Session   Activity Tolerance: Patient limited by fatigue;Patient limited by pain Patient left: in bed;with call bell/phone within reach;with bed alarm set Nurse Communication: Mobility status PT Visit Diagnosis: Other abnormalities of gait and mobility (R26.89)     Time: 1435-1446 PT Time Calculation (min) (ACUTE ONLY): 11 min  Charges:  $Therapeutic Activity: 8-22 mins                     Windell Norfolk, DPT, PN1   Supplemental Physical Therapist [place]    Pager [phone] Acute Rehab Office [phone]

## 2020-07-13 NOTE — Progress Notes (Signed)
Report called and given to Glen, at [facility].   Ceejay Kegley,RN.

## 2020-07-13 NOTE — Progress Notes (Signed)
RT called to PT room for breathing treatment.  Pt refuses treatment.  I asked several times

## 2020-07-13 NOTE — Plan of Care (Signed)
?  Problem: Coping: ?Goal: Level of anxiety will decrease ?Outcome: Progressing ?  ?Problem: Safety: ?Goal: Ability to remain free from injury will improve ?Outcome: Progressing ?  ?

## 2020-07-13 NOTE — Discharge Summary (Signed)
Physician Discharge Summary  DAISEY CALOCA UKG:[id number] DOB: 1925/10/23 DOA: 07/06/2020  PCP: Holland Commons, FNP  Admit date: 07/06/2020 Discharge date: 07/13/2020  Admitted From: Home  Discharge disposition: SNF   Recommendations for Outpatient Follow-Up:   . Follow up with your primary care provider at the SNF in 3-5 days . Check CBC, BMP, magnesium in the next visit . Patient will need to continue Protonix and follow-up with GI as outpatient in 3 to 4 weeks. . Aspirin has been resumed at this time.  Do not administer Lovenox. . Oxygen as needed. . Continue wound care as instructed   Discharge Diagnosis:   Principal Problem:   Sepsis due to cellulitis [facility]) Active Problems:   Atherosclerotic PVD with intermittent claudication - near occlusive SFA disease with moderate iliac disease   Essential hypertension   Chronic kidney disease (CKD), stage III (moderate) (HCC)   Protein-calorie malnutrition (HCC)   Symptomatic anemia   GI bleed   Protein-calorie malnutrition, severe   Discharge Condition: Improved.  Diet recommendation: .  Regular.  Wound care: See instructions below  Code status: DNR   History of Present Illness:  86 years old female with past medical history of hypertension, hyperlipidemia, chronic anemia, CKD stage III, solitary kidney, AAA, peripheral arterial disease presented to the hospital with left leg wound which was worsening in nature with decreased energy.  Patient was found to have a white count of 12, hemoglobin 5.2, occult blood positive. Patient received 2 unit of packed red blood cells for anemia.Marland Kitchen  X-ray of the right ankle revealed soft tissue swelling with no signs of bone infection.GI was consulted, underwent endoscopy 12/07 which showed benign appearing esophageal stenosis, 2 cm hiatal hernia, normal stomach.  No bleeding duodenal ulcers with no stigmata of bleeding.  GI recommended oral Protonix, follow-up biopsy results, follow-up  as an outpatient with GI. Patient also presented with sepsis secondary to left lower extremity cellulitis.  She was a started on IV Ancef and oral doxycycline.  Wound care consulted and following.  Redness left lower extremity has improved, on IV antibiotic.  PT is recommending  skilled nursing facility.  Social worker consulted for placement.  Pending placement at this time   Hospital Course:   Following conditions were addressed during hospitalization as listed below,  Sepsis secondary to left leg cellulitis: on Ancef and doxycycline.  Can change to oral Keflex and doxycycline for 3 more days to complete 10-day course on discharge.  WBC has normalized.  Blood cultures negative.   Acute symptomatic anemia. Status post 2 units of packed RBC.  Upper GI endoscopy showed evidence of nonbleeding duodenal ulcer.  Hemoglobin stable  Continue Protonix.    Biopsy without H. pylori but showed chronic gastritis..  Will have GI follow-up after discharge  Duodenal ulcer.  Status post EGD.  Continue Protonix.    Biopsy without H. pylori.  Essential HTN;  Continue with hydralazine.  Monitor blood pressure closely.  Patient takes hydralazine 50 mg twice a day at home 25 mg 3 times daily.  CKD stage IIIb Baseline Cr range 1.1--1.3.  Currently at 1.2.  At baseline.  Severe protein calorie malnutrition; present on admission, continue nutritional supplement  Peripheral vascular disease.  Continue to hold aspirin due to symptomatic anemia.  Will need to resume on discharge  Acute metabolic encephalopathy:  Improved at this time.  Likely secondary to infection  Deep tissue injury of the right ischial tuberosity.,  Left lateral leg and medial leg wounds  with cellulitis. Present on admission.  Continue wound care.  Currently on antibiotic. Pressure Injury 07/06/20 Ischial tuberosity Right Deep Tissue Pressure Injury - Purple or maroon localized area of discolored intact skin or blood-filled blister due  to damage of underlying soft tissue from pressure and/or shear. (Active)  07/06/20 2350  Location: Ischial tuberosity  Location Orientation: Right  Staging: Deep Tissue Pressure Injury - Purple or maroon localized area of discolored intact skin or blood-filled blister due to damage of underlying soft tissue from pressure and/or shear.  Wound Description (Comments):   Present on Admission: Yes    Disposition.  At this time, patient is stable for disposition to skilled nursing facility.  I tried to reach the patient's daughter Ms. [name] on the cell phone and home phone listed but was unable to reach her.  Medical Consultants:    GI  Procedures:     Upper GI endoscopy with biopsy 07/07/20  Subjective:   Today, patient was seen and examined at bedside.  Patient denies any nausea vomiting abdominal pain.  Poor historian.   Discharge Exam:   Vitals:   07/13/20 0453 07/13/20 0938  BP: 132/62 138/64  Pulse: 79 (!) 107  Resp: 15 19  Temp: 98.4 F (36.9 C) 98.2 F (36.8 C)  SpO2: 99% 100%   Vitals:   07/12/20 1752 07/12/20 2051 07/13/20 0453 07/13/20 0938  BP: (!) 142/61 (!) 149/74 132/62 138/64  Pulse: 86 98 79 (!) 107  Resp: 18 15 15 19   Temp: 99.4 F (37.4 C) 98.4 F (36.9 C) 98.4 F (36.9 C) 98.2 F (36.8 C)  TempSrc: Oral     SpO2: 100% 99% 99% 100%  Weight:  41.8 kg      General: Thinly built, not in obvious distress, at baseline mentation alert awake and communicative.  Nasal cannula oxygen HENT:   No scleral pallor or icterus noted. Oral mucosa is moist.  Chest:    Diminished breath sounds bilaterally. No crackles or wheezes.  CVS: S1 &S2 heard. No murmur.  Regular rate and rhythm. Abdomen: Soft, nontender, nondistended.  Bowel sounds are heard.   Extremities: No cyanosis, clubbing bilateral lower extremity with elastic compression bandage  Psych: Alert, awake and communicative, answering few questions. CNS:  No cranial nerve deficits.  Power equal in all  extremities.  Generalized weakness noted Skin: Warm and dry.   Deep tissue injury of the ischial tuberosity.  Left lateral leg with 3 x 1 x 0.2 cm wound and left medial leg with 12 x 2.5 x 0.2 cm wound presentation, currently on elastic compression bandage.  The results of significant diagnostics from this hospitalization (including imaging, microbiology, ancillary and laboratory) are listed below for reference.     Diagnostic Studies:   DG Ankle Complete Left  Result Date: 07/06/2020 CLINICAL DATA:  Soft tissue infection EXAM: LEFT ANKLE COMPLETE - 3+ VIEW COMPARISON:  None. FINDINGS: Soft tissue swelling at the ankle. No erosive changes. No periosteal reaction. Degenerative changes at the posterior talocalcaneal articulation. Joint spaces are otherwise preserved. IMPRESSION: Soft tissue swelling at the ankle. No radiographic evidence of osteomyelitis. Electronically Signed   By: Macy Mis M.D.   On: 07/06/2020 13:57     Labs:   Basic Metabolic Panel: Recent Labs  Lab 07/08/20 0244 07/09/20 0033 07/10/20 0319 07/11/20 0452 07/13/20 0303  NA 139 138 139 140 140  K 5.0 4.8 4.5 4.1 4.7  CL 104 106 103 102 100  CO2 27 23 25 29  29  GLUCOSE 87 109* 141* 102* 113*  BUN 49* 48* 48* 53* 45*  CREATININE 1.43* 1.42* 1.39* 1.38* 1.21*  CALCIUM 8.7* 8.7* 8.9 9.0 9.4  MG  --   --  1.8 1.8  --   PHOS  --   --  3.4 3.4  --    GFR Estimated Creatinine Clearance: 19.2 mL/min (A) (by C-G formula based on SCr of 1.21 mg/dL (H)). Liver Function Tests: Recent Labs  Lab 07/10/20 0319 07/11/20 0452  AST 21 21  ALT 7 6  ALKPHOS 86 77  BILITOT 0.7 0.5  PROT 5.7* 5.5*  ALBUMIN 2.5* 2.6*   No results for input(s): LIPASE, AMYLASE in the last 168 hours. No results for input(s): AMMONIA in the last 168 hours. Coagulation profile No results for input(s): INR, PROTIME in the last 168 hours.  CBC: Recent Labs  Lab 07/08/20 0244 07/09/20 0033 07/10/20 0319 07/11/20 0452 07/13/20 0303   WBC 8.8 10.8* 10.8* 10.6* 10.4  NEUTROABS  --   --   --   --  8.5*  HGB 7.7* 8.0* 8.4* 8.3* 9.1*  HCT 26.2* 27.2* 28.0* 27.5* 29.5*  MCV 95.6 96.8 95.6 94.8 97.4  PLT 332 313 340 328 333   Cardiac Enzymes: No results for input(s): CKTOTAL, CKMB, CKMBINDEX, TROPONINI in the last 168 hours. BNP: Invalid input(s): POCBNP CBG: Recent Labs  Lab 07/07/20 1729 07/09/20 0836 07/11/20 2057 07/12/20 2050 07/13/20 0650  GLUCAP 80 82 133* 139* 92   D-Dimer No results for input(s): DDIMER in the last 72 hours. Hgb A1c No results for input(s): HGBA1C in the last 72 hours. Lipid Profile No results for input(s): CHOL, HDL, LDLCALC, TRIG, CHOLHDL, LDLDIRECT in the last 72 hours. Thyroid function studies No results for input(s): TSH, T4TOTAL, T3FREE, THYROIDAB in the last 72 hours.  Invalid input(s): FREET3 Anemia work up No results for input(s): VITAMINB12, FOLATE, FERRITIN, TIBC, IRON, RETICCTPCT in the last 72 hours. Microbiology Recent Results (from the past 240 hour(s))  Blood culture (routine x 2)     Status: None   Collection Time: 07/06/20  1:01 PM   Specimen: BLOOD LEFT ARM  Result Value Ref Range Status   Specimen Description BLOOD LEFT ARM  Final   Special Requests   Final    BOTTLES DRAWN AEROBIC AND ANAEROBIC Blood Culture adequate volume   Culture   Final    NO GROWTH 5 DAYS Performed at [facility], [address], [place], [place] [address]    Report Status 07/11/2020 FINAL  Final  Blood culture (routine x 2)     Status: None   Collection Time: 07/06/20  1:01 PM   Specimen: BLOOD RIGHT ARM  Result Value Ref Range Status   Specimen Description BLOOD RIGHT ARM  Final   Special Requests   Final    BOTTLES DRAWN AEROBIC AND ANAEROBIC Blood Culture results may not be optimal due to an inadequate volume of blood received in culture bottles   Culture   Final    NO GROWTH 5 DAYS Performed at [facility], [place] [address], [place], [place] [address]     Report Status 07/11/2020 FINAL  Final  Resp Panel by RT-PCR (Flu A&B, Covid) Nasopharyngeal Swab     Status: None   Collection Time: 07/06/20  4:43 PM   Specimen: Nasopharyngeal Swab; Nasopharyngeal(NP) swabs in vial transport medium  Result Value Ref Range Status   SARS Coronavirus 2 by RT PCR NEGATIVE NEGATIVE Final  Comment: (NOTE) SARS-CoV-2 target nucleic acids are NOT DETECTED.  The SARS-CoV-2 RNA is generally detectable in upper respiratory specimens during the acute phase of infection. The lowest concentration of SARS-CoV-2 viral copies this assay can detect is 138 copies/mL. A negative result does not preclude SARS-Cov-2 infection and should not be used as the sole basis for treatment or other patient management decisions. A negative result may occur with  improper specimen collection/handling, submission of specimen other than nasopharyngeal swab, presence of viral mutation(s) within the areas targeted by this assay, and inadequate number of viral copies(<138 copies/mL). A negative result must be combined with clinical observations, patient history, and epidemiological information. The expected result is Negative.  Fact Sheet for Patients:  EntrepreneurPulse.com.au  Fact Sheet for Healthcare Providers:  IncredibleEmployment.be  This test is no t yet approved or cleared by the [place] FDA and  has been authorized for detection and/or diagnosis of SARS-CoV-2 by FDA under an Emergency Use Authorization (EUA). This EUA will remain  in effect (meaning this test can be used) for the duration of the COVID-19 declaration under Section 564(b)(1) of the Act, 21 U.S.C.section 360bbb-3(b)(1), unless the authorization is terminated  or revoked sooner.       Influenza A by PCR NEGATIVE NEGATIVE Final   Influenza B by PCR NEGATIVE NEGATIVE Final    Comment: (NOTE) The Xpert Xpress SARS-CoV-2/FLU/RSV plus assay is intended as an aid in  the diagnosis of influenza from Nasopharyngeal swab specimens and should not be used as a sole basis for treatment. Nasal washings and aspirates are unacceptable for Xpert Xpress SARS-CoV-2/FLU/RSV testing.  Fact Sheet for Patients: EntrepreneurPulse.com.au  Fact Sheet for Healthcare Providers: IncredibleEmployment.be  This test is not yet approved or cleared by the [place] FDA and has been authorized for detection and/or diagnosis of SARS-CoV-2 by FDA under an Emergency Use Authorization (EUA). This EUA will remain in effect (meaning this test can be used) for the duration of the COVID-19 declaration under Section 564(b)(1) of the Act, 21 U.S.C. section 360bbb-3(b)(1), unless the authorization is terminated or revoked.  Performed at [facility], [place] [address], [place], [place] [address]      Discharge Instructions:   Discharge Instructions    Call MD for:  persistant nausea and vomiting   Complete by: As directed    Call MD for:  severe uncontrolled pain   Complete by: As directed    Call MD for:  temperature >100.4   Complete by: As directed    Diet general   Complete by: As directed    Discharge instructions   Complete by: As directed    Follow up with your primary care physician at the SNF in 3-5 days. Complete the course of antibiotics. Follow up with GI in 3-4 weeks.   Discharge wound care:   Complete by: As directed    Cleanse both legs with NS and pat dry.  Apply Aquacel AG to wound bed.  Cover with dry gauze and wrap with kerlix and ace wrap from below toes to below knees.  Change twice weekly Tuesday and Friday.   Increase activity slowly   Complete by: As directed      Allergies as of 07/13/2020      Reactions   Lipitor [atorvastatin] Other (See Comments)   Reaction:  Unknown    Pletal [cilostazol] Other (See Comments)   Reaction:  Unknown    Simvastatin    MYALGIAS      Medication List  STOP  taking these medications   enoxaparin 40 MG/0.4ML injection Commonly known as: LOVENOX   HYDROcodone-acetaminophen 5-325 MG tablet Commonly known as: NORCO/VICODIN   HYDROcodone-acetaminophen 7.5-325 MG tablet Commonly known as: Norco     TAKE these medications   Align 4 MG Caps Take 4 mg by mouth daily after lunch.   aspirin 81 MG tablet Take 81 mg by mouth daily.   cephALEXin 250 MG capsule Commonly known as: KEFLEX Take 1 capsule (250 mg total) by mouth every 12 (twelve) hours for 6 doses.   COLACE PO Take 1 tablet by mouth daily.   CoQ10 100 MG Caps Take 100 mg by mouth 3 (three) times daily with meals.   doxycycline 100 MG tablet Commonly known as: VIBRA-TABS Take 1 tablet (100 mg total) by mouth every 12 (twelve) hours for 6 doses.   feeding supplement Liqd Take 237 mLs by mouth 2 (two) times daily between meals.   hydrALAZINE 25 MG tablet Commonly known as: APRESOLINE Take 1 tablet (25 mg total) by mouth 3 (three) times daily. What changed:   medication strength  how much to take  when to take this   ondansetron 4 MG tablet Commonly known as: ZOFRAN Take 1 tablet (4 mg total) by mouth every 6 (six) hours as needed for nausea or vomiting.   pantoprazole 40 MG tablet Commonly known as: PROTONIX Take 1 tablet (40 mg total) by mouth 2 (two) times daily for 30 days, THEN 1 tablet (40 mg total) daily. Start taking on: July 13, 2020 What changed: See the new instructions.   polyethylene glycol 17 g packet Commonly known as: MIRALAX / GLYCOLAX Take 17 g by mouth daily as needed for mild constipation.   Vitamin D (Ergocalciferol) 1.25 MG (50000 UNIT) Caps capsule Commonly known as: DRISDOL Take 50,000 Units by mouth every 7 (seven) days.            Discharge Care Instructions  (From admission, onward)         Start     Ordered   07/13/20 0000  Discharge wound care:       Comments: Cleanse both legs with NS and pat dry.  Apply Aquacel AG to  wound bed.  Cover with dry gauze and wrap with kerlix and ace wrap from below toes to below knees.  Change twice weekly Tuesday and Friday.   07/13/20 1345          Contact information for follow-up providers    [facility] Follow up.   Specialty: Home Health Services Contact information: [place] [address] [phone]        Juanita Craver, MD. Schedule an appointment as soon as possible for a visit in 4 week(s).   Specialty: Gastroenterology Why: follow up of ulcers,  Contact information: [address] [place] [address] [phone]            Contact information for after-discharge care    Destination    [facility] Preferred SNF .   Service: Skilled Nursing Contact information: [place] [phone]                   Time coordinating discharge: 39 minutes  Signed:  Leopold Smyers  [facility] 07/13/2020, 1:47 PM

## 2020-07-13 NOTE — Progress Notes (Signed)
PROGRESS NOTE    Kristina [name]  TIW:[id number] DOB: 1925-10-18 DOA: 07/06/2020 PCP: Holland Commons, FNP   Brief Narrative:  86 years old female with past medical history of hypertension, hyperlipidemia, chronic anemia, CKD stage III, solitary kidney, AAA, peripheral arterial disease presented to the hospital with left leg wound which was worsening in nature with decreased energy.  Patient was found to have a white count of 12, hemoglobin 5.2, occult blood positive. Patient received 2 unit of packed red blood cells for anemia.[place] Kitchen  X-ray of the right ankle revealed soft tissue swelling with no signs of bone infection.GI was consulted, underwent endoscopy 12/07 which showed benign appearing esophageal stenosis, 2 cm hiatal hernia, normal stomach.  No bleeding duodenal ulcers with no stigmata of bleeding.  GI recommended oral Protonix, follow-up biopsy results, follow-up as an outpatient with GI. Patient also presented with sepsis secondary to left lower extremity cellulitis.  She was a started on IV Ancef and oral doxycycline.  Wound care consulted and following.  Redness left lower extremity has improved, on IV antibiotic.  PT is recommending  skilled nursing facility.  Social worker consulted for placement.  Pending placement at this time.  Assessment & Plan:   Principal Problem:   Sepsis due to cellulitis [facility]) Active Problems:   Atherosclerotic PVD with intermittent claudication - near occlusive SFA disease with moderate iliac disease   Essential hypertension   Chronic kidney disease (CKD), stage III (moderate) (HCC)   Protein-calorie malnutrition (HCC)   Symptomatic anemia   GI bleed   Protein-calorie malnutrition, severe  Sepsis secondary to left leg cellulitis: on Ancef and doxycycline.  Can change to oral Keflex and doxycycline for 3 more days to complete 10-day course on discharge.  WBC has normalized.  Blood cultures negative.   CBC Latest Ref Rng & Units 07/13/2020 07/11/2020  07/10/2020  WBC 4.0 - 10.5 K/uL 10.4 10.6(H) 10.8(H)  Hemoglobin 12.0 - 15.0 g/dL 9.1(L) 8.3(L) 8.4(L)  Hematocrit 36.0 - 46.0 % 29.5(L) 27.5(L) 28.0(L)  Platelets 150 - 400 K/uL 333 328 340    Acute symptomatic anemia. Status post 2 units of packed RBC.  Upper GI endoscopy showed evidence of nonbleeding duodenal ulcer.  Hemoglobin stable  Continue Protonix.  Follow biopsy.   CBC Latest Ref Rng & Units 07/13/2020 07/11/2020 07/10/2020  WBC 4.0 - 10.5 K/uL 10.4 10.6(H) 10.8(H)  Hemoglobin 12.0 - 15.0 g/dL 9.1(L) 8.3(L) 8.4(L)  Hematocrit 36.0 - 46.0 % 29.5(L) 27.5(L) 28.0(L)  Platelets 150 - 400 K/uL 333 328 340    Duodenal ulcer.  Status post EGD.  Continue Protonix.  Follow biopsy  Essential HTN;  Continue with hydralazine.  Monitor blood pressure closely.  Patient takes hydralazine 50 mg twice a day at home 25 mg 3 times daily.  CKD stage IIIb Baseline Cr range 1.1--1.3.  Currently at 1.2.  At baseline.  Severe protein calorie malnutrition; present on admission, continue nutritional supplement  Peripheral vascular disease.  Continue to hold aspirin due to symptomatic anemia.  Will need to resume on discharge  Acute metabolic encephalopathy:  Improved at this time.  Likely secondary to infection  Deep tissue injury of the right ischial tuberosity.,  Left lateral leg and medial leg wounds with cellulitis. Present on admission.  Continue wound care.  Currently on antibiotic.  Pressure Injury 07/06/20 Ischial tuberosity Right Deep Tissue Pressure Injury - Purple or maroon localized area of discolored intact skin or blood-filled blister due to damage of underlying soft tissue from pressure and/or  shear. (Active)  07/06/20 2350  Location: Ischial tuberosity  Location Orientation: Right  Staging: Deep Tissue Pressure Injury - Purple or maroon localized area of discolored intact skin or blood-filled blister due to damage of underlying soft tissue from pressure and/or shear.  Wound  Description (Comments):   Present on Admission: Yes    DVT prophylaxis: SCD, Lovenox on hold  Code Status: DNR  Family Communication:  None today.  Disposition Plan:  Status is: Inpatient  Remains inpatient appropriate because:Ongoing diagnostic testing needed not appropriate for outpatient work up, IV antibiotics, need for skilled nursing facility placement  Dispo: The patient is from: Home              Anticipated d/c is to:  skilled nursing facility as per PT evaluation.               Anticipated d/c date is: 1 to 2 days              Patient currently is medically stable to d/c.  Consultants:   GI  Procedures:   Upper GI endoscopy with biopsy 07/07/20  Antimicrobials:  Ancef IV Doxycycline  Subjective: Today, patient was seen and examined at bedside.  Patient denies any nausea vomiting abdominal pain.  Poor historian.   Objective: Vitals:   07/12/20 1752 07/12/20 2051 07/13/20 0453 07/13/20 0938  BP: (!) 142/61 (!) 149/74 132/62 138/64  Pulse: 86 98 79 (!) 107  Resp: 18 15 15 19   Temp: 99.4 F (37.4 C) 98.4 F (36.9 C) 98.4 F (36.9 C) 98.2 F (36.8 C)  TempSrc: Oral     SpO2: 100% 99% 99% 100%  Weight:  41.8 kg      Intake/Output Summary (Last 24 hours) at 07/13/2020 1029 Last data filed at 07/13/2020 0900 Gross per 24 hour  Intake 346.06 ml  Output 225 ml  Net 121.06 ml   Filed Weights   07/11/20 0522 07/11/20 2056 07/12/20 2051  Weight: 41.8 kg 41.8 kg 41.8 kg   Body mass index is 19.26 kg/m.  Physical examination:  General: Thinly built, not in obvious distress, at baseline mentation alert awake and communicative.  Nasal cannula oxygen 4 L/min. HENT:   No scleral pallor or icterus noted. Oral mucosa is moist.  Chest:    Diminished breath sounds bilaterally. No crackles or wheezes.  CVS: S1 &S2 heard. No murmur.  Regular rate and rhythm. Abdomen: Soft, nontender, nondistended.  Bowel sounds are heard.   Extremities: No cyanosis, clubbing  bilateral lower extremity with elastic compression bandage  Psych: Alert, awake and communicative, answering few questions. CNS:  No cranial nerve deficits.  Power equal in all extremities.  Generalized weakness noted Skin: Warm and dry.   Deep tissue injury of the ischial tuberosity.  Left lateral leg with 3 x 1 x 0.2 cm wound and left medial leg with 12 x 2.5 x 0.2 cm wound presentation, currently on elastic compression bandage..   Data Reviewed: I have personally reviewed following labs and imaging studies  CBC: Recent Labs  Lab 07/08/20 0244 07/09/20 0033 07/10/20 0319 07/11/20 0452 07/13/20 0303  WBC 8.8 10.8* 10.8* 10.6* 10.4  NEUTROABS  --   --   --   --  8.5*  HGB 7.7* 8.0* 8.4* 8.3* 9.1*  HCT 26.2* 27.2* 28.0* 27.5* 29.5*  MCV 95.6 96.8 95.6 94.8 97.4  PLT 332 313 340 328 735   Basic Metabolic Panel: Recent Labs  Lab 07/08/20 0244 07/09/20 0033 07/10/20 0319 07/11/20 3299  07/13/20 0303  NA 139 138 139 140 140  K 5.0 4.8 4.5 4.1 4.7  CL 104 106 103 102 100  CO2 27 23 25 29 29   GLUCOSE 87 109* 141* 102* 113*  BUN 49* 48* 48* 53* 45*  CREATININE 1.43* 1.42* 1.39* 1.38* 1.21*  CALCIUM 8.7* 8.7* 8.9 9.0 9.4  MG  --   --  1.8 1.8  --   PHOS  --   --  3.4 3.4  --    GFR: Estimated Creatinine Clearance: 19.2 mL/min (A) (by C-G formula based on SCr of 1.21 mg/dL (H)). Liver Function Tests: Recent Labs  Lab 07/10/20 0319 07/11/20 0452  AST 21 21  ALT 7 6  ALKPHOS 86 77  BILITOT 0.7 0.5  PROT 5.7* 5.5*  ALBUMIN 2.5* 2.6*   No results for input(s): LIPASE, AMYLASE in the last 168 hours. No results for input(s): AMMONIA in the last 168 hours. Coagulation Profile: No results for input(s): INR, PROTIME in the last 168 hours. Cardiac Enzymes: No results for input(s): CKTOTAL, CKMB, CKMBINDEX, TROPONINI in the last 168 hours. BNP (last 3 results) No results for input(s): PROBNP in the last 8760 hours. HbA1C: No results for input(s): HGBA1C in the last 72  hours. CBG: Recent Labs  Lab 07/07/20 1729 07/09/20 0836 07/11/20 2057 07/12/20 2050 07/13/20 0650  GLUCAP 80 82 133* 139* 92   Lipid Profile: No results for input(s): CHOL, HDL, LDLCALC, TRIG, CHOLHDL, LDLDIRECT in the last 72 hours. Thyroid Function Tests: No results for input(s): TSH, T4TOTAL, FREET4, T3FREE, THYROIDAB in the last 72 hours. Anemia Panel: No results for input(s): VITAMINB12, FOLATE, FERRITIN, TIBC, IRON, RETICCTPCT in the last 72 hours. Sepsis Labs: No results for input(s): PROCALCITON, LATICACIDVEN in the last 168 hours.  Recent Results (from the past 240 hour(s))  Blood culture (routine x 2)     Status: None   Collection Time: 07/06/20  1:01 PM   Specimen: BLOOD LEFT ARM  Result Value Ref Range Status   Specimen Description BLOOD LEFT ARM  Final   Special Requests   Final    BOTTLES DRAWN AEROBIC AND ANAEROBIC Blood Culture adequate volume   Culture   Final    NO GROWTH 5 DAYS Performed at [facility], [address], [place], [place] [address]    Report Status 07/11/2020 FINAL  Final  Blood culture (routine x 2)     Status: None   Collection Time: 07/06/20  1:01 PM   Specimen: BLOOD RIGHT ARM  Result Value Ref Range Status   Specimen Description BLOOD RIGHT ARM  Final   Special Requests   Final    BOTTLES DRAWN AEROBIC AND ANAEROBIC Blood Culture results may not be optimal due to an inadequate volume of blood received in culture bottles   Culture   Final    NO GROWTH 5 DAYS Performed at [facility], [place] [address] [place], [place] [address]    Report Status 07/11/2020 FINAL  Final  Resp Panel by RT-PCR (Flu A&B, Covid) Nasopharyngeal Swab     Status: None   Collection Time: 07/06/20  4:43 PM   Specimen: Nasopharyngeal Swab; Nasopharyngeal(NP) swabs in vial transport medium  Result Value Ref Range Status   SARS Coronavirus 2 by RT PCR NEGATIVE NEGATIVE Final    Comment: (NOTE) SARS-CoV-2 target nucleic acids are NOT  DETECTED.  The SARS-CoV-2 RNA is generally detectable in upper respiratory specimens during the acute phase of infection. The lowest concentration  of SARS-CoV-2 viral copies this assay can detect is 138 copies/mL. A negative result does not preclude SARS-Cov-2 infection and should not be used as the sole basis for treatment or other patient management decisions. A negative result may occur with  improper specimen collection/handling, submission of specimen other than nasopharyngeal swab, presence of viral mutation(s) within the areas targeted by this assay, and inadequate number of viral copies(<138 copies/mL). A negative result must be combined with clinical observations, patient history, and epidemiological information. The expected result is Negative.  Fact Sheet for Patients:  EntrepreneurPulse.com.au  Fact Sheet for Healthcare Providers:  IncredibleEmployment.be  This test is no t yet approved or cleared by the [place] FDA and  has been authorized for detection and/or diagnosis of SARS-CoV-2 by FDA under an Emergency Use Authorization (EUA). This EUA will remain  in effect (meaning this test can be used) for the duration of the COVID-19 declaration under Section 564(b)(1) of the Act, 21 U.S.C.section 360bbb-3(b)(1), unless the authorization is terminated  or revoked sooner.       Influenza A by PCR NEGATIVE NEGATIVE Final   Influenza B by PCR NEGATIVE NEGATIVE Final    Comment: (NOTE) The Xpert Xpress SARS-CoV-2/FLU/RSV plus assay is intended as an aid in the diagnosis of influenza from Nasopharyngeal swab specimens and should not be used as a sole basis for treatment. Nasal washings and aspirates are unacceptable for Xpert Xpress SARS-CoV-2/FLU/RSV testing.  Fact Sheet for Patients: EntrepreneurPulse.com.au  Fact Sheet for Healthcare Providers: IncredibleEmployment.be  This test is not yet  approved or cleared by the [place] FDA and has been authorized for detection and/or diagnosis of SARS-CoV-2 by FDA under an Emergency Use Authorization (EUA). This EUA will remain in effect (meaning this test can be used) for the duration of the COVID-19 declaration under Section 564(b)(1) of the Act, 21 U.S.C. section 360bbb-3(b)(1), unless the authorization is terminated or revoked.  Performed at [facility], [place] [address], [place], [place] [address]      Radiology Studies: No results found.    Scheduled Meds: . acidophilus  1 capsule Oral QPC lunch  . doxycycline  100 mg Oral Q12H  . feeding supplement  237 mL Oral TID BM  . hydrALAZINE  25 mg Oral BID  . multivitamin with minerals  1 tablet Oral Daily  . pantoprazole  40 mg Oral BID  . sodium chloride flush  3 mL Intravenous Q12H   Continuous Infusions: .  ceFAZolin (ANCEF) IV 1 g (07/13/20 0647)     LOS: 7 days    Flora Lipps, MD [facility] 07/13/2020

## 2020-07-13 NOTE — TOC Progression Note (Signed)
Transition of Care [facility]) - Progression Note    Patient Details  Name: Kristina [name] MRN: [id number] Date of Birth: 12-Aug-1925  Transition of Care [facility]) CM/SW Contact  Bartholomew Crews, RN Phone Number: [phone] 07/13/2020, 1:42 PM  Clinical Narrative:     Spoke with patient's daughter, [name], on her mobile. [name] has chosen [employer]. Patient is not vaccinated, but [name] would like her mom to receive [employer] vaccine. Spoke with patient who is agreeable. MD notified of request and is agreeable. Discussed with [name] policy at SNF x 14 days. TOC following for transition needs.   Expected Discharge Plan: [place] Barriers to Discharge: Continued Medical Work up  Expected Discharge Plan and Services Expected Discharge Plan: [place] In-house Referral: Clinical Social Work Discharge Planning Services: CM Consult Post Acute Care Choice: [place] arrangements for the past 2 months: Single Family Home                 DME Arranged: N/A DME Agency: NA       HH Arranged: NA HH Agency: NA         Social Determinants of Health (SDOH) Interventions    Readmission Risk Interventions No flowsheet data found.

## 2020-07-13 NOTE — Progress Notes (Signed)
Called 3x to give report, Network engineer took my name and number for them to call me back  Ezreal Turay, RN

## 2020-07-13 NOTE — Progress Notes (Signed)
Vickki Hearing to be discharged to [facility] per MD order. Patient verbalized understanding.  Skin clean, dry and intact without evidence of skin break down, no evidence of skin tears noted. IV catheter discontinued intact. Site without signs and symptoms of complications. Dressing and pressure applied. Pt denies pain at the site currently. No complaints noted.  Patient free of lines, drains, and wounds.   Discharge packet assembled. An After Visit Summary (AVS) was printed and given to the EMS personnel. Patient escorted via stretcher and discharged to [facility] via ambulance. Report called to accepting facility; all questions and concerns addressed.   Suzy Bouchard, RN

## 2020-07-13 NOTE — Care Management Important Message (Signed)
Important Message  Patient Details  Name: Kristina [name] MRN: [id number] Date of Birth: [birth year]   Medicare Important Message Given:  Yes - Important Message mailed due to current National Emergency   Verbal consent obtained due to current National Emergency  Relationship to patient: Self Contact Name: Deetra Booton Call Date: 07/13/20  Time: 1446 Phone: [phone] Outcome: No Answer/Busy Important Message mailed to: Patient address on file    Delorse Lek 07/13/2020, 2:47 PM

## 2020-08-03 ENCOUNTER — Other Ambulatory Visit: Payer: Self-pay | Admitting: *Deleted

## 2020-08-03 NOTE — Patient Outreach (Signed)
Member screened for potential [facility] Care Management needs.  Communication sent to [facility] SW to inquire about transition plans and potential [facility] Care Management needs.   Will continue to follow while member resides in SNF.    Raiford Noble, MSN, RN,BSN [facility] Post Acute Care Coordinator [phone] [facility]) [phone]  (Toll free office)

## 2020-08-13 ENCOUNTER — Other Ambulatory Visit: Payer: Self-pay | Admitting: *Deleted

## 2020-08-13 NOTE — Patient Outreach (Signed)
Member screened for potential [facility] Care Management needs.  Kristina [name] is receiving skilled therapy at [facility].   Communication received from SNF SW indicating member is from home alone. Her son lives on the property. However, her daughter has been primary contact.   Unsure of transition plan at this time.   Will continue to follow for potential [facility] needs.  [name], MSN, RN,BSN [place] Acute Care Coordinator [phone] [facility]) [phone]  (Toll free office)

## 2020-08-21 ENCOUNTER — Emergency Department (HOSPITAL_COMMUNITY): Payer: Medicare Other

## 2020-08-21 ENCOUNTER — Encounter (HOSPITAL_COMMUNITY): Payer: Self-pay | Admitting: Emergency Medicine

## 2020-08-21 ENCOUNTER — Inpatient Hospital Stay (HOSPITAL_COMMUNITY)
Admission: EM | Admit: 2020-08-21 | Discharge: 2020-09-01 | DRG: 871 | Disposition: E | Payer: Medicare Other | Source: Skilled Nursing Facility | Attending: Family Medicine | Admitting: Family Medicine

## 2020-08-21 ENCOUNTER — Other Ambulatory Visit: Payer: Self-pay

## 2020-08-21 DIAGNOSIS — I130 Hypertensive heart and chronic kidney disease with heart failure and stage 1 through stage 4 chronic kidney disease, or unspecified chronic kidney disease: Secondary | ICD-10-CM | POA: Diagnosis present

## 2020-08-21 DIAGNOSIS — E785 Hyperlipidemia, unspecified: Secondary | ICD-10-CM | POA: Diagnosis present

## 2020-08-21 DIAGNOSIS — J9621 Acute and chronic respiratory failure with hypoxia: Secondary | ICD-10-CM | POA: Diagnosis present

## 2020-08-21 DIAGNOSIS — D631 Anemia in chronic kidney disease: Secondary | ICD-10-CM | POA: Diagnosis present

## 2020-08-21 DIAGNOSIS — R54 Age-related physical debility: Secondary | ICD-10-CM | POA: Diagnosis present

## 2020-08-21 DIAGNOSIS — N390 Urinary tract infection, site not specified: Secondary | ICD-10-CM | POA: Diagnosis present

## 2020-08-21 DIAGNOSIS — Z79899 Other long term (current) drug therapy: Secondary | ICD-10-CM

## 2020-08-21 DIAGNOSIS — Z681 Body mass index (BMI) 19 or less, adult: Secondary | ICD-10-CM | POA: Diagnosis not present

## 2020-08-21 DIAGNOSIS — T68XXXA Hypothermia, initial encounter: Secondary | ICD-10-CM

## 2020-08-21 DIAGNOSIS — L89153 Pressure ulcer of sacral region, stage 3: Secondary | ICD-10-CM | POA: Diagnosis present

## 2020-08-21 DIAGNOSIS — H9190 Unspecified hearing loss, unspecified ear: Secondary | ICD-10-CM | POA: Diagnosis present

## 2020-08-21 DIAGNOSIS — K7200 Acute and subacute hepatic failure without coma: Secondary | ICD-10-CM | POA: Diagnosis not present

## 2020-08-21 DIAGNOSIS — I5032 Chronic diastolic (congestive) heart failure: Secondary | ICD-10-CM | POA: Diagnosis present

## 2020-08-21 DIAGNOSIS — N179 Acute kidney failure, unspecified: Secondary | ICD-10-CM

## 2020-08-21 DIAGNOSIS — I714 Abdominal aortic aneurysm, without rupture: Secondary | ICD-10-CM | POA: Diagnosis present

## 2020-08-21 DIAGNOSIS — A419 Sepsis, unspecified organism: Secondary | ICD-10-CM | POA: Diagnosis not present

## 2020-08-21 DIAGNOSIS — A4150 Gram-negative sepsis, unspecified: Secondary | ICD-10-CM | POA: Diagnosis present

## 2020-08-21 DIAGNOSIS — Z809 Family history of malignant neoplasm, unspecified: Secondary | ICD-10-CM

## 2020-08-21 DIAGNOSIS — E860 Dehydration: Secondary | ICD-10-CM | POA: Diagnosis present

## 2020-08-21 DIAGNOSIS — R64 Cachexia: Secondary | ICD-10-CM | POA: Diagnosis present

## 2020-08-21 DIAGNOSIS — Z9981 Dependence on supplemental oxygen: Secondary | ICD-10-CM

## 2020-08-21 DIAGNOSIS — I10 Essential (primary) hypertension: Secondary | ICD-10-CM | POA: Diagnosis not present

## 2020-08-21 DIAGNOSIS — G9340 Encephalopathy, unspecified: Secondary | ICD-10-CM | POA: Diagnosis not present

## 2020-08-21 DIAGNOSIS — Z8249 Family history of ischemic heart disease and other diseases of the circulatory system: Secondary | ICD-10-CM

## 2020-08-21 DIAGNOSIS — N1832 Chronic kidney disease, stage 3b: Secondary | ICD-10-CM | POA: Diagnosis present

## 2020-08-21 DIAGNOSIS — R680 Hypothermia, not associated with low environmental temperature: Secondary | ICD-10-CM | POA: Diagnosis present

## 2020-08-21 DIAGNOSIS — I248 Other forms of acute ischemic heart disease: Secondary | ICD-10-CM | POA: Diagnosis present

## 2020-08-21 DIAGNOSIS — J181 Lobar pneumonia, unspecified organism: Secondary | ICD-10-CM | POA: Diagnosis not present

## 2020-08-21 DIAGNOSIS — E43 Unspecified severe protein-calorie malnutrition: Secondary | ICD-10-CM | POA: Diagnosis present

## 2020-08-21 DIAGNOSIS — I70219 Atherosclerosis of native arteries of extremities with intermittent claudication, unspecified extremity: Secondary | ICD-10-CM | POA: Diagnosis present

## 2020-08-21 DIAGNOSIS — R6521 Severe sepsis with septic shock: Secondary | ICD-10-CM | POA: Diagnosis present

## 2020-08-21 DIAGNOSIS — J189 Pneumonia, unspecified organism: Secondary | ICD-10-CM | POA: Diagnosis present

## 2020-08-21 DIAGNOSIS — N183 Chronic kidney disease, stage 3 unspecified: Secondary | ICD-10-CM | POA: Diagnosis present

## 2020-08-21 DIAGNOSIS — Z66 Do not resuscitate: Secondary | ICD-10-CM | POA: Diagnosis present

## 2020-08-21 DIAGNOSIS — R6520 Severe sepsis without septic shock: Secondary | ICD-10-CM | POA: Diagnosis not present

## 2020-08-21 DIAGNOSIS — R0602 Shortness of breath: Secondary | ICD-10-CM | POA: Diagnosis present

## 2020-08-21 DIAGNOSIS — Z515 Encounter for palliative care: Secondary | ICD-10-CM | POA: Diagnosis not present

## 2020-08-21 DIAGNOSIS — E46 Unspecified protein-calorie malnutrition: Secondary | ICD-10-CM | POA: Diagnosis present

## 2020-08-21 DIAGNOSIS — J188 Other pneumonia, unspecified organism: Secondary | ICD-10-CM

## 2020-08-21 DIAGNOSIS — E875 Hyperkalemia: Secondary | ICD-10-CM | POA: Diagnosis not present

## 2020-08-21 DIAGNOSIS — L8990 Pressure ulcer of unspecified site, unspecified stage: Secondary | ICD-10-CM | POA: Insufficient documentation

## 2020-08-21 DIAGNOSIS — Z20822 Contact with and (suspected) exposure to covid-19: Secondary | ICD-10-CM | POA: Diagnosis present

## 2020-08-21 DIAGNOSIS — Z8679 Personal history of other diseases of the circulatory system: Secondary | ICD-10-CM

## 2020-08-21 DIAGNOSIS — D509 Iron deficiency anemia, unspecified: Secondary | ICD-10-CM | POA: Diagnosis present

## 2020-08-21 DIAGNOSIS — Z7982 Long term (current) use of aspirin: Secondary | ICD-10-CM

## 2020-08-21 DIAGNOSIS — Z8711 Personal history of peptic ulcer disease: Secondary | ICD-10-CM

## 2020-08-21 DIAGNOSIS — Z87891 Personal history of nicotine dependence: Secondary | ICD-10-CM

## 2020-08-21 LAB — PROCALCITONIN: Procalcitonin: 0.15 ng/mL

## 2020-08-21 LAB — HEPATIC FUNCTION PANEL
ALT: 119 U/L — ABNORMAL HIGH (ref 0–44)
AST: 135 U/L — ABNORMAL HIGH (ref 15–41)
Albumin: 3 g/dL — ABNORMAL LOW (ref 3.5–5.0)
Alkaline Phosphatase: 118 U/L (ref 38–126)
Bilirubin, Direct: <0.1 mg/dL (ref 0.0–0.2)
Total Bilirubin: 0.5 mg/dL (ref 0.3–1.2)
Total Protein: 6.3 g/dL — ABNORMAL LOW (ref 6.5–8.1)

## 2020-08-21 LAB — BASIC METABOLIC PANEL
Anion gap: 15 (ref 5–15)
BUN: 46 mg/dL — ABNORMAL HIGH (ref 8–23)
CO2: 27 mmol/L (ref 22–32)
Calcium: 9.3 mg/dL (ref 8.9–10.3)
Chloride: 97 mmol/L — ABNORMAL LOW (ref 98–111)
Creatinine, Ser: 1.77 mg/dL — ABNORMAL HIGH (ref 0.44–1.00)
GFR, Estimated: 27 mL/min — ABNORMAL LOW (ref >60)
Glucose, Bld: 140 mg/dL — ABNORMAL HIGH (ref 70–99)
Potassium: 5 mmol/L (ref 3.5–5.1)
Sodium: 139 mmol/L (ref 135–145)

## 2020-08-21 LAB — LACTIC ACID, PLASMA
Lactic Acid, Venous: 2.9 mmol/L (ref 0.5–1.9)
Lactic Acid, Venous: 2.8 mmol/L (ref 0.5–1.9)

## 2020-08-21 LAB — CBG MONITORING, ED: Glucose-Capillary: 79 mg/dL (ref 70–99)

## 2020-08-21 LAB — URINALYSIS, ROUTINE W REFLEX MICROSCOPIC
Bilirubin Urine: NEGATIVE
Glucose, UA: NEGATIVE mg/dL
Ketones, ur: NEGATIVE mg/dL
Nitrite: NEGATIVE
Protein, ur: 100 mg/dL — AB
Specific Gravity, Urine: 1.011 (ref 1.005–1.030)
WBC, UA: >50 WBC/hpf — ABNORMAL HIGH (ref 0–5)
pH: 6 (ref 5.0–8.0)

## 2020-08-21 LAB — SARS CORONAVIRUS 2 BY RT PCR (HOSPITAL ORDER, PERFORMED IN ~~LOC~~ HOSPITAL LAB): SARS Coronavirus 2: NEGATIVE

## 2020-08-21 LAB — CBC
HCT: 24.2 % — ABNORMAL LOW (ref 36.0–46.0)
Hemoglobin: 7.1 g/dL — ABNORMAL LOW (ref 12.0–15.0)
MCH: 29.5 pg (ref 26.0–34.0)
MCHC: 29.3 g/dL — ABNORMAL LOW (ref 30.0–36.0)
MCV: 100.4 fL — ABNORMAL HIGH (ref 80.0–100.0)
Platelets: 563 10*3/uL — ABNORMAL HIGH (ref 150–400)
RBC: 2.41 MIL/uL — ABNORMAL LOW (ref 3.87–5.11)
RDW: 18.3 % — ABNORMAL HIGH (ref 11.5–15.5)
WBC: 13.1 10*3/uL — ABNORMAL HIGH (ref 4.0–10.5)
nRBC: 0 % (ref 0.0–0.2)

## 2020-08-21 LAB — PROTIME-INR
INR: 1 (ref 0.8–1.2)
Prothrombin Time: 13.1 seconds (ref 11.4–15.2)

## 2020-08-21 LAB — APTT: aPTT: 33 seconds (ref 24–36)

## 2020-08-21 LAB — BRAIN NATRIURETIC PEPTIDE: B Natriuretic Peptide: 752.6 pg/mL — ABNORMAL HIGH (ref 0.0–100.0)

## 2020-08-21 LAB — TROPONIN I (HIGH SENSITIVITY)
Troponin I (High Sensitivity): 372 ng/L (ref <18)
Troponin I (High Sensitivity): 477 ng/L (ref <18)

## 2020-08-21 MED ORDER — ASPIRIN EC 81 MG PO TBEC: 81.0000 mg | DELAYED_RELEASE_TABLET | Freq: Every day | ORAL | Status: DC

## 2020-08-21 MED ORDER — SODIUM CHLORIDE 0.9 % IV BOLUS: 250.0000 mL | Freq: Once | INTRAVENOUS | Status: DC

## 2020-08-21 MED ORDER — VANCOMYCIN HCL IN DEXTROSE 1-5 GM/200ML-% IV SOLN
1000.0000 mg | Freq: Once | INTRAVENOUS | Status: AC
  Administered 2020-08-21: 1000 mg via INTRAVENOUS
  Filled 2020-08-21: qty 200

## 2020-08-21 MED ORDER — DOCUSATE SODIUM 100 MG PO CAPS
100.0000 mg | ORAL_CAPSULE | Freq: Every day | ORAL | Status: DC
  Administered 2020-08-22 – 2020-08-23 (×2): 100 mg via ORAL
  Filled 2020-08-21 (×4): qty 1

## 2020-08-21 MED ORDER — POLYETHYLENE GLYCOL 3350 17 G PO PACK: 17.0000 g | PACK | Freq: Every day | ORAL | Status: DC | PRN

## 2020-08-21 MED ORDER — FUROSEMIDE 10 MG/ML IJ SOLN
20.0000 mg | Freq: Four times a day (QID) | INTRAMUSCULAR | Status: AC
  Administered 2020-08-22 (×2): 20 mg via INTRAVENOUS
  Filled 2020-08-21 (×3): qty 2

## 2020-08-21 MED ORDER — HYDRALAZINE HCL 25 MG PO TABS: 25.0000 mg | ORAL_TABLET | Freq: Three times a day (TID) | ORAL | Status: DC

## 2020-08-21 MED ORDER — LACTATED RINGERS IV SOLN: INTRAVENOUS | Status: AC

## 2020-08-21 MED ORDER — LACTATED RINGERS IV BOLUS
1000.0000 mL | Freq: Once | INTRAVENOUS | Status: AC
  Administered 2020-08-21: 1000 mL via INTRAVENOUS

## 2020-08-21 MED ORDER — ENSURE ENLIVE PO LIQD
237.0000 mL | Freq: Two times a day (BID) | ORAL | Status: DC
  Administered 2020-08-23 (×2): 237 mL via ORAL
  Filled 2020-08-21: qty 237

## 2020-08-21 MED ORDER — ONDANSETRON HCL 4 MG PO TABS
4.0000 mg | ORAL_TABLET | Freq: Four times a day (QID) | ORAL | Status: DC | PRN
  Administered 2020-08-23: 4 mg via ORAL
  Filled 2020-08-21: qty 1

## 2020-08-21 MED ORDER — PANTOPRAZOLE SODIUM 40 MG PO TBEC
40.0000 mg | DELAYED_RELEASE_TABLET | Freq: Two times a day (BID) | ORAL | Status: DC
  Administered 2020-08-21: 40 mg via ORAL
  Filled 2020-08-21: qty 1

## 2020-08-21 MED ORDER — RISAQUAD PO CAPS
1.0000 | ORAL_CAPSULE | Freq: Every day | ORAL | Status: DC
  Administered 2020-08-23: 1 via ORAL
  Filled 2020-08-21 (×4): qty 1

## 2020-08-21 MED ORDER — LACTATED RINGERS IV SOLN: INTRAVENOUS | Status: DC

## 2020-08-21 MED ORDER — TRAMADOL HCL 50 MG PO TABS
50.0000 mg | ORAL_TABLET | Freq: Four times a day (QID) | ORAL | Status: DC | PRN
  Administered 2020-08-23 (×2): 50 mg via ORAL
  Filled 2020-08-21 (×3): qty 1

## 2020-08-21 MED ORDER — ACETAMINOPHEN 500 MG PO TABS
500.0000 mg | ORAL_TABLET | Freq: Four times a day (QID) | ORAL | Status: DC
  Administered 2020-08-21 – 2020-08-23 (×7): 500 mg via ORAL
  Filled 2020-08-21 (×13): qty 1

## 2020-08-21 MED ORDER — VANCOMYCIN HCL 500 MG/100ML IV SOLN: 500.0000 mg | INTRAVENOUS | Status: DC

## 2020-08-21 MED ORDER — AZITHROMYCIN 500 MG PO TABS
500.0000 mg | ORAL_TABLET | Freq: Every day | ORAL | Status: DC
  Administered 2020-08-21 – 2020-08-24 (×4): 500 mg via ORAL
  Filled 2020-08-21: qty 1
  Filled 2020-08-21 (×2): qty 2
  Filled 2020-08-21: qty 1

## 2020-08-21 MED ORDER — SODIUM CHLORIDE 0.9 % IV SOLN
2.0000 g | INTRAVENOUS | Status: DC
  Administered 2020-08-21 – 2020-08-23 (×3): 2 g via INTRAVENOUS
  Filled 2020-08-21 (×3): qty 20

## 2020-08-21 MED ORDER — LACTATED RINGERS IV BOLUS
250.0000 mL | Freq: Once | INTRAVENOUS | Status: AC
  Administered 2020-08-21: 250 mL via INTRAVENOUS

## 2020-08-21 MED ORDER — SODIUM CHLORIDE 0.9 % IV SOLN
2.0000 g | Freq: Once | INTRAVENOUS | Status: AC
  Administered 2020-08-21: 2 g via INTRAVENOUS
  Filled 2020-08-21: qty 2

## 2020-08-21 NOTE — ED Notes (Signed)
Critical value taken from lab,lactic 2.9.  MD notified.

## 2020-08-21 NOTE — ED Provider Notes (Signed)
[place] EMERGENCY DEPARTMENT Provider Note   CSN: [id number] Arrival date & time: 08/02/2020  1421     History Chief Complaint  Patient presents with  . Chest Pain  . Shortness of Breath    Kristina [name] is a 87 y.o. female with pertinent past medical history of AAA, CKD stage III, hyperlipidemia, hypertension, PAD presents the emergency department today for shortness of breath and chest pain via EMS.  Patient lives at assisted living facility, was called by EMS due to hypoxia reading of 70% at facility.  When they went to facility, oxygen was not working properly and she was actually not on any oxygen and patient was at 70%, patient is normally on 5 L.  When they put her on 5 L of oxygen she was 100%.  They brought her here because she was still complaining of shortness of breath and chest pain.  Patient states that she started having chest pain 2 days ago, with shortness of breath.  Patient is a very poor historian, denies any other complaints at this time.  States that she could have had a small mild cough for the past couple of days.  Has been vaccinated against COVID.  Denies any fevers, chills, nausea, vomiting, abdominal pain.  Denies any sick contacts.  Denies any urinary symptoms, no other complaints at this time.  Patient is asking for pain medication due to her chest pain.  Per EMS they did give her 324 of aspirin and 1 sublingual nitro.  Pt has MOST form.   HPI     Past Medical History:  Diagnosis Date  . AAA (abdominal aortic aneurysm) (HCC)    Mild - measuring 2.8x2.8cm  . Acute hyponatremia 11/2016  . 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 HOH (hard of hearing)   . Hyperlipidemia    Well-controlled  . Hypertension   . PAD (peripheral artery disease) (HCC)    LEA Dopplers: Right external iliac 40%, right mid SFA 70-99% with occlusion in Hunter's canal.  Angiography. LEFT common iliac 60%, left SFA 70-9%, popliteal 50%; RIGHT posterior tibial occluded;; carotid Dopplers in 2009: Less than 50% stenosis.  . Renal artery stenosis [facility])     Patient Active Problem List   Diagnosis Date Noted  . Protein-calorie malnutrition, severe 07/10/2020  . Symptomatic anemia 07/06/2020  . GI bleed 07/06/2020  . Sepsis due to cellulitis ([place]) 07/06/2020  . Lt Hip fracture ([place]) 08/17/2017  . Fecal impaction in rectum ([place]) 08/17/2017  . Bilateral carotid bruits 07/18/2017  . Dizziness 12/09/2016  . Weakness 12/09/2016  . Malnutrition of moderate degree 12/03/2016  . Acute hyponatremia 12/01/2016  . Protein-calorie malnutrition ([place]) 12/01/2016  . Anemia 12/01/2016  . Pancreatic calcification 12/01/2016  . Abdominal pain, epigastric 12/01/2016  . Hyponatremia   . Claudication ([place]) 06/27/2016  . 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 Essential hypertension   . Hyperlipidemia with target low density lipoprotein (LDL) cholesterol less than 70 mg/dL   . Chronic kidney disease (CKD), stage III (moderate) (HCC)   . AAA (abdominal aortic aneurysm) [facility])     Past Surgical History:  Procedure Laterality Date  . BIOPSY  07/07/2020   Procedure: BIOPSY;  Surgeon: Carol Ada, MD;  Location: [place];  Service: Endoscopy;;  . ESOPHAGOGASTRODUODENOSCOPY Left 07/07/2020  Procedure: ESOPHAGOGASTRODUODENOSCOPY (EGD);  Surgeon: Carol Ada, MD;  Location: [place];  Service: Endoscopy;  Laterality: Left;  . INTRAMEDULLARY (IM) NAIL INTERTROCHANTERIC Left 08/18/2017   Procedure: INTRAMEDULLARY (IM) NAIL INTERTROCHANTRIC;  Surgeon: Leandrew Koyanagi, MD;  Location: [place];  Service: Orthopedics;  Laterality: Left;  . LOWER EXTREMITY ARTERIAL DOPPLER  05/30/2011   Right EIA->50% daimeter reduction, Rt SFA 70-99% diameter reduction, Rt SFA-occlusive disease  at Hunter's canal, Rt PTA-appeared occluded, Lft CIA-moderate amount of calcific plaque w/ >60% diameter reduction, Lft prox SFA 70-99% diameter reduction, Lft POP-large amount of irregular mixed suggesting <50% diameter reduction  . NM MYOVIEW LTD  06/25/2008   No scintigraphic evidence of inducible myocardial ischemia  . PERIPHERAL VASCULAR ANGIOGRAM  07/13/2009   No intervention - "plan on doing staged intervention in near future"  . TRANSTHORACIC ECHOCARDIOGRAM  11/14/2007   EF >55%, mild mitral and tricuspid regurg.     OB History   No obstetric history on file.     Family History  Problem Relation Age of Onset  . Heart attack Brother   . Cancer Brother        Bone cancer    Social History   Tobacco Use  . Smoking status: Former Smoker    Types: Cigarettes    Quit date: 07/11/2001    Years since quitting: 19.1  . Smokeless tobacco: Never Used  Vaping Use  . Vaping Use: Never used  Substance Use Topics  . Alcohol use: Yes    Alcohol/week: 1.0 standard drink    Types: 1 Glasses of wine per week  . Drug use: No    Home Medications Prior to Admission medications   Medication Sig Start Date End Date Taking? Authorizing Provider  aspirin 81 MG tablet Take 81 mg by mouth daily.    [provider]  Coenzyme Q10 (COQ10) 100 MG CAPS Take 100 mg by mouth 3 (three) times daily with meals. Patient not taking: Reported on 12/09/2019 07/17/17   Leonie Man, MD  Docusate Sodium (COLACE PO) Take 1 tablet by mouth daily.    [provider]  feeding supplement, ENSURE ENLIVE, (ENSURE ENLIVE) LIQD Take 237 mLs by mouth 2 (two) times daily between meals. Patient not taking: Reported on 12/09/2019 08/21/17   Raiford Noble Latif, DO  hydrALAZINE (APRESOLINE) 25 MG tablet Take 1 tablet (25 mg total) by mouth 3 (three) times daily. 07/13/20   Pokhrel, Corrie Mckusick, MD  ondansetron (ZOFRAN) 4 MG tablet Take 1 tablet (4 mg total) by mouth every 6 (six) hours as needed for  nausea or vomiting. 07/13/20   Pokhrel, Corrie Mckusick, MD  pantoprazole (PROTONIX) 40 MG tablet Take 1 tablet (40 mg total) by mouth 2 (two) times daily for 30 days, THEN 1 tablet (40 mg total) daily. 07/13/20 09/11/20  Pokhrel, Corrie Mckusick, MD  polyethylene glycol (MIRALAX / GLYCOLAX) packet Take 17 g by mouth daily as needed for mild constipation.     [provider]  Probiotic Product (ALIGN) 4 MG CAPS Take 4 mg by mouth daily after lunch.     [provider]  Vitamin D, Ergocalciferol, (DRISDOL) 1.25 MG (50000 UNIT) CAPS capsule Take 50,000 Units by mouth every 7 (seven) days.    [provider]    Allergies    Lipitor [atorvastatin], Pletal [cilostazol], and Simvastatin  Review of Systems   Review of Systems  Constitutional: Negative for chills, diaphoresis, fatigue and fever.  HENT: Negative for congestion, sore throat and trouble swallowing.  Eyes: Negative for pain and visual disturbance.  Respiratory: Positive for cough and shortness of breath. Negative for wheezing.   Cardiovascular: Positive for chest pain. Negative for palpitations and leg swelling.  Gastrointestinal: Negative for abdominal distention, abdominal pain, diarrhea, nausea and vomiting.  Genitourinary: Negative for difficulty urinating.  Musculoskeletal: Negative for back pain, neck pain and neck stiffness.  Skin: Negative for pallor.  Neurological: Negative for dizziness, speech difficulty, weakness and headaches.  Psychiatric/Behavioral: Negative for confusion.    Physical Exam Updated Vital Signs BP (!) 140/103   Pulse 76   Temp (!) 94.8 F (34.9 C) (Rectal)   Resp (!) 25   Ht 4\' 10"  (1.473 m)   Wt 42 kg   SpO2 100%   BMI 19.35 kg/m   Physical Exam Constitutional:      General: She is in acute distress.     Appearance: Normal appearance. She is ill-appearing. She is not toxic-appearing or diaphoretic.     Comments: Patient is a ill-appearing 87 year old, looks cachectic.  Patient is  tachypneic, speaking in full sentences.  Is using accessory muscle use.  Handling secretions well.  HENT:     Mouth/Throat:     Mouth: Mucous membranes are moist.     Pharynx: Oropharynx is clear.  Eyes:     General: No scleral icterus.    Extraocular Movements: Extraocular movements intact.     Pupils: Pupils are equal, round, and reactive to light.  Cardiovascular:     Rate and Rhythm: Normal rate and regular rhythm.     Pulses: Normal pulses.     Heart sounds: Normal heart sounds.  Pulmonary:     Effort: Tachypnea, accessory muscle usage and respiratory distress present.     Breath sounds: No stridor. Examination of the left-upper field reveals rales. Examination of the left-middle field reveals rales. Examination of the left-lower field reveals rales. Decreased breath sounds and rales present. No wheezing or rhonchi.  Chest:     Chest wall: No tenderness.       Comments: Patient is severely tender to right upper chest, no erythema or rashes present.  No bony tenderness or crepitus felt. Abdominal:     General: Abdomen is flat. There is no distension.     Palpations: Abdomen is soft.     Tenderness: There is no abdominal tenderness. There is no guarding or rebound.  Musculoskeletal:        General: No swelling or tenderness. Normal range of motion.     Cervical back: Normal range of motion and neck supple. No rigidity.       Back:     Right lower leg: No edema.     Left lower leg: No edema.     Comments: Patient is severely tender to these areas on her right upper back, no rashes palpated.  No warmth or erythema.  Skin:    General: Skin is warm and dry.     Capillary Refill: Capillary refill takes less than 2 seconds.     Coloration: Skin is not pale.     Comments: Leg lesion does not appear infected  Neurological:     General: No focal deficit present.     Mental Status: She is alert and oriented to person, place, and time.  Psychiatric:        Mood and Affect: Mood  normal.        Behavior: Behavior normal.     ED Results / Procedures / Treatments   Labs (all  labs ordered are listed, but only abnormal results are displayed) Labs Reviewed  BASIC METABOLIC PANEL - Abnormal; Notable for the following components:      Result Value   Chloride 97 (*)    Glucose, Bld 140 (*)    BUN 46 (*)    Creatinine, Ser 1.77 (*)    GFR, Estimated 27 (*)    All other components within normal limits  CBC - Abnormal; Notable for the following components:   WBC 13.1 (*)    RBC 2.41 (*)    Hemoglobin 7.1 (*)    HCT 24.2 (*)    MCV 100.4 (*)    MCHC 29.3 (*)    RDW 18.3 (*)    Platelets 563 (*)    All other components within normal limits  LACTIC ACID, PLASMA - Abnormal; Notable for the following components:   Lactic Acid, Venous 2.9 (*)    All other components within normal limits  LACTIC ACID, PLASMA - Abnormal; Notable for the following components:   Lactic Acid, Venous 2.8 (*)    All other components within normal limits  TROPONIN I (HIGH SENSITIVITY) - Abnormal; Notable for the following components:   Troponin I (High Sensitivity) 372 (*)    All other components within normal limits  TROPONIN I (HIGH SENSITIVITY) - Abnormal; Notable for the following components:   Troponin I (High Sensitivity) 477 (*)    All other components within normal limits  SARS CORONAVIRUS 2 BY RT PCR (HOSPITAL ORDER, PERFORMED IN  HOSPITAL LAB)  CULTURE, BLOOD (ROUTINE X 2)  CULTURE, BLOOD (ROUTINE X 2)  URINE CULTURE  MRSA PCR SCREENING  PROTIME-INR  APTT  URINALYSIS, ROUTINE W REFLEX MICROSCOPIC  HEPATIC FUNCTION PANEL  TYPE AND SCREEN    EKG EKG Interpretation  Date/Time:  Friday August 21 2020 14:31:58 EST Ventricular Rate:  80 PR Interval:    QRS Duration: 79 QT Interval:  380 QTC Calculation: 439 R Axis:   -9 Text Interpretation: Sinus rhythm Consider left ventricular hypertrophy Nonspecific T abnormalities, lateral leads Confirmed by Kristine Royal [phone]) on 08/20/2020 2:39:45 PM   Radiology DG Chest Portable 1 View  Result Date: 08/17/2020 CLINICAL DATA:  Shortness of breath EXAM: PORTABLE CHEST 1 VIEW COMPARISON:  07/10/2020 FINDINGS: Bilateral interstitial thickening. Patchy alveolar opacities throughout the right lung. Small right pleural effusion. No pneumothorax. Stable cardiomegaly. No acute osseous abnormality. IMPRESSION: Bilateral interstitial thickening and patchy alveolar opacities throughout the right lung and mild interstitial thickening of the left lung with cardiomegaly concerning for multilobar pneumonia versus asymmetric pulmonary edema. Electronically Signed   By: Elige Ko   On: 08/24/2020 15:06    Procedures .Critical Care Performed by: Farrel Gordon, PA-C Authorized by: Farrel Gordon, PA-C   Critical care provider statement:    Critical care time (minutes):  45   Critical care was necessary to treat or prevent imminent or life-threatening deterioration of the following conditions:  Sepsis   Critical care was time spent personally by me on the following activities:  Discussions with consultants, evaluation of patient's response to treatment, examination of patient, ordering and performing treatments and interventions, ordering and review of laboratory studies, ordering and review of radiographic studies, pulse oximetry, re-evaluation of patient's condition, obtaining history from patient or surrogate and review of old charts   (including critical care time)  Medications Ordered in ED Medications  lactated ringers infusion ( Intravenous New Bag/Given 08/29/2020 1710)  vancomycin (VANCOREADY) IVPB 500 mg/100 mL (has no administration in  time range)  sodium chloride 0.9 % bolus 250 mL (has no administration in time range)  lactated ringers bolus 1,000 mL (0 mLs Intravenous Stopped 19-Sep-2020 1630)  ceFEPIme (MAXIPIME) 2 g in sodium chloride 0.9 % 100 mL IVPB (0 g Intravenous Stopped 09-19-2020 1630)  vancomycin  (VANCOCIN) IVPB 1000 mg/200 mL premix (0 mg Intravenous Stopped September 19, 2020 1630)    ED Course  I have reviewed the triage vital signs and the nursing notes.  Pertinent labs & imaging results that were available during my care of the patient were reviewed by me and considered in my medical decision making (see chart for details).    MDM Rules/Calculators/A&P                          Kristina [name] is a 87 y.o. female with pertinent past medical history of AAA, CKD stage III, hyperlipidemia, hypertension, PAD presents the emergency department today for shortness of breath and chest pain via EMS.  Patient appears very uncomfortable, severe tenderness to right upper chest and back.  High suspicion of lung etiology at this time.  Will initiate sepsis labs at this time, since patient does have temperature of 94.4 and is tachypneic.  Does meet SIRS criteria.  Chest x-ray does show questionable multifocal pneumonia on right side or asymmetric pulm edema.  I do favor multifocal pneumonia at this time, however will obtain CT to evaluate this further.  We will also start IV antibiotics for HAP coverage and IV fluid at this time. 30cc/kg given. Lactate 2.9  Troponin 372, did consult cardiology, triage nurse Trish, they recommended to cycle troponins, and consult cardiology again tomorrow once pt is admitted.   Other work-up with repeat elevated lactic acid of 2.8, white count of 13.1.  Hemoglobin 7.1, patient does appear to have baseline of hemoglobin of 7-8.  Creatinine elevated to 1.77, BUN 46.  Fluids are been initiated.  COVID-negative.  Repeat troponin 477.  Patient is only having chest pain to right side where infiltrates are, is tender to palpation.  Troponins elevated most likely due to demand ischemia, elevated lactate is not clearing.  Patient's chest pain is most likely from pleurisy.  Patient given EKG did not show any new ischemia, will obtain repeat EKG. temperature is still 94.8, patient on bear  hugger at this time.  Spoke to [facility] hospitalist, Dr. Linda Hedges who will admit.  They are aware of second troponin being 477.  Awaiting CT scan.  The patient appears reasonably stabilized for admission considering the current resources, flow, and capabilities available in the ED at this time, and I doubt any other [facility] requiring further screening and/or treatment in the ED prior to admission.  The patient appears reasonably stabilized for admission considering the current resources, flow, and capabilities available in the ED at this time, and I doubt any other [facility] requiring further screening and/or treatment in the ED prior to admission.  I discussed this case with my attending physician who cosigned this note including patient's presenting symptoms, physical exam, and planned diagnostics and interventions. Attending physician stated agreement with plan or made changes to plan which were implemented.   Attending physician assessed patient at bedside.   Final Clinical Impression(s) / ED Diagnoses Final diagnoses:  Sepsis with acute renal failure without septic shock, due to unspecified organism, unspecified acute renal failure type ([place])  Multifocal pneumonia  Hypothermia, initial encounter    Rx / DC Orders ED Discharge Orders  None       Alfredia Client, PA-C 08/05/2020 1845    Valarie Merino, MD 08/22/20 [phone]

## 2020-08-21 NOTE — Sepsis Progress Note (Signed)
Sepsis monitoring complete 

## 2020-08-21 NOTE — ED Notes (Signed)
Pt changed.  Redness noted to perineal area.

## 2020-08-21 NOTE — ED Notes (Signed)
Patient transported to CT 

## 2020-08-21 NOTE — ED Triage Notes (Signed)
Arrived via EMS from [facility] for chest pain and shortness of breath. EMS reported patient normally on 5L [place] however found the oxygen machine not working and was on room air.  Pulse oximetry 70's. Placed on 5l [place] pulse oximetry increased to 100%. EMS administered 1 nitro SL and Aspirin 324mg  PO. Chest pain currently 9/10 sharp.

## 2020-08-21 NOTE — H&P (Addendum)
History and Physical    Kristina [name] DOB: [birth year] DOA: 08/04/2020  PCP: Holland Commons, FNP (Confirm with patient/family/NH records and if not entered, this has to be entered at [facility] point of entry) Patient coming from: AL facility  I have personally briefly reviewed patient's old medical records in [place]  Chief Complaint: SOB, right sided chest pain  HPI: Kristina [name] is a 87 y.o. female with medical history significant of AAA, CKD stage III, hyperlipidemia, hypertension, PAD, iron deficiency anemia presents the emergency department today for shortness of breath and chest pain via EMS.  Patient lives at assisted living facility, was called by EMS due to hypoxia reading of 70% at facility.  When they went to facility, oxygen was not working properly and she was actually not on any oxygen and patient was at 70%, patient is normally on 5 L.  When they put her on 5 L of oxygen she was 100%.  They brought her here because she was still complaining of shortness of breath and chest pain.  Patient states that she started having chest pain 2 days ago, with shortness of breath.  Patient is a very poor historian, denies any other complaints at this time.  States that she could have had a small mild cough for the past couple of days.  Has been vaccinated against COVID.  Denies any fevers, chills, nausea, vomiting, abdominal pain.  Denies any sick contacts.  Denies any urinary symptoms, no other complaints at this time.  Patient is asking for pain medication due to her chest pain.  Per EMS they did give her 324 of aspirin and 1 sublingual nitro.  Pt has MOST form  ED Course: T 94.8 140/103  HR 76  RR 25. ED-PA exam notable for tacypnea, use of accessory muscles, respiratory distress, rales LUL, Lingula. Lab with glucose 140 Cr 1.77 ( last 1.35 07/01/20), WBC 13.1, Hgb 7.1. CXR with alevolar densities, question multi-lobar pneumonia vs asymmetrical pulmonary edema. Code sepsis  called 2/2 tachypnea, hypothermia, AKI - patient given 1 L LR, Cefepime 2g, Vancomycin 1g.   Review of Systems: As per HPI otherwise 10 point review of systems negative. Caveat-patient a difficult historian due being [facility]   Past Medical History:  Diagnosis Date  . AAA (abdominal aortic aneurysm) (HCC)    Mild - measuring 2.8x2.8cm  . Acute hyponatremia 11/2016  . 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 HOH (hard of hearing)   . Hyperlipidemia    Well-controlled  . Hypertension   . PAD (peripheral artery disease) (HCC)    LEA Dopplers: Right external iliac 40%, right mid SFA 70-99% with occlusion in Hunter's canal. Angiography. LEFT common iliac 60%, left SFA 70-9%, popliteal 50%; RIGHT posterior tibial occluded;; carotid Dopplers in 2009: Less than 50% stenosis.  . Renal artery stenosis [facility])     Past Surgical History:  Procedure Laterality Date  . BIOPSY  07/07/2020   Procedure: BIOPSY;  Surgeon: Carol Ada, MD;  Location: [facility] ENDOSCOPY;  Service: Endoscopy;;  . ESOPHAGOGASTRODUODENOSCOPY Left 07/07/2020   Procedure: ESOPHAGOGASTRODUODENOSCOPY (EGD);  Surgeon: Carol Ada, MD;  Location: [place];  Service: Endoscopy;  Laterality: Left;  . INTRAMEDULLARY (IM) NAIL INTERTROCHANTERIC Left 08/18/2017   Procedure: INTRAMEDULLARY (IM) NAIL INTERTROCHANTRIC;  Surgeon: Leandrew Koyanagi, MD;  Location: [place];  Service: Orthopedics;  Laterality: Left;  . LOWER EXTREMITY ARTERIAL DOPPLER  05/30/2011   Right EIA->50% daimeter reduction, Rt SFA 70-99% diameter reduction, Rt SFA-occlusive disease at Hunter's canal, Rt PTA-appeared occluded, Lft CIA-moderate amount of calcific plaque w/ >60% diameter reduction, Lft prox SFA 70-99% diameter reduction, Lft POP-large amount of irregular mixed suggesting <50% diameter reduction  . NM MYOVIEW LTD  06/25/2008   No scintigraphic evidence of inducible myocardial  ischemia  . PERIPHERAL VASCULAR ANGIOGRAM  07/13/2009   No intervention - "plan on doing staged intervention in near future"  . TRANSTHORACIC ECHOCARDIOGRAM  11/14/2007   EF >55%, mild mitral and tricuspid regurg.    Soc Hx - widowed. Had two children, has 4 grandchildren. Was living on her own until recent hospitalization when she was discharged to AL. Daughter is very attentive'.   reports that she quit smoking about 19 years ago. Her smoking use included cigarettes. She has never used smokeless tobacco. She reports current alcohol use of about 1.0 standard drink of alcohol per week. She reports that she does not use drugs.  Allergies  Allergen Reactions  . Lipitor [Atorvastatin] Other (See Comments)    Reaction:  Unknown   . Pletal [Cilostazol] Other (See Comments)    Reaction:  Unknown   . Simvastatin     MYALGIAS    Family History  Problem Relation Age of Onset  . Heart attack Brother   . Cancer Brother        Bone cancer     Prior to Admission medications   Medication Sig Start Date End Date Taking? Authorizing Provider  aspirin 81 MG tablet Take 81 mg by mouth daily.    [provider]  Coenzyme Q10 (COQ10) 100 MG CAPS Take 100 mg by mouth 3 (three) times daily with meals. Patient not taking: Reported on 12/09/2019 07/17/17   Leonie Man, MD  Docusate Sodium (COLACE PO) Take 1 tablet by mouth daily.    [provider]  feeding supplement, ENSURE ENLIVE, (ENSURE ENLIVE) LIQD Take 237 mLs by mouth 2 (two) times daily between meals. Patient not taking: Reported on 12/09/2019 08/21/17   Raiford Noble Latif, DO  hydrALAZINE (APRESOLINE) 25 MG tablet Take 1 tablet (25 mg total) by mouth 3 (three) times daily. 07/13/20   Pokhrel, Corrie Mckusick, MD  ondansetron (ZOFRAN) 4 MG tablet Take 1 tablet (4 mg total) by mouth every 6 (six) hours as needed for nausea or vomiting. 07/13/20   Pokhrel, Corrie Mckusick, MD  pantoprazole (PROTONIX) 40 MG tablet Take 1 tablet (40 mg total)  by mouth 2 (two) times daily for 30 days, THEN 1 tablet (40 mg total) daily. 07/13/20 09/11/20  Pokhrel, Corrie Mckusick, MD  polyethylene glycol (MIRALAX / GLYCOLAX) packet Take 17 g by mouth daily as needed for mild constipation.     [provider]  Probiotic Product (ALIGN) 4 MG CAPS Take 4 mg by mouth daily after lunch.     [provider]  Vitamin D, Ergocalciferol, (DRISDOL) 1.25 MG (50000 UNIT) CAPS capsule Take 50,000 Units by mouth every 7 (seven) days.    [provider]    Physical Exam: Vitals:   September 03, 2020 1600 09/03/2020 1715 09/03/2020 1800 2020/09/03 1812  BP: (!) 162/78 140/66 (!) 140/103   Pulse: 79 74 76   Resp: (!) 24 19 (!) 25   Temp:    (!) 94.8 F (34.9 C)  TempSrc:    Rectal  SpO2: 100% 96% 100%   Weight:      Height:         Vitals:  08/29/2020 1600 08/27/2020 1715 08/01/2020 1800 08/28/2020 1812  BP: (!) 162/78 140/66 (!) 140/103   Pulse: 79 74 76   Resp: (!) 24 19 (!) 25   Temp:    (!) 94.8 F (34.9 C)  TempSrc:    Rectal  SpO2: 100% 96% 100%   Weight:      Height:       General:  Emaciated elderly woman, confused, c/o hurting all over. Eyes: PERRL, lids and conjunctivae normal ENMT: Mucous membranes are dry. Normal dentition.  Neck: normal, supple, no masses, no thyromegaly Respiratory: Good air movement. NO increased WOB at exam. Prominent rales at left base, feint rales right.  Cardiovascular: Regular rate and rhythm, no murmurs / rubs / gallops. No extremity edema. 2+ pedal pulses. No carotid bruits.  Abdomen: scaphoid, no tenderness, no masses palpated. No hepatosplenomegaly. Bowel sounds positive.  Musculoskeletal: Extremely thin. no clubbing / cyanosis. No joint deformity upper and lower extremities except for interosseous wasting both hands.. Good ROM, no contractures. Normal muscle tone.  Skin: no rashes, lesions, ulcers. No induration. Fragil skin. Dry kerlex dressing on left calve - known healing ucler - not examined. Neurologic: CN  2-12 grossly intact. Strength 3/5 in all 4. HOH Psychiatric: Normal judgment and insight. Alert and oriented x 3. Normal mood.     Labs on Admission: I have personally reviewed following labs and imaging studies  CBC: Recent Labs  Lab 08/12/2020 1500  WBC 13.1*  HGB 7.1*  HCT 24.2*  MCV 100.4*  PLT [id number]   Basic Metabolic Panel: Recent Labs  Lab 08/15/2020 1500  NA 139  K 5.0  CL 97*  CO2 27  GLUCOSE 140*  BUN 46*  CREATININE 1.77*  CALCIUM 9.3   GFR: Estimated Creatinine Clearance: 13.1 mL/min (A) (by C-G formula based on SCr of 1.77 mg/dL (H)). Liver Function Tests: No results for input(s): AST, ALT, ALKPHOS, BILITOT, PROT, ALBUMIN in the last 168 hours. No results for input(s): LIPASE, AMYLASE in the last 168 hours. No results for input(s): AMMONIA in the last 168 hours. Coagulation Profile: Recent Labs  Lab 08/26/2020 1500  INR 1.0   Cardiac Enzymes: No results for input(s): CKTOTAL, CKMB, CKMBINDEX, TROPONINI in the last 168 hours. BNP (last 3 results) No results for input(s): PROBNP in the last 8760 hours. HbA1C: No results for input(s): HGBA1C in the last 72 hours. CBG: No results for input(s): GLUCAP in the last 168 hours. Lipid Profile: No results for input(s): CHOL, HDL, LDLCALC, TRIG, CHOLHDL, LDLDIRECT in the last 72 hours. Thyroid Function Tests: No results for input(s): TSH, T4TOTAL, FREET4, T3FREE, THYROIDAB in the last 72 hours. Anemia Panel: No results for input(s): VITAMINB12, FOLATE, FERRITIN, TIBC, IRON, RETICCTPCT in the last 72 hours. Urine analysis:    Component Value Date/Time   COLORURINE AMBER (A) 08/23/2020 1807   APPEARANCEUR TURBID (A) 08/06/2020 1807   LABSPEC 1.011 08/06/2020 1807   PHURINE 6.0 08/09/2020 1807   GLUCOSEU NEGATIVE 08/11/2020 1807   HGBUR SMALL (A) 08/07/2020 1807   BILIRUBINUR NEGATIVE 08/16/2020 [place] 08/07/2020 1807   PROTEINUR 100 (A) 08/01/2020 1807   NITRITE NEGATIVE 08/16/2020 1807    LEUKOCYTESUR MODERATE (A) 08/24/2020 1807    Radiological Exams on Admission: DG Chest Portable 1 View  Result Date: 08/28/2020 CLINICAL DATA:  Shortness of breath EXAM: PORTABLE CHEST 1 VIEW COMPARISON:  07/10/2020 FINDINGS: Bilateral interstitial thickening. Patchy alveolar opacities throughout the right lung. Small right pleural effusion. No pneumothorax. Stable cardiomegaly. No  acute osseous abnormality. IMPRESSION: Bilateral interstitial thickening and patchy alveolar opacities throughout the right lung and mild interstitial thickening of the left lung with cardiomegaly concerning for multilobar pneumonia versus asymmetric pulmonary edema. Electronically Signed   By: Kathreen Devoid   On: 08/10/2020 15:06    EKG: Independently reviewed. SR, LVH, no acute changes.  Assessment/Plan Active Problems:   Lobar pneumonia (HCC)   Sepsis ([place])   Atherosclerotic PVD with intermittent claudication - near occlusive SFA disease with moderate iliac disease   Essential hypertension   Chronic kidney disease (CKD), stage III (moderate) (HCC)   Hyperlipidemia with target low density lipoprotein (LDL) cholesterol less than 70 mg/dL   Protein-calorie malnutrition ([place])   CAP (community acquired pneumonia)    1. Multi-lobar pneumonia with sepsis - patient hypothermic on admission with tachypnea, AKI. Resuscitation initiated in ED: 1L LR, immediate abx - cefepime and Vanc. Patient with abnl CXR, CT chest pending.  Patient was supposed to be on oxygen at AL - new since d/c from hospital in Dec. PLan  med-surg admit ] O2 to keep sat >90%  Ceftriaxone 2 g q 24  Azithromycin 500 mg po daily x 5  IS during the day  BNP to r/o pulmonary edema  2. CKD IIIb - Cr trending upward since December.  r 1. 35 to 1.47 prior to discharge, now 1.77 - suspect dehydration exacerpation. Plan LR @ 100 cc/hr x 10 hrs then reduce rate to 50 cc/hr  3. HTN- per daughter her meds were held at [place] but she isn't sure why. In ED BP  143/103 Plan Resume home meds.   4. Protein-calorie malnutrition - patient emaciated. Plan  resume high protein supplement  5. Leg wound - this was a problem in December with sepsis.  Plan Wound care consult for continued treatment.  6. Code status - reviewed MOST with daughter: DNR/DNI, no ICU care, ok for IVF, no tube feeding, ok for abx.  7. Anemia - iron deficiency anemia with workup in December '21. Found to have PUD duodenum on EGD. Did have 2 u PRBCs fpr Hgb of 5.  Now with Hgb 7.1 down from 9.1 at discharge. No evidence of active bleeding. Will hold anticoagulants due to high risk of bleeding.  Plan Continue PPI orally bid  ADDENDUM - CT chest with bilateral pleural effusions, no infiltrates. BNP 1,474!  8. HFpEF - last echo April '09 w/ EF 55%. Now with elevated BNP and pleural effusions w/o infiltrate on CT chest. Plan Lasix 20 mg IV q6 x 3 doses  2 D echo - evaluate for HFpEF  Will continue treatment for PNA at this time.   DVT prophylaxis: SCDs ( Code Status: DNR/DNI, no tube feeds, no ICU care.  Family Communication: Spoke with daughter Mrs. Rierson - full update, confirmed MOST.  Disposition Plan: TBD  Consults called: none (with names) Admission status: inpatient    Adella Hare MD [facility] Pager [phone]  If 7PM-7AM, please contact night-coverage [email or web address] Password [facility]  08/16/2020, 6:57 PM

## 2020-08-21 NOTE — Progress Notes (Signed)
Pharmacy Antibiotic Note  Kristina [name] is a 87 y.o. female admitted on 08/29/2020 with pneumonia.  Pharmacy has been consulted for vancomycin dosing.    Plan: Vancomycin 1000 mg IV x 1, then 500 mg IV every 48h (Est AUC 531, Goal AUC 400-550, SCr 1.77) Add MRSA PCR Monitor renal function, Cx/PCR and clinical progression to narrow Vancomycin levels as needed  Height: 4\' 10"  (147.3 cm) Weight: 42 kg (92 lb 9.5 oz) IBW/kg (Calculated) : 40.9  Temp (24hrs), Avg:94.4 F (34.7 C), Min:94.4 F (34.7 C), Max:94.4 F (34.7 C)  No results for input(s): WBC, CREATININE, LATICACIDVEN, VANCOTROUGH, VANCOPEAK, VANCORANDOM, GENTTROUGH, GENTPEAK, GENTRANDOM, TOBRATROUGH, TOBRAPEAK, TOBRARND, AMIKACINPEAK, AMIKACINTROU, AMIKACIN in the last 168 hours.  CrCl cannot be calculated (Patient's most recent lab result is older than the maximum 21 days allowed.).    Allergies  Allergen Reactions  . Lipitor [Atorvastatin] Other (See Comments)    Reaction:  Unknown   . Pletal [Cilostazol] Other (See Comments)    Reaction:  Unknown   . Simvastatin     MYALGIAS    Bertis Ruddy, PharmD Clinical Pharmacist ED Pharmacist Phone # [phone] 08/19/2020 3:06 PM

## 2020-08-21 NOTE — Progress Notes (Signed)
[facility] is following this Code Sepsis. 

## 2020-08-22 ENCOUNTER — Other Ambulatory Visit (HOSPITAL_COMMUNITY): Payer: Medicare Other

## 2020-08-22 DIAGNOSIS — A419 Sepsis, unspecified organism: Secondary | ICD-10-CM | POA: Diagnosis not present

## 2020-08-22 DIAGNOSIS — E43 Unspecified severe protein-calorie malnutrition: Secondary | ICD-10-CM

## 2020-08-22 DIAGNOSIS — I70219 Atherosclerosis of native arteries of extremities with intermittent claudication, unspecified extremity: Secondary | ICD-10-CM

## 2020-08-22 DIAGNOSIS — R6520 Severe sepsis without septic shock: Secondary | ICD-10-CM | POA: Diagnosis present

## 2020-08-22 DIAGNOSIS — J189 Pneumonia, unspecified organism: Secondary | ICD-10-CM

## 2020-08-22 LAB — CBC WITH DIFFERENTIAL/PLATELET
Abs Immature Granulocytes: 0.2 10*3/uL — ABNORMAL HIGH (ref 0.00–0.07)
Basophils Absolute: 0 10*3/uL (ref 0.0–0.1)
Basophils Relative: 0 %
Eosinophils Absolute: 0 10*3/uL (ref 0.0–0.5)
Eosinophils Relative: 0 %
HCT: 21.3 % — ABNORMAL LOW (ref 36.0–46.0)
Hemoglobin: 6.3 g/dL — CL (ref 12.0–15.0)
Immature Granulocytes: 1 %
Lymphocytes Relative: 2 %
Lymphs Abs: 0.6 10*3/uL — ABNORMAL LOW (ref 0.7–4.0)
MCH: 29.6 pg (ref 26.0–34.0)
MCHC: 29.6 g/dL — ABNORMAL LOW (ref 30.0–36.0)
MCV: 100 fL (ref 80.0–100.0)
Monocytes Absolute: 1 10*3/uL (ref 0.1–1.0)
Monocytes Relative: 4 %
Neutro Abs: 22.5 10*3/uL — ABNORMAL HIGH (ref 1.7–7.7)
Neutrophils Relative %: 93 %
Platelets: 450 10*3/uL — ABNORMAL HIGH (ref 150–400)
RBC: 2.13 MIL/uL — ABNORMAL LOW (ref 3.87–5.11)
RDW: 18.5 % — ABNORMAL HIGH (ref 11.5–15.5)
WBC: 24.3 10*3/uL — ABNORMAL HIGH (ref 4.0–10.5)
nRBC: 0.2 % (ref 0.0–0.2)

## 2020-08-22 LAB — BASIC METABOLIC PANEL
Anion gap: 13 (ref 5–15)
BUN: 47 mg/dL — ABNORMAL HIGH (ref 8–23)
CO2: 24 mmol/L (ref 22–32)
Calcium: 8.7 mg/dL — ABNORMAL LOW (ref 8.9–10.3)
Chloride: 99 mmol/L (ref 98–111)
Creatinine, Ser: 2.18 mg/dL — ABNORMAL HIGH (ref 0.44–1.00)
GFR, Estimated: 21 mL/min — ABNORMAL LOW (ref >60)
Glucose, Bld: 107 mg/dL — ABNORMAL HIGH (ref 70–99)
Potassium: 6 mmol/L — ABNORMAL HIGH (ref 3.5–5.1)
Sodium: 136 mmol/L (ref 135–145)

## 2020-08-22 LAB — HEMOGLOBIN AND HEMATOCRIT, BLOOD
HCT: 33.9 % — ABNORMAL LOW (ref 36.0–46.0)
HCT: 33.7 % — ABNORMAL LOW (ref 36.0–46.0)
HCT: 33.3 % — ABNORMAL LOW (ref 36.0–46.0)
Hemoglobin: 10.4 g/dL — ABNORMAL LOW (ref 12.0–15.0)
Hemoglobin: 10.5 g/dL — ABNORMAL LOW (ref 12.0–15.0)
Hemoglobin: 10.6 g/dL — ABNORMAL LOW (ref 12.0–15.0)

## 2020-08-22 LAB — PROTIME-INR
INR: 1.3 — ABNORMAL HIGH (ref 0.8–1.2)
Prothrombin Time: 15.3 seconds — ABNORMAL HIGH (ref 11.4–15.2)

## 2020-08-22 LAB — MRSA PCR SCREENING: MRSA by PCR: NEGATIVE

## 2020-08-22 LAB — RETICULOCYTES
Immature Retic Fract: 19 % — ABNORMAL HIGH (ref 2.3–15.9)
RBC.: 3.72 MIL/uL — ABNORMAL LOW (ref 3.87–5.11)
Retic Count, Absolute: 119.8 10*3/uL (ref 19.0–186.0)
Retic Ct Pct: 3.2 % — ABNORMAL HIGH (ref 0.4–3.1)

## 2020-08-22 LAB — APTT: aPTT: 36 seconds (ref 24–36)

## 2020-08-22 LAB — FERRITIN: Ferritin: 91 ng/mL (ref 11–307)

## 2020-08-22 LAB — PREPARE RBC (CROSSMATCH)

## 2020-08-22 LAB — FOLATE: Folate: 16.1 ng/mL (ref >5.9)

## 2020-08-22 LAB — EXPECTORATED SPUTUM ASSESSMENT W GRAM STAIN, RFLX TO RESP C

## 2020-08-22 LAB — IRON AND TIBC
Iron: 50 ug/dL (ref 28–170)
Saturation Ratios: 16 % (ref 10.4–31.8)
TIBC: 318 ug/dL (ref 250–450)
UIBC: 268 ug/dL

## 2020-08-22 LAB — VITAMIN B12: Vitamin B-12: 599 pg/mL (ref 180–914)

## 2020-08-22 MED ORDER — SODIUM CHLORIDE 0.9% IV SOLUTION: Freq: Once | INTRAVENOUS | Status: AC

## 2020-08-22 MED ORDER — HYDRALAZINE HCL 25 MG PO TABS: 25.0000 mg | ORAL_TABLET | Freq: Four times a day (QID) | ORAL | Status: DC | PRN

## 2020-08-22 MED ORDER — SODIUM CHLORIDE 0.9 % IV BOLUS
1000.0000 mL | Freq: Once | INTRAVENOUS | Status: AC
  Administered 2020-08-22: 1000 mL via INTRAVENOUS

## 2020-08-22 MED ORDER — IPRATROPIUM-ALBUTEROL 0.5-2.5 (3) MG/3ML IN SOLN: 3.0000 mL | Freq: Four times a day (QID) | RESPIRATORY_TRACT | Status: DC | PRN

## 2020-08-22 MED ORDER — PANTOPRAZOLE SODIUM 40 MG IV SOLR
40.0000 mg | Freq: Two times a day (BID) | INTRAVENOUS | Status: DC
  Administered 2020-08-22 – 2020-08-24 (×5): 40 mg via INTRAVENOUS
  Filled 2020-08-22 (×6): qty 40

## 2020-08-22 MED ORDER — DIPHENHYDRAMINE HCL 25 MG PO CAPS: 25.0000 mg | ORAL_CAPSULE | Freq: Once | ORAL | Status: DC

## 2020-08-22 MED ORDER — NOREPINEPHRINE 4 MG/250ML-% IV SOLN: 0.0000 ug/min | INTRAVENOUS | Status: DC

## 2020-08-22 MED ORDER — FERROUS SULFATE 325 (65 FE) MG PO TABS
325.0000 mg | ORAL_TABLET | Freq: Every day | ORAL | Status: DC
  Administered 2020-08-23 – 2020-08-24 (×2): 325 mg via ORAL
  Filled 2020-08-22 (×3): qty 1

## 2020-08-22 MED ORDER — ACETAMINOPHEN 325 MG PO TABS: 650.0000 mg | ORAL_TABLET | Freq: Once | ORAL | Status: DC

## 2020-08-22 NOTE — Consult Note (Signed)
NAME:  Kristina [name], MRN:  [id number], DOB:  March 09, 1926, LOS: 1 ADMISSION DATE:  08/18/2020, CONSULTATION DATE:  08/22/20 REFERRING MD:  Sidney Ace, CHIEF COMPLAINT:  Chest pain and hypoxemia   Brief History:  87 y/o female with multiple medical problems brought to the emergency department from assisted living on August 21, 2020 after she was found with her oxygen off, O2 saturation in the 70s.  In the emergency department she was found to have evidence of septic shock, pneumonia and a urinary tract infection.  Also had multiorgan failure with acute kidney injury, anemia.  Pulmonary and critical care medicine was consulted initially for hypotension but at the time of pulmonary and critical care medicine assessment the blood pressure derangements had resolved with packed red blood cell transfusion.  History of Present Illness:   87 y/o female with multiple medical problems brought to the emergency department from assisted living on August 21, 2020 after she was found with her oxygen off, O2 saturation in the 70s.  In the emergency department she was found to have evidence of septic shock, pneumonia and a urinary tract infection.  Also had multiorgan failure with acute kidney injury, anemia.    She has been admitted by the hospitalist service who have administered IV antibiotics, IV fluids, and packed red blood cells initially, but then later ordered furosemide as well as resumed home medications including hydralazine.  Later after several hours in the emergency department she developed persistent hypotension.     The patient is awake and alert and tells me that she has some pain "all over".  She is oriented and asking me if it still snowing outside.  She says that she is currently not short of breath.  She says that she has been feeling a bit better since coming to the hospital.   In reviewing the H&P from admission Dr. Linda Hedges reviewed [employer] with the patient's daughter it says quite clearly that  the patient was not to receive ICU care and her code status was DNR.  Past Medical History:   Past Medical History:  Diagnosis Date  . AAA (abdominal aortic aneurysm) (HCC)    Mild - measuring 2.8x2.8cm  . Acute hyponatremia 11/2016  . 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 HOH (hard of hearing)   . Hyperlipidemia    Well-controlled  . Hypertension   . PAD (peripheral artery disease) (HCC)    LEA Dopplers: Right external iliac 40%, right mid SFA 70-99% with occlusion in Hunter's canal. Angiography. LEFT common iliac 60%, left SFA 70-9%, popliteal 50%; RIGHT posterior tibial occluded;; carotid Dopplers in 2009: Less than 50% stenosis.  . Renal artery stenosis ([place])      Significant Hospital Events:    Consults:  PCCM  Procedures:    Significant Diagnostic Tests:    Micro Data:  1/22 sputum >  1/22 blood >   Antimicrobials:  1/21 vanc >  1/21 ceftriaxone >  1/21 azithro >   Interim History / Subjective:  As above  Objective   Blood pressure 96/62, pulse (!) 106, temperature 97.8 F (36.6 C), temperature source Oral, resp. rate (!) 23, height 4\' 10"  (1.473 m), weight 42 kg, SpO2 (!) 75 %.        Intake/Output Summary (Last 24 hours) at 08/22/2020 0309 Last data filed at 08/22/2020 0002 Gross per 24 hour  Intake 2050 ml  Output 400  ml  Net 1650 ml   Filed Weights   08/02/2020 1429  Weight: 42 kg    Examination:  General:  Frail, elderly female resting comfortably in bed HENT: NCAT OP clear PULM: Crackles r base, clear on left B, normal effort CV: RRR, S1,S2 noted GI: BS+, soft, mildly tender to palpation but soft MSK: normal bulk and tone Derm: bilateral ankle edema noted, thin skin Neuro: awake, oriented to situation, place, moves all four extremities  Resolved Hospital Problem list     Assessment & Plan:  Pneumonia Right pleural effusion Chronic respiratory  failure with hypoxemia Hypotension after receiving blood pressure medicines and diuretics Frail Protein calorie malnutrition Chronic leg wound DNR status  Discussion: This is a sad situation of a 87 year old female with multiorgan failure in the setting of probable sepsis related to a urinary tract infection and a right lower lobe pneumonia.  The patient also seems to have evidence of pulmonary arterial hypertension/heart failure exacerbation which may very well be a cor pulmonale type situation.  Her lactic acid is elevated in the setting of hypoxemia and shock, her kidney function is worse in the setting of 1 solitary kidney and baseline chronic kidney disease and current shock.  All of this is made worse by anemia.  The patient's daughter has stated clearly that she did not want for the patient to receive ICU level care as this is documented by Dr. Linda Hedges in his admission note.  Further, given the patient's advanced age, severe multiorgan failure, and overall dismal prognosis she would not benefit from ICU level care.  Plan: Admission per hospitalist I will cancel the order for norepinephrine Continue transfusion of ordered blood products Continue antibiotics as directed by the primary service Would advise further decisions in regards to management of the patient's blood pressure be guided by physical exam given complex hemodynamic derangement with pulmonary hypotension as well as sepsis picture. If this patient declines further would recommend comfort measures rather than ICU transfer as her chances of surviving a critical illness is near 0.  PCCM will signo ff  Best practice (evaluated daily)   Per [facility]  Goals of Care:   Per [facility]  Labs   CBC: Recent Labs  Lab 08/06/2020 1500 08/22/20 0209  WBC 13.1* 24.3*  NEUTROABS  --  22.5*  HGB 7.1* 6.3*  HCT 24.2* 21.3*  MCV 100.4* 100.0  PLT 563* 450*    Basic Metabolic Panel: Recent Labs  Lab 08/27/2020 1500  NA 139  K 5.0  CL  97*  CO2 27  GLUCOSE 140*  BUN 46*  CREATININE 1.77*  CALCIUM 9.3   GFR: Estimated Creatinine Clearance: 13.1 mL/min (A) (by C-G formula based on SCr of 1.77 mg/dL (H)). Recent Labs  Lab 08/26/2020 1500 08/12/2020 1715 08/22/20 0209  PROCALCITON  --  0.15  --   WBC 13.1*  --  24.3*  LATICACIDVEN 2.9* 2.8*  --     Liver Function Tests: Recent Labs  Lab 08/20/2020 1715  AST 135*  ALT 119*  ALKPHOS 118  BILITOT 0.5  PROT 6.3*  ALBUMIN 3.0*   No results for input(s): LIPASE, AMYLASE in the last 168 hours. No results for input(s): AMMONIA in the last 168 hours.  ABG No results found for: PHART, PCO2ART, PO2ART, HCO3, TCO2, ACIDBASEDEF, O2SAT   Coagulation Profile: Recent Labs  Lab 08/15/2020 1500  INR 1.0    Cardiac Enzymes: No results for input(s): CKTOTAL, CKMB, CKMBINDEX, TROPONINI in the last 168 hours.  HbA1C:  No results found for: HGBA1C  CBG: Recent Labs  Lab 08/02/2020 2250  GLUCAP 79    Review of Systems:   Gen: per HPI HEENT: Denies blurred vision, double vision, hearing loss, tinnitus, sinus congestion, rhinorrhea, sore throat, neck stiffness, dysphagia PULM: per HPI CV: notes some chest pain, denies edema, orthopnea, paroxysmal nocturnal dyspnea, palpitations GI: notes some abdominal pain, denies nausea, vomiting, diarrhea, hematochezia, melena, constipation, change in bowel habits GU: Denies dysuria, hematuria, polyuria, oliguria, urethral discharge Endocrine: Denies hot or cold intolerance, polyuria, polyphagia or appetite change Derm: Denies rash, dry skin, scaling or peeling skin change Heme: Denies easy bruising, bleeding, bleeding gums Neuro: Denies headache, numbness, weakness, slurred speech, loss of memory or consciousness   Past Medical History:  She,  has a past medical history of AAA (abdominal aortic aneurysm) ([place]), Acute hyponatremia (11/2016), Chronic kidney disease (CKD), stage III (moderate) (HCC), HOH (hard of hearing),  Hyperlipidemia, Hypertension, PAD (peripheral artery disease) ([place]), and Renal artery stenosis ([place]).   Surgical History:   Past Surgical History:  Procedure Laterality Date  . BIOPSY  07/07/2020   Procedure: BIOPSY;  Surgeon: Carol Ada, MD;  Location: [facility] ENDOSCOPY;  Service: Endoscopy;;  . ESOPHAGOGASTRODUODENOSCOPY Left 07/07/2020   Procedure: ESOPHAGOGASTRODUODENOSCOPY (EGD);  Surgeon: Carol Ada, MD;  Location: [place];  Service: Endoscopy;  Laterality: Left;  . INTRAMEDULLARY (IM) NAIL INTERTROCHANTERIC Left 08/18/2017   Procedure: INTRAMEDULLARY (IM) NAIL INTERTROCHANTRIC;  Surgeon: Leandrew Koyanagi, MD;  Location: [facility];  Service: Orthopedics;  Laterality: Left;  . LOWER EXTREMITY ARTERIAL DOPPLER  05/30/2011   Right EIA->50% daimeter reduction, Rt SFA 70-99% diameter reduction, Rt SFA-occlusive disease at Hunter's canal, Rt PTA-appeared occluded, Lft CIA-moderate amount of calcific plaque w/ >60% diameter reduction, Lft prox SFA 70-99% diameter reduction, Lft POP-large amount of irregular mixed suggesting <50% diameter reduction  . NM MYOVIEW LTD  06/25/2008   No scintigraphic evidence of inducible myocardial ischemia  . PERIPHERAL VASCULAR ANGIOGRAM  07/13/2009   No intervention - "plan on doing staged intervention in near future"  . TRANSTHORACIC ECHOCARDIOGRAM  11/14/2007   EF >55%, mild mitral and tricuspid regurg.     Social History:   reports that she quit smoking about 19 years ago. Her smoking use included cigarettes. She has never used smokeless tobacco. She reports current alcohol use of about 1.0 standard drink of alcohol per week. She reports that she does not use drugs.   Family History:  Her family history includes Cancer in her brother; Heart attack in her brother.   Allergies Allergies  Allergen Reactions  . Lipitor [Atorvastatin] Other (See Comments)    Reaction:  Unknown   . Pletal [Cilostazol] Other (See Comments)    Reaction:  Unknown   .  Simvastatin     MYALGIAS     Home Medications  Prior to Admission medications   Medication Sig Start Date End Date Taking? Authorizing Provider  acetaminophen (TYLENOL) 500 MG tablet Take 500 mg by mouth every 6 (six) hours as needed for mild pain.   Yes [provider]  albuterol (VENTOLIN HFA) 108 (90 Base) MCG/ACT inhaler Inhale 2 puffs into the lungs every 6 (six) hours as needed for wheezing or shortness of breath.   Yes [provider]  aspirin 81 MG tablet Take 81 mg by mouth daily.   Yes [provider]  Coenzyme Q10 (COQ10) 100 MG CAPS Take 100 mg by mouth 3 (three) times daily with meals. 07/17/17  Yes Leonie Man, MD  ferrous sulfate 325 (65 FE) MG tablet Take 325 mg by mouth daily with breakfast.   Yes [provider]  gabapentin (NEURONTIN) 100 MG capsule Take 100 mg by mouth daily.   Yes [provider]  hydrALAZINE (APRESOLINE) 25 MG tablet Take 1 tablet (25 mg total) by mouth 3 (three) times daily. Patient taking differently: Take 25 mg by mouth in the morning and at bedtime. 07/13/20  Yes Pokhrel, Laxman, MD  hydrOXYzine (ATARAX/VISTARIL) 25 MG tablet Take 25 mg by mouth in the morning and at bedtime.   Yes [provider]  pantoprazole (PROTONIX) 40 MG tablet Take 1 tablet (40 mg total) by mouth 2 (two) times daily for 30 days, THEN 1 tablet (40 mg total) daily. 07/13/20 09/11/20 Yes Pokhrel, Laxman, MD  polyethylene glycol (MIRALAX / GLYCOLAX) packet Take 17 g by mouth daily.   Yes [provider]  Probiotic Product (ALIGN) 4 MG CAPS Take 4 mg by mouth daily after lunch.    Yes [provider]  Vitamin D, Ergocalciferol, (DRISDOL) 1.25 MG (50000 UNIT) CAPS capsule Take 50,000 Units by mouth every 7 (seven) days.   Yes [provider]     Critical care time: n/a    Roselie Awkward, MD [place] PCCM Pager: [phone] Cell: [phone] If no response, call [phone]

## 2020-08-22 NOTE — Evaluation (Signed)
Physical Therapy Evaluation Patient Details Name: Kristina [name] MRN: [id number] DOB: 1926/07/15 Today's Date: 08/22/2020   History of Present Illness  Kristina [name] is a 87 y.o. female with pertinent past medical history of AAA, CKD stage III, hyperlipidemia, hypertension, PAD presents the emergency department today for shortness of breath and chest pain via EMS. Patient was found to have pneumonia and sepsis.  Clinical Impression  PTA, patient was at SNF and unsure of required assistance per daughter. Per daughter, SNF states "this is her new norm" as patient has not walked since d/c'ed to SNF in 07/2020, however states was walking prior to last admission. Patient minA for supine>sit and supervision for sit>supine. Deferred OOB mobility due to dizziness sitting EOB and decreased alertness for <30 seconds. Patient motivated and willing to work. Patient presents with decreased activity tolerance, generalized weakness, impaired balance, and impaired functional mobility. Patient will benefit from skilled PT services during acute stay to address listed deficits. Recommend SNF following discharge to maximize functional mobility.      Follow Up Recommendations SNF;Supervision/Assistance - 24 hour    Equipment Recommendations  Other (comment) (defer to post acute rehab)    Recommendations for Other Services       Precautions / Restrictions Precautions Precautions: Fall Precaution Comments: watch O2, maybe check orthostatics Restrictions Weight Bearing Restrictions: No      Mobility  Bed Mobility Overal bed mobility: Needs Assistance Bed Mobility: Supine to Sit;Sit to Supine     Supine to sit: Min assist Sit to supine: Supervision   General bed mobility comments: minA to bring trunk to EOB and reposition hips towards EOB. Patient able to perform sit>supine with no physical assist    Transfers                 General transfer comment: deferred due to dizziness EOB and  patient with decreased alertness for ~30 seconds  Ambulation/Gait                Stairs            Wheelchair Mobility    Modified Rankin (Stroke Patients Only)       Balance Overall balance assessment: Needs assistance Sitting-balance support: Bilateral upper extremity supported;Feet unsupported Sitting balance-Leahy Scale: Fair Sitting balance - Comments: able to sit EOB with close supervision                                     Pertinent Vitals/Pain Pain Assessment: Faces Faces Pain Scale: Hurts little more Pain Location: neck Pain Descriptors / Indicators: Discomfort Pain Intervention(s): Monitored during session;Repositioned    Home Living Family/patient expects to be discharged to:: Skilled nursing facility                      Prior Function Level of Independence: Needs assistance         Comments: Daughter and patient unable to state amount of assistance needed, however patient has not been walking since 07/2020 when d/c'ed to SNF. Patient was walking prior to being sent to SNF. Per daughter, SNF staff reported "this was her new norm".     Hand Dominance        Extremity/Trunk Assessment   Upper Extremity Assessment Upper Extremity Assessment: Defer to OT evaluation    Lower Extremity Assessment Lower Extremity Assessment: Generalized weakness (B LE grossly 4/5, unable to test knee flexion/extension on  LLE due to wounds and pain)       Communication      Cognition Arousal/Alertness: Awake/alert Behavior During Therapy: WFL for tasks assessed/performed Overall Cognitive Status: Within Functional Limits for tasks assessed                                        General Comments General comments (skin integrity, edema, etc.): Daughter present throughout session. Patient willing to try anything and willing to be pushed to get better and walk. Patient on 2L O2 [place] on arrival with resting spO2 97%.  With activity, spO2 dropped to 89%, however unsure if reliable as not a good pleth reading    Exercises     Assessment/Plan    PT Assessment Patient needs continued PT services  PT Problem List Decreased strength;Decreased range of motion;Decreased activity tolerance;Decreased balance;Decreased mobility;Cardiopulmonary status limiting activity       PT Treatment Interventions DME instruction;Gait training;Functional mobility training;Therapeutic activities;Balance training;Therapeutic exercise;Patient/family education    PT Goals (Current goals can be found in the Care Plan section)  Acute Rehab PT Goals Patient Stated Goal: to get better PT Goal Formulation: With patient/family Time For Goal Achievement: 09/05/20 Potential to Achieve Goals: Fair    Frequency Min 2X/week   Barriers to discharge        Co-evaluation               AM-PAC PT "6 Clicks" Mobility  Outcome Measure Help needed turning from your back to your side while in a flat bed without using bedrails?: A Little Help needed moving from lying on your back to sitting on the side of a flat bed without using bedrails?: A Little Help needed moving to and from a bed to a chair (including a wheelchair)?: A Little Help needed standing up from a chair using your arms (e.g., wheelchair or bedside chair)?: A Little Help needed to walk in hospital room?: A Lot Help needed climbing 3-5 steps with a railing? : A Lot 6 Click Score: 16    End of Session Equipment Utilized During Treatment: Oxygen Activity Tolerance: Patient tolerated treatment well Patient left: in bed;with call bell/phone within reach;with bed alarm set;with family/visitor present Nurse Communication: Mobility status PT Visit Diagnosis: Unsteadiness on feet (R26.81);Other abnormalities of gait and mobility (R26.89);Muscle weakness (generalized) (M62.81)    Time: 5284-1324 PT Time Calculation (min) (ACUTE ONLY): 31 min   Charges:   PT  Evaluation $PT Eval Moderate Complexity: 1 Mod PT Treatments $Therapeutic Activity: 8-22 mins        Matsue Strom A. Gilford Rile PT, DPT Acute Rehabilitation Services Pager [phone] Office [phone]   Alda Lea 08/22/2020, 5:36 PM

## 2020-08-22 NOTE — ED Notes (Signed)
Oral temp of 98.5.  Bair hugger Dc'd

## 2020-08-22 NOTE — ED Notes (Signed)
CB collected prior to blood admin but clicked off after.

## 2020-08-22 NOTE — Plan of Care (Signed)
  Problem: Education: Goal: Knowledge of General Education information will improve Description: Including pain rating scale, medication(s)/side effects and non-pharmacologic comfort measures Outcome: Progressing   Problem: Health Behavior/Discharge Planning: Goal: Ability to manage health-related needs will improve Outcome: Progressing   Problem: Activity: Goal: Risk for activity intolerance will decrease Outcome: Progressing   Problem: Elimination: Goal: Will not experience complications related to bowel motility Outcome: Progressing   Problem: Pain Managment: Goal: General experience of comfort will improve Outcome: Progressing   

## 2020-08-22 NOTE — ED Notes (Signed)
Notified Dr. Sidney Ace that SBP is continuing to drop with most recent at 75 SBP on monitor and 78-80 SBP manual.

## 2020-08-22 NOTE — Consult Note (Signed)
WOC Nurse Consult Note: Reason for Consult:Patient with recently healed full thickness wound on left anterior LE with dried and flaking skin noted in the gaiter area. Most likely traumatic wounds vs arterial wound Wound type:Healed traumatic wound, full thickness Pressure Injury POA: N/A Measurement: N/A Wound bed:N/A and as described above Drainage (amount, consistency, odor) None Periwound:As noted above with dried skin and plaques Dressing procedure/placement/frequency: Patient will have local wound care provided to the left pretibial area with a soap and water cleanse, rinse and application of an antimicrobial nonadherent gauze (xeroform) placed over the area. This will be covered with an ABD pad and secured with a few turns of Kerlix roll gauze/paper tape. Turning and repositioning are in place and a silicone foam dressing will be used to cover the sacral protuberance for pressure injury prevention. Bilateral heel boots are provided for pressure injury prevention as well.   [place] nursing team will not follow, but will remain available to this patient, the nursing and medical teams.  Please re-consult if needed. Thanks, Maudie Flakes, MSN, RN, [place], Arther Abbott  Pager# [phone]

## 2020-08-22 NOTE — Progress Notes (Signed)
The patient has been hypotensive this evening.  Systolic blood pressure was 78-80 manual and she was given 250 mL IV lactated Ringer bolus followed by 1 L bolus of IV normal saline blood pressure initially improved to 90/51 and later 98/52.  Her hemoglobin has been down to 7.1 yesterday and repeat CBC showed hemoglobin of 6.3.  She was ordered 2 units of packed red blood cells.  After her fluid boluses blood pressure still trended down to the mid 44Y systolic with latest of 18/56.  Given her multilobar pneumonia with sepsis and suspected septic shock and as she has received close to 30 mL/KG, it was decided to start her on IV pressors with Levophed and get her admitted to the ICU.  I discussed the case with Dr. Lake Bells who accepted the patient and will see her.  She has been complaining of abdominal pain with tenderness and would benefit from abdominal CT scan when her blood pressure is stabilized.

## 2020-08-22 NOTE — ED Notes (Signed)
Report was given to [facility]

## 2020-08-22 NOTE — Progress Notes (Signed)
PROGRESS NOTE    Kristina [name]  ZOX:[id number] DOB: 08/12/1925 DOA: 08/01/2020 PCP: Holland Commons, FNP    Brief Narrative:  Kristina [name] is a 87 year old female with past medical history significant for CKD stage IIIb, solitary kidney, hyperlipidemia, essential hypertension, peripheral artery disease, iron deficiency anemia, peptic ulcer disease, AAA, chronic hypoxic respiratory failure on 5L [place] baseline who presented to the ED via EMS from [place] with complaints of shortness of breath and chest pain.  Upon EMS arrival, patient was found to be hypoxic with SPO2 in the 70s due to apparent oxygen machine malfunction.  Patient complained of sharp chest pain, 9/10 and was administered 1 nitroglycerin tablet and aspirin 324 mg orally.  Patient stated that onset of symptoms, roughly 2 days ago with mild nonproductive cough.  She denies any fevers, chills, night sweats, no nausea/vomiting/diarrhea, no abdominal pain.  Denies any recent sick contacts.  Patient has been vaccinated against COVID-19.    In the ED temperature 94.4 F, HR 76, RR 25, BP 159/90, SPO2 100% on 4 L nasal cannula.  Sodium 139, potassium 5.0, chloride 97, CO2 27, glucose 140, BUN 46, creatinine 1.77, BNP 752.6, troponin 372>477.  Lactic acid 2.9.  BBC 13.1, hemoglobin 7.1, MCV 100.4, platelets 563.  INR 1.0. SARS-CoV-2/COVID-19 PCR negative.  Chest x-ray with bilateral interstitial thickening and patchy alveolar opacities throughout right lung and mid interstitial thickening of the left lung with cardiomegaly concerning for multilobar pneumonia versus asymmetric pulmonary edema.  CT chest without contrast with moderate right and small left pleural effusions with associated atelectasis.  Patient was given 1 L LR bolus, started on vancomycin and cefepime.  Hospitalist service consulted for further evaluation and management.  During night of admission, patient started developing hypotension despite aggressive  resuscitation with IV fluids at 30 mL/kg.  PCCM was consulted for possible transfer to ICU for vasopressor initiation.  Patient was also noted to have a drop in her hemoglobin from 7.1-6.3 and was transfused 2 unit PRBC.  Patient was evaluated by Dr. Lake Bells, who noted that her blood pressure improved with blood transfusion and further discussion with family that patient would not want aggressive measures such as CPR or intubation and that patient was stable to remain on the hospitalist service.   Assessment & Plan:   Active Problems:   Atherosclerotic PVD with intermittent claudication - near occlusive SFA disease with moderate iliac disease   Essential hypertension   Hyperlipidemia with target low density lipoprotein (LDL) cholesterol less than 70 mg/dL   Chronic kidney disease (CKD), stage III (moderate) (HCC)   Protein-calorie malnutrition (HCC)   Lobar pneumonia (HCC)   Sepsis (HCC)   CAP (community acquired pneumonia)   Septic shock ([place])   Severe sepsis (HCC)   Severe sepsis, present on admission Community-acquired pneumonia Urinary tract infection Patient presenting from nursing facility with shortness of breath, cough.  Patient hypothermic with temperature 94.4 F and elevated WBC count of 13.1 on admission.  Urinalysis with moderate leukoesterase, negative nitrite, many bacteria and greater than 50 WBCs.  Lactic acid elevated 2.9 with procalcitonin 0.15.  Chest x-ray with findings consistent with multilobar pneumonia.  COVID-19 PCR negative. --WBC 13.1>24.3 --Blood cultures x2: Pending --Sputum culture: Pending --Urine culture: Pending --Azithromycin 500 mg IV every 24 hours --Ceftriaxone 2 g IV every 24 hours --Vancomycin, check MRSA PCR; if negative will discontinue --Follow CBC daily  Acute on chronic hypoxic respiratory failure Patient was found by EMS with SPO2 in the  70s, at baseline on 5 L nasal cannula.  Apparently oxygen machine at SNF malfunctioning. --Currently  on 3 L nasal cannula with SPO2 95%.  Symptomatic anemia Hx iron deficiency anemia Hx of peptic ulcer disease Patient presenting with hemoglobin 7.1.  Baseline 8.0-9.1. Hx of peptic ulcer disease, follows with GI, Dr. Benson Norway with EGD 07/07/2020 with few cratered nonbleeding duodenal ulcers.  Anemia panel with iron 50, TIBC 318, ferritin 91, B12 591, folate 16.1. --Hgb 7.1>6.3>10.4 --s/p 2u pRBC on 1/22 --Holding home aspirin --Continue home ferrous sulfate 325 mg p.o. daily --FOBT pending --Protonix 40 mg IV every 12 hours --continue to monitor H/H q6h  Acute renal failure on CKD stage IIIb Baseline creatinine 1.2-1.4.  Patient with solitary kidney.  Creatinine 1.77 on admission.  Etiology likely secondary to prerenal azotemia from dehydration versus ATN from sepsis and hypotension. --Cr 1.77>2.18 --Hold home antihypertensives, renally dose all medications --Treatment for sepsis as above --Strict I's and O's --Follow BMP daily  Elevated troponin likely secondary to type II demand ischemia Patient presenting with chest pain in the setting of hypoxia as her oxygen was malfunctioning at her SNF as well as anemia.  Troponin elevated at 372>477 on ED arrival.  With NSR, rate 80, QTc 439, no concerning ST elevation/depression or T wave inversions. --Currently chest pain-free --Continue to monitor on telemetry --TTE pending  Essential hypertension Chronic diastolic congestive heart failure TTE April 2009 with LVEF 55%. --Repeat TTE: Pending --Holding home hydralazine --Strict I's and O's and daily weights --Monitor BP closely  Left anterior shin wound, POA --Wound care consultation for evaluation  Severe protein calorie malnutrition Body mass index is 19.35 kg/m.  Patient very thin, cachectic emaciated in appearance -- Nutrition consult for recommendations and supplementation   DVT prophylaxis: SCDs, holding chemical DVT prophylaxis due to profound anemia, concerning for GI bleed    Code Status: DNR Family Communication: Attempted to update patient's daughter,   Disposition Plan:  Level of care: Telemetry Medical Status is: Inpatient  Remains inpatient appropriate because:Hemodynamically unstable, Altered mental status, Ongoing diagnostic testing needed not appropriate for outpatient work up, Unsafe d/c plan, IV treatments appropriate due to intensity of illness or inability to take PO and Inpatient level of care appropriate due to severity of illness   Dispo: The patient is from: SNF              Anticipated d/c is to: SNF              Anticipated d/c date is: 3 days              Patient currently is not medically stable to d/c.   Difficult to place patient No   Consultants:   PCCM - signed off 1/22  Procedures:   TTE  Antimicrobials:   Vancomycin 1/21>>  Azithromycin 1/21>>  Ceftriaxone 1/21>>  Cefepime 1/21 - 1/21    Subjective: Patient seen and examined at bedside, continues in ED holding area.  Continues with mild cough, weakness and fatigue.  No other specific complaints at this time.  Concerned about her left shin wound which she apparently scraped with her toenail.  Headache, no visual changes, no current chest pain, no palpitations, no abdominal pain, no nausea/vomiting/diarrhea, no current fever/chills/night sweats.  Objective: Vitals:   08/22/20 0915 08/22/20 0930 08/22/20 0945 08/22/20 1215  BP: 120/72 117/60 94/63 112/65  Pulse: 70 69 72 66  Resp: 19 (!) 25 19 16   Temp:      TempSrc:  SpO2:  90% (!) 87% 94%  Weight:      Height:        Intake/Output Summary (Last 24 hours) at 08/22/2020 1407 Last data filed at 08/22/2020 0741 Gross per 24 hour  Intake 5519 ml  Output 400 ml  Net 5119 ml   Filed Weights   08/01/2020 1429  Weight: 42 kg    Examination:  General exam: Appears calm and comfortable, elderly/frail in appearance Respiratory system: Breath sounds slightly decreased right base with mild crackles, otherwise  clear without wheezing, normal respiratory effort without accessory muscle use Cardiovascular system: S1 & S2 heard, RRR. No JVD, murmurs, rubs, gallops or clicks.  Bilateral ankle edema, nonpitting Gastrointestinal system: Abdomen is nondistended, soft and nontender. No organomegaly or masses felt. Normal bowel sounds heard. Central nervous system: Alert and oriented. No focal neurological deficits. Extremities: Symmetric 5 x 5 power. Skin: Left shin wound as depicted below Psychiatry: Judgement and insight appear normal. Mood & affect appropriate.        Data Reviewed: I have personally reviewed following labs and imaging studies  CBC: Recent Labs  Lab 08/29/2020 1500 08/22/20 0209 08/22/20 1003  WBC 13.1* 24.3*  --   NEUTROABS  --  22.5*  --   HGB 7.1* 6.3* 10.4*  HCT 24.2* 21.3* 33.9*  MCV 100.4* 100.0  --   PLT 563* 450*  --    Basic Metabolic Panel: Recent Labs  Lab 08/10/2020 1500 08/22/20 1003  NA 139 136  K 5.0 6.0*  CL 97* 99  CO2 27 24  GLUCOSE 140* 107*  BUN 46* 47*  CREATININE 1.77* 2.18*  CALCIUM 9.3 8.7*   GFR: Estimated Creatinine Clearance: 10.6 mL/min (A) (by C-G formula based on SCr of 2.18 mg/dL (H)). Liver Function Tests: Recent Labs  Lab 08/05/2020 1715  AST 135*  ALT 119*  ALKPHOS 118  BILITOT 0.5  PROT 6.3*  ALBUMIN 3.0*   No results for input(s): LIPASE, AMYLASE in the last 168 hours. No results for input(s): AMMONIA in the last 168 hours. Coagulation Profile: Recent Labs  Lab 08/07/2020 1500 08/22/20 1000  INR 1.0 1.3*   Cardiac Enzymes: No results for input(s): CKTOTAL, CKMB, CKMBINDEX, TROPONINI in the last 168 hours. BNP (last 3 results) No results for input(s): PROBNP in the last 8760 hours. HbA1C: No results for input(s): HGBA1C in the last 72 hours. CBG: Recent Labs  Lab 08/09/2020 2250  GLUCAP 79   Lipid Profile: No results for input(s): CHOL, HDL, LDLCALC, TRIG, CHOLHDL, LDLDIRECT in the last 72 hours. Thyroid  Function Tests: No results for input(s): TSH, T4TOTAL, FREET4, T3FREE, THYROIDAB in the last 72 hours. Anemia Panel: Recent Labs    08/22/20 1000 08/22/20 1003  VITAMINB12  --  599  FOLATE  --  16.1  FERRITIN  --  91  TIBC  --  318  IRON  --  50  RETICCTPCT 3.2*  --    Sepsis Labs: Recent Labs  Lab 08/14/2020 1500 08/29/2020 1715  PROCALCITON  --  0.15  LATICACIDVEN 2.9* 2.8*    Recent Results (from the past 240 hour(s))  SARS Coronavirus 2 by RT PCR (hospital order, performed in [facility] hospital lab) Nasopharyngeal Nasopharyngeal Swab     Status: None   Collection Time: 08/29/2020  3:11 PM   Specimen: Nasopharyngeal Swab  Result Value Ref Range Status   SARS Coronavirus 2 NEGATIVE NEGATIVE Final    Comment: (NOTE) SARS-CoV-2 target nucleic acids are NOT DETECTED.  The  SARS-CoV-2 RNA is generally detectable in upper and lower respiratory specimens during the acute phase of infection. The lowest concentration of SARS-CoV-2 viral copies this assay can detect is 250 copies / mL. A negative result does not preclude SARS-CoV-2 infection and should not be used as the sole basis for treatment or other patient management decisions.  A negative result may occur with improper specimen collection / handling, submission of specimen other than nasopharyngeal swab, presence of viral mutation(s) within the areas targeted by this assay, and inadequate number of viral copies (<250 copies / mL). A negative result must be combined with clinical observations, patient history, and epidemiological information.  Fact Sheet for Patients:   StrictlyIdeas.no  Fact Sheet for Healthcare Providers: BankingDealers.co.za  This test is not yet approved or  cleared by the [place] FDA and has been authorized for detection and/or diagnosis of SARS-CoV-2 by FDA under an Emergency Use Authorization (EUA).  This EUA will remain in effect (meaning this  test can be used) for the duration of the COVID-19 declaration under Section 564(b)(1) of the Act, 21 U.S.C. section 360bbb-3(b)(1), unless the authorization is terminated or revoked sooner.  Performed at [facility], [place] [address] [place], [place] [address]   Sputum Culture     Status: None (Preliminary result)   Collection Time: 08/22/20  2:01 AM   Specimen: Expectorated Sputum  Result Value Ref Range Status   Specimen Description EXPECTORATED SPUTUM  Final   Special Requests NONE  Final   Sputum evaluation   Final    Sputum specimen not acceptable for testing.  Please recollect.   RESULT CALLED TO, READ BACK BY AND VERIFIED WITH: Elige Radon RN 08/22/20 [id number] Performed at [facility], [address] [place], [place] [address]    Report Status PENDING  Incomplete         Radiology Studies: CT Chest Wo Contrast  Result Date: 08/06/2020 CLINICAL DATA:  Shortness of breath, weakness EXAM: CT CHEST WITHOUT CONTRAST TECHNIQUE: Multidetector CT imaging of the chest was performed following the standard protocol without IV contrast. COMPARISON:  Chest radiograph dated 08/24/2020 FINDINGS: Cardiovascular: Cardiomegaly.  No pericardial effusion. No evidence of thoracic aortic aneurysm. Atherosclerotic calcifications of the aortic arch. Three vessel coronary atherosclerosis. Mediastinum/Nodes: No suspicious mediastinal lymphadenopathy. Visualized thyroid is unremarkable. Lungs/Pleura: Moderate right pleural effusion with apical capping. Associated dependent atelectasis in the right middle lobe and compressive atelectasis in the right lower lobe. Small left pleural effusion. Associated mild left basilar atelectasis. Moderate centrilobular and paraseptal emphysematous changes, upper lung predominant. Evaluation of the lung parenchyma is constrained by respiratory motion. Within that constraint, there are no suspicious pulmonary nodules. No focal consolidation. No pneumothorax.  Upper Abdomen: Visualized upper abdomen is notable for vascular calcifications and left renal atrophy. Musculoskeletal: Exaggerated midthoracic kyphosis. Very mild degenerative changes of the thoracic spine. No fracture is seen. IMPRESSION: Moderate right and small left pleural effusions. Associated atelectasis. Aortic Atherosclerosis (ICD10-I70.0) and Emphysema (ICD10-J43.9). Electronically Signed   By: Julian Hy M.D.   On: 08/23/2020 17:54   DG Chest Portable 1 View  Result Date: 08/06/2020 CLINICAL DATA:  Shortness of breath EXAM: PORTABLE CHEST 1 VIEW COMPARISON:  07/10/2020 FINDINGS: Bilateral interstitial thickening. Patchy alveolar opacities throughout the right lung. Small right pleural effusion. No pneumothorax. Stable cardiomegaly. No acute osseous abnormality. IMPRESSION: Bilateral interstitial thickening and patchy alveolar opacities throughout the right lung and mild interstitial thickening of the left lung with cardiomegaly concerning for multilobar pneumonia  versus asymmetric pulmonary edema. Electronically Signed   By: Kathreen Devoid   On: 08/11/2020 15:06        Scheduled Meds: . acetaminophen  500 mg Oral Q6H  . acetaminophen  650 mg Oral Once  . acidophilus  1 capsule Oral QPC lunch  . azithromycin  500 mg Oral Daily  . diphenhydrAMINE  25 mg Oral Once  . docusate sodium  100 mg Oral Daily  . feeding supplement  237 mL Oral BID BM  . pantoprazole (PROTONIX) IV  40 mg Intravenous Q12H   Continuous Infusions: . cefTRIAXone (ROCEPHIN)  IV Stopped (08/25/2020 2334)  . [START ON 08/23/2020] vancomycin       LOS: 1 day    Time spent: 54 minutes spent on chart review, discussion with nursing staff, consultants, updating family and interview/physical exam; more than 50% of that time was spent in counseling and/or coordination of care.    Milina Pagett J [place] DO [facility] Available via Epic secure chat 7am-7pm After these hours, please refer to coverage provider  listed on [email or web address] 08/22/2020, 2:07 PM

## 2020-08-22 NOTE — Plan of Care (Signed)

## 2020-08-22 NOTE — ED Notes (Signed)
Dr. Sidney Ace notified that pt's pressures were becoming softer, dipping into the 80's sBP. WE agreed to hold furosemide and hydralazine but continue with 250 bolus.

## 2020-08-22 NOTE — ED Notes (Signed)
Rate Dose change for blood admin to 250 mL/hr after initial 15.

## 2020-08-23 ENCOUNTER — Inpatient Hospital Stay (HOSPITAL_COMMUNITY): Payer: Medicare Other

## 2020-08-23 ENCOUNTER — Other Ambulatory Visit (HOSPITAL_COMMUNITY): Payer: Medicare Other

## 2020-08-23 DIAGNOSIS — N1832 Chronic kidney disease, stage 3b: Secondary | ICD-10-CM | POA: Diagnosis not present

## 2020-08-23 DIAGNOSIS — J189 Pneumonia, unspecified organism: Secondary | ICD-10-CM | POA: Diagnosis not present

## 2020-08-23 DIAGNOSIS — I10 Essential (primary) hypertension: Secondary | ICD-10-CM | POA: Diagnosis not present

## 2020-08-23 DIAGNOSIS — A419 Sepsis, unspecified organism: Secondary | ICD-10-CM | POA: Diagnosis not present

## 2020-08-23 DIAGNOSIS — L8990 Pressure ulcer of unspecified site, unspecified stage: Secondary | ICD-10-CM | POA: Insufficient documentation

## 2020-08-23 DIAGNOSIS — I5032 Chronic diastolic (congestive) heart failure: Secondary | ICD-10-CM

## 2020-08-23 DIAGNOSIS — E785 Hyperlipidemia, unspecified: Secondary | ICD-10-CM

## 2020-08-23 LAB — COMPREHENSIVE METABOLIC PANEL
ALT: 182 U/L — ABNORMAL HIGH (ref 0–44)
AST: 156 U/L — ABNORMAL HIGH (ref 15–41)
Albumin: 2.5 g/dL — ABNORMAL LOW (ref 3.5–5.0)
Alkaline Phosphatase: 164 U/L — ABNORMAL HIGH (ref 38–126)
Anion gap: 13 (ref 5–15)
BUN: 56 mg/dL — ABNORMAL HIGH (ref 8–23)
CO2: 23 mmol/L (ref 22–32)
Calcium: 8.5 mg/dL — ABNORMAL LOW (ref 8.9–10.3)
Chloride: 99 mmol/L (ref 98–111)
Creatinine, Ser: 2.56 mg/dL — ABNORMAL HIGH (ref 0.44–1.00)
GFR, Estimated: 17 mL/min — ABNORMAL LOW (ref >60)
Glucose, Bld: 70 mg/dL (ref 70–99)
Potassium: 5.9 mmol/L — ABNORMAL HIGH (ref 3.5–5.1)
Sodium: 135 mmol/L (ref 135–145)
Total Bilirubin: 0.6 mg/dL (ref 0.3–1.2)
Total Protein: 5.5 g/dL — ABNORMAL LOW (ref 6.5–8.1)

## 2020-08-23 LAB — ECHOCARDIOGRAM COMPLETE
Height: 58 in
S' Lateral: 2.3 cm
Single Plane A4C EF: 70.2 %
Weight: 1481.49 oz

## 2020-08-23 LAB — CBC
HCT: 32.8 % — ABNORMAL LOW (ref 36.0–46.0)
Hemoglobin: 10.6 g/dL — ABNORMAL LOW (ref 12.0–15.0)
MCH: 29.7 pg (ref 26.0–34.0)
MCHC: 32.3 g/dL (ref 30.0–36.0)
MCV: 91.9 fL (ref 80.0–100.0)
Platelets: 339 10*3/uL (ref 150–400)
RBC: 3.57 MIL/uL — ABNORMAL LOW (ref 3.87–5.11)
RDW: 20.3 % — ABNORMAL HIGH (ref 11.5–15.5)
WBC: 17.6 10*3/uL — ABNORMAL HIGH (ref 4.0–10.5)
nRBC: 0.8 % — ABNORMAL HIGH (ref 0.0–0.2)

## 2020-08-23 LAB — TYPE AND SCREEN
ABO/RH(D): A POS
Antibody Screen: NEGATIVE
Unit division: 0
Unit division: 0

## 2020-08-23 LAB — BPAM RBC
Blood Product Expiration Date: 202202112359
Blood Product Expiration Date: 202202112359
ISSUE DATE / TIME: 202201220140
ISSUE DATE / TIME: 202201220418
Unit Type and Rh: 6200
Unit Type and Rh: 6200

## 2020-08-23 LAB — MAGNESIUM: Magnesium: 1.8 mg/dL (ref 1.7–2.4)

## 2020-08-23 MED ORDER — SODIUM ZIRCONIUM CYCLOSILICATE 10 G PO PACK
10.0000 g | PACK | Freq: Once | ORAL | Status: AC
  Administered 2020-08-23: 10 g via ORAL
  Filled 2020-08-23: qty 1

## 2020-08-23 MED ORDER — SODIUM CHLORIDE 0.9 % IV SOLN: INTRAVENOUS | Status: DC

## 2020-08-23 NOTE — Plan of Care (Signed)

## 2020-08-23 NOTE — Progress Notes (Addendum)
PROGRESS NOTE    Kristina [name]  [id number] DOB: 1926/05/20 DOA: 08/29/2020 PCP: Holland Commons, FNP    Brief Narrative:  Kristina [name] is a 87 year old female with past medical history significant for CKD stage IIIb, solitary kidney, hyperlipidemia, essential hypertension, peripheral artery disease, iron deficiency anemia, peptic ulcer disease, AAA, chronic hypoxic respiratory failure on 5L [place] baseline who presented to the ED via EMS from [place] with complaints of shortness of breath and chest pain.  Upon EMS arrival, patient was found to be hypoxic with SPO2 in the 70s due to apparent oxygen machine malfunction.  Patient complained of sharp chest pain, 9/10 and was administered 1 nitroglycerin tablet and aspirin 324 mg orally.  Patient stated that onset of symptoms, roughly 2 days ago with mild nonproductive cough.  She denies any fevers, chills, night sweats, no nausea/vomiting/diarrhea, no abdominal pain.  Denies any recent sick contacts.  Patient has been vaccinated against COVID-19.    In the ED temperature 94.4 F, HR 76, RR 25, BP 159/90, SPO2 100% on 4 L nasal cannula.  Sodium 139, potassium 5.0, chloride 97, CO2 27, glucose 140, BUN 46, creatinine 1.77, BNP 752.6, troponin 372>477.  Lactic acid 2.9.  BBC 13.1, hemoglobin 7.1, MCV 100.4, platelets 563.  INR 1.0. SARS-CoV-2/COVID-19 PCR negative.  Chest x-ray with bilateral interstitial thickening and patchy alveolar opacities throughout right lung and mid interstitial thickening of the left lung with cardiomegaly concerning for multilobar pneumonia versus asymmetric pulmonary edema.  CT chest without contrast with moderate right and small left pleural effusions with associated atelectasis.  Patient was given 1 L LR bolus, started on vancomycin and cefepime.  Hospitalist service consulted for further evaluation and management.  During night of admission, patient started developing hypotension despite aggressive  resuscitation with IV fluids at 30 mL/kg.  PCCM was consulted for possible transfer to ICU for vasopressor initiation.  Patient was also noted to have a drop in her hemoglobin from 7.1-6.3 and was transfused 2 unit PRBC.  Patient was evaluated by Dr. Lake Bells, who noted that her blood pressure improved with blood transfusion and further discussion with family that patient would not want aggressive measures such as CPR or intubation and that patient was stable to remain on the hospitalist service.   Assessment & Plan:   Active Problems:   Atherosclerotic PVD with intermittent claudication - near occlusive SFA disease with moderate iliac disease   Essential hypertension   Hyperlipidemia with target low density lipoprotein (LDL) cholesterol less than 70 mg/dL   Chronic kidney disease (CKD), stage III (moderate) (HCC)   Protein-calorie malnutrition (HCC)   Lobar pneumonia (HCC)   Sepsis (HCC)   CAP (community acquired pneumonia)   Septic shock (HCC)   Severe sepsis (HCC)   Pressure injury of skin   Severe sepsis, present on admission Community-acquired pneumonia GNR Urinary tract infection Patient presenting from nursing facility with shortness of breath, cough.  Patient hypothermic with temperature 94.4 F and elevated WBC count of 13.1 on admission.  Urinalysis with moderate leukoesterase, negative nitrite, many bacteria and greater than 50 WBCs.  Lactic acid elevated 2.9 with procalcitonin 0.15.  Chest x-ray with findings consistent with multilobar pneumonia.  COVID-19 PCR negative. --WBC 13.1>24.3>17.6 --Blood cultures x2: NG x 2 days --Sputum culture: Pending --Urine culture: >100K GNR --Azithromycin 500 mg IV every 24 hours --Ceftriaxone 2 g IV every 24 hours --MRSA PCR negative, dc Vancomycin today --Follow CBC daily  Acute on chronic hypoxic respiratory failure  Patient was found by EMS with SPO2 in the 70s, at baseline on 5 L nasal cannula.  Apparently oxygen machine at SNF  malfunctioning. --Currently on 2.5 L nasal cannula with SPO2 93%.  Symptomatic anemia Hx iron deficiency anemia Hx of peptic ulcer disease Patient presenting with hemoglobin 7.1.  Baseline 8.0-9.1. Hx of peptic ulcer disease, follows with GI, Dr. [facility] [place] with EGD 07/07/2020 with few cratered nonbleeding duodenal ulcers.  Anemia panel with iron 50, TIBC 318, ferritin 91, B12 591, folate 16.1. --Hgb 7.1>6.3>10.4>10.6, stable --s/p 2u pRBC on 1/22 --Holding home aspirin --Continue home ferrous sulfate 325 mg p.o. daily --FOBT pending --Protonix 40 mg IV every 12 hours --continue to monitor H/H q6h  Acute renal failure on CKD stage IIIb Baseline creatinine 1.2-1.4.  Patient with solitary kidney.  Creatinine 1.77 on admission.  Etiology likely secondary to prerenal azotemia from dehydration versus ATN from sepsis and hypotension. --Cr 1.77>2.18>2.56 --Hold home antihypertensives, renally dose all medications --Treatment for sepsis as above --NS at 70mL/hr --Strict I's and O's --Follow BMP daily  Hyperkalemia K 5.9 this morning. Etiology likely secondary to renal failure. --[place] 10 g p.o. x1 today --Monitor on telemetry --Repeat BMP in a.m.  Elevated troponin likely secondary to type II demand ischemia Patient presenting with chest pain in the setting of hypoxia as her oxygen was malfunctioning at her SNF as well as anemia.  Troponin elevated at 372>477 on ED arrival.  With NSR, rate 80, QTc 439, no concerning ST elevation/depression or T wave inversions. --Currently chest pain-free --Continue to monitor on telemetry --TTE pending  Elevated LFTs Etiology likely secondary to mild shock liver from hypotension/severe sepsis as above. --AST 135>156 --ALT 119>182 --Tbili 0.6 --Avoid hepatotoxins --follow LFTs daily  Essential hypertension Chronic diastolic congestive heart failure TTE April 2009 with LVEF 55%. --Repeat TTE: Pending --Holding home hydralazine --Strict I's and O's  and daily weights --Monitor BP closely  Left anterior shin wound, POA --Wound care consultation for evaluation  Severe protein calorie malnutrition Body mass index is 19.35 kg/m.  Patient very thin, cachectic emaciated in appearance -- Nutrition consult for recommendations and supplementation   DVT prophylaxis: SCDs, holding chemical DVT prophylaxis due to profound anemia, concerning for GI bleed   Code Status: DNR Family Communication: No family present at bedside this morning.  Disposition Plan:  Level of care: Telemetry Medical Status is: Inpatient  Remains inpatient appropriate because:Hemodynamically unstable, Altered mental status, Ongoing diagnostic testing needed not appropriate for outpatient work up, Unsafe d/c plan, IV treatments appropriate due to intensity of illness or inability to take PO and Inpatient level of care appropriate due to severity of illness   Dispo: The patient is from: SNF              Anticipated d/c is to: SNF              Anticipated d/c date is: 3 days              Patient currently is not medically stable to d/c.   Difficult to place patient No   Consultants:   PCCM - signed off 1/22  Procedures:   TTE  Antimicrobials:   Vancomycin 1/21 - 1/23  Azithromycin 1/21>>  Ceftriaxone 1/21>>  Cefepime 1/21 - 1/21    Subjective: Patient seen and examined at bedside, sleeping but easily arousable.  Complaining of some discomfort to her sacral area and requesting pain medication.  No other specific questions or concerns at this time.  Hemoglobin now  stable, WBC count improving.  Noted GNR on urine culture.  MRSA PCR negative, will discontinue vancomycin today.  No family present at bedside this morning.  Denies headache, no visual changes, no chest pain, no palpitations, no abdominal pain, no nausea/vomiting/diarrhea, no fever/chills/night sweats.  No acute concerns overnight per nursing staff.  Objective: Vitals:   08/22/20 1454 08/22/20  1929 08/23/20 0413 08/23/20 0831  BP: 117/72 (!) 141/70 127/68 131/83  Pulse: 70 73 83 80  Resp: 17 16 17 18   Temp: 97.6 F (36.4 C) 98 F (36.7 C) 97.8 F (36.6 C) 98.1 F (36.7 C)  TempSrc: Oral  Axillary Oral  SpO2: 96% 93% 93% 97%  Weight:      Height:        Intake/Output Summary (Last 24 hours) at 08/23/2020 1111 Last data filed at 08/23/2020 0436 Gross per 24 hour  Intake 420 ml  Output -  Net 420 ml   Filed Weights   2020/09/05 1429  Weight: 42 kg    Examination:  General exam: Appears calm and comfortable, elderly/frail in appearance Respiratory system: Breath sounds slightly decreased right base with mild crackles, otherwise clear without wheezing, normal respiratory effort without accessory muscle use Cardiovascular system: S1 & S2 heard, RRR. No JVD, murmurs, rubs, gallops or clicks.  Bilateral ankle edema, nonpitting Gastrointestinal system: Abdomen is nondistended, soft and nontender. No organomegaly or masses felt. Normal bowel sounds heard. Central nervous system: Alert and oriented. No focal neurological deficits. Extremities: Symmetric 5 x 5 power. Skin: Left shin wound as depicted below Psychiatry: Judgement and insight appear normal. Mood & affect appropriate.        Data Reviewed: I have personally reviewed following labs and imaging studies  CBC: Recent Labs  Lab 09-05-2020 1500 08/22/20 0209 08/22/20 1003 08/22/20 1600 08/22/20 2100 08/23/20 0852  WBC 13.1* 24.3*  --   --   --  17.6*  NEUTROABS  --  22.5*  --   --   --   --   HGB 7.1* 6.3* 10.4* 10.5* 10.6* 10.6*  HCT 24.2* 21.3* 33.9* 33.7* 33.3* 32.8*  MCV 100.4* 100.0  --   --   --  91.9  PLT 563* 450*  --   --   --  160   Basic Metabolic Panel: Recent Labs  Lab 09-05-20 1500 08/22/20 1003 08/23/20 0214  NA 139 136 135  K 5.0 6.0* 5.9*  CL 97* 99 99  CO2 27 24 23   GLUCOSE 140* 107* 70  BUN 46* 47* 56*  CREATININE 1.77* 2.18* 2.56*  CALCIUM 9.3 8.7* 8.5*  MG  --   --  1.8    GFR: Estimated Creatinine Clearance: 9.1 mL/min (A) (by C-G formula based on SCr of 2.56 mg/dL (H)). Liver Function Tests: Recent Labs  Lab September 05, 2020 1715 08/23/20 0214  AST 135* 156*  ALT 119* 182*  ALKPHOS 118 164*  BILITOT 0.5 0.6  PROT 6.3* 5.5*  ALBUMIN 3.0* 2.5*   No results for input(s): LIPASE, AMYLASE in the last 168 hours. No results for input(s): AMMONIA in the last 168 hours. Coagulation Profile: Recent Labs  Lab 2020-09-05 1500 08/22/20 1000  INR 1.0 1.3*   Cardiac Enzymes: No results for input(s): CKTOTAL, CKMB, CKMBINDEX, TROPONINI in the last 168 hours. BNP (last 3 results) No results for input(s): PROBNP in the last 8760 hours. HbA1C: No results for input(s): HGBA1C in the last 72 hours. CBG: Recent Labs  Lab 2020-09-05 2250  GLUCAP 79   Lipid  Profile: No results for input(s): CHOL, HDL, LDLCALC, TRIG, CHOLHDL, LDLDIRECT in the last 72 hours. Thyroid Function Tests: No results for input(s): TSH, T4TOTAL, FREET4, T3FREE, THYROIDAB in the last 72 hours. Anemia Panel: Recent Labs    08/22/20 1000 08/22/20 1003  VITAMINB12  --  599  FOLATE  --  16.1  FERRITIN  --  91  TIBC  --  318  IRON  --  50  RETICCTPCT 3.2*  --    Sepsis Labs: Recent Labs  Lab 08/09/2020 1500 08/24/2020 1715  PROCALCITON  --  0.15  LATICACIDVEN 2.9* 2.8*    Recent Results (from the past 240 hour(s))  Blood Culture (routine x 2)     Status: None (Preliminary result)   Collection Time: 08/26/2020  3:11 PM   Specimen: BLOOD  Result Value Ref Range Status   Specimen Description BLOOD BLOOD LEFT FOREARM  Final   Special Requests   Final    BOTTLES DRAWN AEROBIC AND ANAEROBIC Blood Culture adequate volume   Culture   Final    NO GROWTH 2 DAYS Performed at [facility], [place] [address] [place], [place] [address]    Report Status PENDING  Incomplete  SARS Coronavirus 2 by RT PCR (hospital order, performed in [facility] hospital lab) Nasopharyngeal Nasopharyngeal Swab      Status: None   Collection Time: 08/31/2020  3:11 PM   Specimen: Nasopharyngeal Swab  Result Value Ref Range Status   SARS Coronavirus 2 NEGATIVE NEGATIVE Final    Comment: (NOTE) SARS-CoV-2 target nucleic acids are NOT DETECTED.  The SARS-CoV-2 RNA is generally detectable in upper and lower respiratory specimens during the acute phase of infection. The lowest concentration of SARS-CoV-2 viral copies this assay can detect is 250 copies / mL. A negative result does not preclude SARS-CoV-2 infection and should not be used as the sole basis for treatment or other patient management decisions.  A negative result may occur with improper specimen collection / handling, submission of specimen other than nasopharyngeal swab, presence of viral mutation(s) within the areas targeted by this assay, and inadequate number of viral copies (<250 copies / mL). A negative result must be combined with clinical observations, patient history, and epidemiological information.  Fact Sheet for Patients:   StrictlyIdeas.no  Fact Sheet for Healthcare Providers: BankingDealers.co.za  This test is not yet approved or  cleared by the [place] FDA and has been authorized for detection and/or diagnosis of SARS-CoV-2 by FDA under an Emergency Use Authorization (EUA).  This EUA will remain in effect (meaning this test can be used) for the duration of the COVID-19 declaration under Section 564(b)(1) of the Act, 21 U.S.C. section 360bbb-3(b)(1), unless the authorization is terminated or revoked sooner.  Performed at [facility], [place] [address] [place], [place] [address]   Urine culture     Status: Abnormal (Preliminary result)   Collection Time: 08/09/2020  6:09 PM   Specimen: In/Out Cath Urine  Result Value Ref Range Status   Specimen Description IN/OUT CATH URINE  Final   Special Requests   Final    NONE Performed at [facility], [place]  [address], [place], [place] [address]    Culture >=100,000 COLONIES/mL GRAM NEGATIVE RODS (A)  Final   Report Status PENDING  Incomplete  Sputum Culture     Status: None   Collection Time: 08/22/20  2:01 AM   Specimen: Expectorated Sputum  Result Value Ref Range Status   Specimen  Description EXPECTORATED SPUTUM  Final   Special Requests NONE  Final   Sputum evaluation   Final    Sputum specimen not acceptable for testing.  Please recollect.   RESULT CALLED TO, READ BACK BY AND VERIFIED WITH: Vincent Peyer RN 08/22/20 3832 JDW Performed at [facility], [address], [place], [place] [address]    Report Status 08/22/2020 FINAL  Final  Blood Culture (routine x 2)     Status: None (Preliminary result)   Collection Time: 08/22/20 10:03 AM   Specimen: BLOOD  Result Value Ref Range Status   Specimen Description BLOOD LEFT ANTECUBITAL  Final   Special Requests   Final    BOTTLES DRAWN AEROBIC AND ANAEROBIC Blood Culture results may not be optimal due to an excessive volume of blood received in culture bottles   Culture   Final    NO GROWTH 1 DAY Performed at [facility], [address], [place], [place] [address]    Report Status PENDING  Incomplete  MRSA PCR Screening     Status: None   Collection Time: 08/22/20  2:33 PM   Specimen: Nasal Mucosa; Nasopharyngeal  Result Value Ref Range Status   MRSA by PCR NEGATIVE NEGATIVE Final    Comment:        The GeneXpert MRSA Assay (FDA approved for NASAL specimens only), is one component of a comprehensive MRSA colonization surveillance program. It is not intended to diagnose MRSA infection nor to guide or monitor treatment for MRSA infections. Performed at [facility], [address] [place], [place] [address]          Radiology Studies: CT Chest Wo Contrast  Result Date: 08/29/2020 CLINICAL DATA:  Shortness of breath, weakness EXAM: CT CHEST WITHOUT CONTRAST TECHNIQUE: Multidetector CT imaging of the chest was  performed following the standard protocol without IV contrast. COMPARISON:  Chest radiograph dated 08/19/2020 FINDINGS: Cardiovascular: Cardiomegaly.  No pericardial effusion. No evidence of thoracic aortic aneurysm. Atherosclerotic calcifications of the aortic arch. Three vessel coronary atherosclerosis. Mediastinum/Nodes: No suspicious mediastinal lymphadenopathy. Visualized thyroid is unremarkable. Lungs/Pleura: Moderate right pleural effusion with apical capping. Associated dependent atelectasis in the right middle lobe and compressive atelectasis in the right lower lobe. Small left pleural effusion. Associated mild left basilar atelectasis. Moderate centrilobular and paraseptal emphysematous changes, upper lung predominant. Evaluation of the lung parenchyma is constrained by respiratory motion. Within that constraint, there are no suspicious pulmonary nodules. No focal consolidation. No pneumothorax. Upper Abdomen: Visualized upper abdomen is notable for vascular calcifications and left renal atrophy. Musculoskeletal: Exaggerated midthoracic kyphosis. Very mild degenerative changes of the thoracic spine. No fracture is seen. IMPRESSION: Moderate right and small left pleural effusions. Associated atelectasis. Aortic Atherosclerosis (ICD10-I70.0) and Emphysema (ICD10-J43.9). Electronically Signed   By: Charline Bills M.D.   On: 08/04/2020 17:54   DG Chest Portable 1 View  Result Date: 08/17/2020 CLINICAL DATA:  Shortness of breath EXAM: PORTABLE CHEST 1 VIEW COMPARISON:  07/10/2020 FINDINGS: Bilateral interstitial thickening. Patchy alveolar opacities throughout the right lung. Small right pleural effusion. No pneumothorax. Stable cardiomegaly. No acute osseous abnormality. IMPRESSION: Bilateral interstitial thickening and patchy alveolar opacities throughout the right lung and mild interstitial thickening of the left lung with cardiomegaly concerning for multilobar pneumonia versus asymmetric pulmonary  edema. Electronically Signed   By: Elige Ko   On: 08/12/2020 15:06        Scheduled Meds: . acetaminophen  500 mg Oral Q6H  . acetaminophen  650 mg Oral Once  . acidophilus  1 capsule Oral QPC lunch  . azithromycin  500 mg Oral Daily  . diphenhydrAMINE  25 mg Oral Once  . docusate sodium  100 mg Oral Daily  . feeding supplement  237 mL Oral BID BM  . ferrous sulfate  325 mg Oral Q breakfast  . pantoprazole (PROTONIX) IV  40 mg Intravenous Q12H   Continuous Infusions: . sodium chloride 75 mL/hr at 08/23/20 0958  . cefTRIAXone (ROCEPHIN)  IV 2 g (08/22/20 1849)  . vancomycin       LOS: 2 days    Time spent: 39 minutes spent on chart review, discussion with nursing staff, consultants, updating family and interview/physical exam; more than 50% of that time was spent in counseling and/or coordination of care.    Madinah Quarry J [place] DO [facility] Available via Epic secure chat 7am-7pm After these hours, please refer to coverage provider listed on [email or web address] 08/23/2020, 11:11 AM

## 2020-08-23 NOTE — TOC Initial Note (Signed)
Transition of Care [facility]) - Initial/Assessment Note    Patient Details  Name: PHYLLIS ABELSON MRN: [id number] Date of Birth: 1926/03/31  Transition of Care [facility]) CM/SW Contact:    Joanne Chars, LCSW Phone Number: 08/23/2020, 2:19 PM  Clinical Narrative:  CSW attempted to meet with pt to discuss discharge plan.  Pt asleep and did not respond to CSW speaking with her. Choice document left in pt room.  CSW called pt daughter [name] who reports pt has been at [facility] since mid-December.  [name] in agreement with plan for SNF but does not want pt to return to [facility].  [name] aware pt is in copay days, pt has secondary insurance coverage.  Pt lived alone with significant family support prior to her admission in December 2021.  Pt has had one shot of covid vaccine.  Equipment in home: rollator.                 Expected Discharge Plan: Skilled Nursing Facility Barriers to Discharge: Continued Medical Work up,SNF Pending bed offer   Patient Goals and CMS Choice   CMS Medicare.gov Compare Post Acute Care list provided to:: Patient Represenative (must comment) Choice offered to / list presented to : Adult Children  Expected Discharge Plan and Services Expected Discharge Plan: [place] In-house Referral: Clinical Social Work Discharge Planning Services: CM Consult Post Acute Care Choice: [place] Living arrangements for the past 2 months: [facility])                                      Prior Living Arrangements/Services Living arrangements for the past 2 months: [employer], contracting) Lives with:: Facility Resident Patient language and need for interpreter reviewed:: No        Need for Family Participation in Patient Care: Yes (Comment) Care giver support system in place?: Yes (comment) Current home services: Other (comment) (none) Criminal Activity/Legal Involvement Pertinent to Current  Situation/Hospitalization: No - Comment as needed  Activities of Daily Living      Permission Sought/Granted                  Emotional Assessment Appearance:: Appears stated age Attitude/Demeanor/Rapport: Unable to Assess Affect (typically observed): Unable to Assess Orientation: : Oriented to Self,Oriented to Place,Oriented to  Time,Oriented to Situation Alcohol / Substance Use: Not Applicable Psych Involvement: No (comment)  Admission diagnosis:  CAP (community acquired pneumonia) [J18.9] Hypothermia, initial encounter [T68.XXXA] Septic shock ([place]) [A41.9, R65.21] Severe sepsis ([place]) [A41.9, R65.20] Multifocal pneumonia [J18.9] Sepsis with acute renal failure without septic shock, due to unspecified organism, unspecified acute renal failure type ([place]) [A41.9, R65.20, N17.9] Patient Active Problem List   Diagnosis Date Noted  . Pressure injury of skin 08/23/2020  . Septic shock ([place]) 08/22/2020  . Severe sepsis ([place]) 08/22/2020  . Lobar pneumonia ([place]) 08/10/2020  . Sepsis ([place]) 08/19/2020  . CAP (community acquired pneumonia) 08/31/2020  . Protein-calorie malnutrition, severe 07/10/2020  . Symptomatic anemia 07/06/2020  . GI bleed 07/06/2020  . Sepsis due to cellulitis ([place]) 07/06/2020  . Lt Hip fracture ([place]) 08/17/2017  . Fecal impaction in rectum ([place]) 08/17/2017  . Bilateral carotid bruits 07/18/2017  . Dizziness 12/09/2016  . Weakness 12/09/2016  . Malnutrition of moderate degree 12/03/2016  . Acute hyponatremia 12/01/2016  . Protein-calorie malnutrition ([place]) 12/01/2016  . Anemia 12/01/2016  . Pancreatic calcification 12/01/2016  .  Abdominal pain, epigastric 12/01/2016  . Hyponatremia   . Claudication ([place]) 06/27/2016  . 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 Essential hypertension   . Hyperlipidemia with target low density lipoprotein (LDL)  cholesterol less than 70 mg/dL   . Chronic kidney disease (CKD), stage III (moderate) (HCC)   . AAA (abdominal aortic aneurysm) ([place])    PCP:  Holland Commons, FNP Pharmacy:   [facility] [place], [place] [address] [place] [address] Phone: [phone] Fax: [phone]  [facility] [address] [place] [address] Phone: [phone] Fax: [phone]     Social Determinants of Health (SDOH) Interventions    Readmission Risk Interventions No flowsheet data found.

## 2020-08-23 NOTE — Progress Notes (Signed)
Wound care completed as ordered, pain med given prior and patient was repositioned after, tolerated it well, will continue to monitor

## 2020-08-23 NOTE — [place] FL2 (Signed)
[place] MEDICAID FL2 LEVEL OF CARE SCREENING TOOL     IDENTIFICATION  Patient Name: Kristina [name] Birthdate: 05-24-1926 Sex: female Admission Date (Current Location): 08-31-20  [facility] and [place] Number:  Herbalist and Address:  [facility], [place] [address], [place], [place] [address]      Provider Number: [id number]  Attending Physician Name and Address:  [place] Eric J, DO  Relative Name and Phone Number:  [name] Daughter [phone]  [phone]    Current Level of Care: Hospital Recommended Level of Care: [place] Prior Approval Number:    Date Approved/Denied:   PASRR Number: [id number]  Discharge Plan: SNF    Current Diagnoses: Patient Active Problem List   Diagnosis Date Noted  . Pressure injury of skin 08/23/2020  . Septic shock ([place]) 08/22/2020  . Severe sepsis ([place]) 08/22/2020  . Lobar pneumonia ([place]) 31-Aug-2020  . Sepsis ([place]) 2020-08-31  . CAP (community acquired pneumonia) August 31, 2020  . Protein-calorie malnutrition, severe 07/10/2020  . Symptomatic anemia 07/06/2020  . GI bleed 07/06/2020  . Sepsis due to cellulitis ([place]) 07/06/2020  . Lt Hip fracture ([place]) 08/17/2017  . Fecal impaction in rectum ([place]) 08/17/2017  . Bilateral carotid bruits 07/18/2017  . Dizziness 12/09/2016  . Weakness 12/09/2016  . Malnutrition of moderate degree 12/03/2016  . Acute hyponatremia 12/01/2016  . Protein-calorie malnutrition ([place]) 12/01/2016  . Anemia 12/01/2016  . Pancreatic calcification 12/01/2016  . Abdominal pain, epigastric 12/01/2016  . Hyponatremia   . Claudication ([place]) 06/27/2016  . 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 Essential hypertension   . Hyperlipidemia with target low density lipoprotein (LDL) cholesterol less than 70 mg/dL   . Chronic kidney disease (CKD), stage III (moderate) (HCC)   . AAA  (abdominal aortic aneurysm) (HCC)     Orientation RESPIRATION BLADDER Height & Weight     Self,Time,Situation,Place  O2 Incontinent Weight: 92 lb 9.5 oz (42 kg) Height:  4\' 10"  (147.3 cm)  BEHAVIORAL SYMPTOMS/MOOD NEUROLOGICAL BOWEL NUTRITION STATUS      Continent Diet (Regular.  See discharge summary)  AMBULATORY STATUS COMMUNICATION OF NEEDS Skin   Limited Assist Verbally Skin abrasions                       Personal Care Assistance Level of Assistance  Bathing,Feeding,Dressing Bathing Assistance: Limited assistance Feeding assistance: Limited assistance Dressing Assistance: Limited assistance     Functional Limitations Info  Sight,Hearing,Speech Sight Info: Adequate Hearing Info: Adequate Speech Info: Adequate    SPECIAL CARE FACTORS FREQUENCY  PT (By licensed PT),OT (By licensed OT)     PT Frequency: 5x week OT Frequency: 5x week            Contractures Contractures Info: Not present    Additional Factors Info  Code Status,Allergies Code Status Info: DNR Allergies Info: Lipitor (Atorvastatin), Pletal (Cilostazol), Simvastatin           Current Medications (08/23/2020):  This is the current hospital active medication list Current Facility-Administered Medications  Medication Dose Route Frequency Provider Last Rate Last Admin  . 0.9 %  sodium chloride infusion   Intravenous Continuous [place] Eric J, [place] 75 mL/hr at 08/23/20 0958 New Bag at 08/23/20 0958  . acetaminophen (TYLENOL) tablet 500 mg  500 mg Oral Q6H Norins, Heinz Knuckles, MD   500 mg at 08/23/20 0958  . acetaminophen (TYLENOL) tablet 650 mg  650  mg Oral Once Mansy, Jan A, MD      . acidophilus (RISAQUAD) capsule 1 capsule  1 capsule Oral QPC lunch Norins, Heinz Knuckles, MD   1 capsule at 08/23/20 1349  . azithromycin (ZITHROMAX) tablet 500 mg  500 mg Oral Daily Norins, Heinz Knuckles, MD   500 mg at 08/23/20 1003  . cefTRIAXone (ROCEPHIN) 2 g in sodium chloride 0.9 % 100 mL IVPB  2 g Intravenous Q24H  Norins, Heinz Knuckles, MD 200 mL/hr at 08/22/20 1849 2 g at 08/22/20 1849  . diphenhydrAMINE (BENADRYL) capsule 25 mg  25 mg Oral Once Mansy, Jan A, MD      . docusate sodium (COLACE) capsule 100 mg  100 mg Oral Daily Norins, Heinz Knuckles, MD   100 mg at 08/23/20 0958  . feeding supplement (ENSURE ENLIVE / ENSURE PLUS) liquid 237 mL  237 mL Oral BID BM Norins, Heinz Knuckles, MD   237 mL at 08/23/20 1350  . ferrous sulfate tablet 325 mg  325 mg Oral Q breakfast [place] Eric J, DO   325 mg at 08/23/20 4967  . hydrALAZINE (APRESOLINE) tablet 25 mg  25 mg Oral Q6H PRN [place] Eric J, DO      . ipratropium-albuterol (DUONEB) 0.5-2.5 (3) MG/3ML nebulizer solution 3 mL  3 mL Nebulization Q6H PRN [place] Eric J, DO      . ondansetron (ZOFRAN) tablet 4 mg  4 mg Oral Q6H PRN Norins, Heinz Knuckles, MD      . pantoprazole (PROTONIX) injection 40 mg  40 mg Intravenous Q12H [place] Donnamarie Poag, DO   40 mg at 08/23/20 0959  . polyethylene glycol (MIRALAX / GLYCOLAX) packet 17 g  17 g Oral Daily PRN Norins, Heinz Knuckles, MD      . traMADol Veatrice Bourbon) tablet 50 mg  50 mg Oral Q6H PRN Norins, Heinz Knuckles, MD   50 mg at 08/23/20 5916     Discharge Medications: Please see discharge summary for a list of discharge medications.  Relevant Imaging Results:  Relevant Lab Results:   Additional Information SS#: [id number]  Joanne Chars, LCSW

## 2020-08-23 NOTE — Progress Notes (Signed)
  Echocardiogram 2D Echocardiogram has been performed.  Kristina [name] F 08/23/2020, 11:29 AM

## 2020-08-23 NOTE — Plan of Care (Signed)
  Problem: Activity: °Goal: Risk for activity intolerance will decrease °Outcome: Not Progressing °  °Problem: Nutrition: °Goal: Adequate nutrition will be maintained °Outcome: Not Progressing °  °Problem: Coping: °Goal: Level of anxiety will decrease °Outcome: Not Progressing °  °Problem: Pain Managment: °Goal: General experience of comfort will improve °Outcome: Not Progressing °  °

## 2020-08-24 ENCOUNTER — Inpatient Hospital Stay (HOSPITAL_COMMUNITY): Payer: Medicare Other

## 2020-08-24 DIAGNOSIS — E43 Unspecified severe protein-calorie malnutrition: Secondary | ICD-10-CM | POA: Diagnosis not present

## 2020-08-24 DIAGNOSIS — A419 Sepsis, unspecified organism: Secondary | ICD-10-CM | POA: Diagnosis not present

## 2020-08-24 DIAGNOSIS — N1832 Chronic kidney disease, stage 3b: Secondary | ICD-10-CM | POA: Diagnosis not present

## 2020-08-24 DIAGNOSIS — E785 Hyperlipidemia, unspecified: Secondary | ICD-10-CM | POA: Diagnosis not present

## 2020-08-24 LAB — COMPREHENSIVE METABOLIC PANEL
ALT: 202 U/L — ABNORMAL HIGH (ref 0–44)
AST: 148 U/L — ABNORMAL HIGH (ref 15–41)
Albumin: 2.5 g/dL — ABNORMAL LOW (ref 3.5–5.0)
Alkaline Phosphatase: 177 U/L — ABNORMAL HIGH (ref 38–126)
Anion gap: 14 (ref 5–15)
BUN: 70 mg/dL — ABNORMAL HIGH (ref 8–23)
CO2: 25 mmol/L (ref 22–32)
Calcium: 9 mg/dL (ref 8.9–10.3)
Chloride: 100 mmol/L (ref 98–111)
Creatinine, Ser: 2.88 mg/dL — ABNORMAL HIGH (ref 0.44–1.00)
GFR, Estimated: 15 mL/min — ABNORMAL LOW (ref >60)
Glucose, Bld: 88 mg/dL (ref 70–99)
Potassium: 5.9 mmol/L — ABNORMAL HIGH (ref 3.5–5.1)
Sodium: 139 mmol/L (ref 135–145)
Total Bilirubin: 0.5 mg/dL (ref 0.3–1.2)
Total Protein: 6 g/dL — ABNORMAL LOW (ref 6.5–8.1)

## 2020-08-24 LAB — CBC
HCT: 35.4 % — ABNORMAL LOW (ref 36.0–46.0)
Hemoglobin: 11.1 g/dL — ABNORMAL LOW (ref 12.0–15.0)
MCH: 29.7 pg (ref 26.0–34.0)
MCHC: 31.4 g/dL (ref 30.0–36.0)
MCV: 94.7 fL (ref 80.0–100.0)
Platelets: 343 10*3/uL (ref 150–400)
RBC: 3.74 MIL/uL — ABNORMAL LOW (ref 3.87–5.11)
RDW: 19.9 % — ABNORMAL HIGH (ref 11.5–15.5)
WBC: 15.2 10*3/uL — ABNORMAL HIGH (ref 4.0–10.5)
nRBC: 1.6 % — ABNORMAL HIGH (ref 0.0–0.2)

## 2020-08-24 LAB — URINE CULTURE: Culture: >=100000 — AB

## 2020-08-24 LAB — MAGNESIUM: Magnesium: 1.9 mg/dL (ref 1.7–2.4)

## 2020-08-24 MED ORDER — SODIUM ZIRCONIUM CYCLOSILICATE 10 G PO PACK
10.0000 g | PACK | Freq: Once | ORAL | Status: AC
  Administered 2020-08-24: 10 g via ORAL
  Filled 2020-08-24: qty 1

## 2020-08-24 MED ORDER — SODIUM CHLORIDE 0.9 % IV SOLN
1.5000 g | INTRAVENOUS | Status: DC
  Administered 2020-08-24: 1.5 g via INTRAVENOUS
  Filled 2020-08-24 (×2): qty 4

## 2020-08-24 NOTE — Progress Notes (Signed)
Patient returned from renal utrasound noted a skin tear area to  rt lower leg. Clean with NS and cover with dry dressing.

## 2020-08-24 NOTE — Progress Notes (Signed)
Patient is very sleepy, snoring hard to wake up but patient able to answer simple questions and went right back to sleep. Notified MD. New order noted.

## 2020-08-24 NOTE — Progress Notes (Addendum)
Patient sleeps most of today, would wake up when staff provide care but went back to sleep. Consumed no  meals today. Vital signs are stable. On 3lpm oxygen and Sat maintain 90-94%.

## 2020-08-24 NOTE — Progress Notes (Signed)
PROGRESS NOTE    Kristina [name]  [id number] DOB: [birth year] DOA: 08/24/2020 PCP: Holland Commons, FNP    Brief Narrative:  Kristina [name] is a 87 year old female with past medical history significant for CKD stage IIIb, solitary kidney, hyperlipidemia, essential hypertension, peripheral artery disease, iron deficiency anemia, peptic ulcer disease, AAA, chronic hypoxic respiratory failure on 5L [place] baseline who presented to the ED via EMS from [place] with complaints of shortness of breath and chest pain.  Upon EMS arrival, patient was found to be hypoxic with SPO2 in the 70s due to apparent oxygen machine malfunction.  Patient complained of sharp chest pain, 9/10 and was administered 1 nitroglycerin tablet and aspirin 324 mg orally.  Patient stated that onset of symptoms, roughly 2 days ago with mild nonproductive cough.  She denies any fevers, chills, night sweats, no nausea/vomiting/diarrhea, no abdominal pain.  Denies any recent sick contacts.  Patient has been vaccinated against COVID-19.    In the ED temperature 94.4 F, HR 76, RR 25, BP 159/90, SPO2 100% on 4 L nasal cannula.  Sodium 139, potassium 5.0, chloride 97, CO2 27, glucose 140, BUN 46, creatinine 1.77, BNP 752.6, troponin 372>477.  Lactic acid 2.9.  BBC 13.1, hemoglobin 7.1, MCV 100.4, platelets 563.  INR 1.0. SARS-CoV-2/COVID-19 PCR negative.  Chest x-ray with bilateral interstitial thickening and patchy alveolar opacities throughout right lung and mid interstitial thickening of the left lung with cardiomegaly concerning for multilobar pneumonia versus asymmetric pulmonary edema.  CT chest without contrast with moderate right and small left pleural effusions with associated atelectasis.  Patient was given 1 L LR bolus, started on vancomycin and cefepime.  Hospitalist service consulted for further evaluation and management.  During night of admission, patient started developing hypotension despite aggressive  resuscitation with IV fluids at 30 mL/kg.  PCCM was consulted for possible transfer to ICU for vasopressor initiation.  Patient was also noted to have a drop in her hemoglobin from 7.1-6.3 and was transfused 2 unit PRBC.  Patient was evaluated by Dr. Lake Bells, who noted that her blood pressure improved with blood transfusion and further discussion with family that patient would not want aggressive measures such as CPR or intubation and that patient was stable to remain on the hospitalist service.   Assessment & Plan:   Active Problems:   Atherosclerotic PVD with intermittent claudication - near occlusive SFA disease with moderate iliac disease   Essential hypertension   Hyperlipidemia with target low density lipoprotein (LDL) cholesterol less than 70 mg/dL   Chronic kidney disease (CKD), stage III (moderate) (HCC)   Protein-calorie malnutrition (HCC)   Lobar pneumonia (HCC)   Sepsis (HCC)   CAP (community acquired pneumonia)   Septic shock (HCC)   Severe sepsis (HCC)   Pressure injury of skin   Severe sepsis, present on admission Community-acquired pneumonia GNR Urinary tract infection Patient presenting from nursing facility with shortness of breath, cough.  Patient hypothermic with temperature 94.4 F and elevated WBC count of 13.1 on admission.  Urinalysis with moderate leukoesterase, negative nitrite, many bacteria and greater than 50 WBCs.  Lactic acid elevated 2.9 with procalcitonin 0.15.  Chest x-ray with findings consistent with multilobar pneumonia.  COVID-19 PCR negative.  Sputum obtain unacceptable for testing. --WBC 13.1>24.3>17.6>15.2 --Blood cultures x2: NG x 2 days --Urine culture: >100K Klebsiella pneumonia and Enterococcus faecalis --Azithromycin and ceftriaxone de-escalated to Unasyn 1.5 g IV q24h --Follow CBC daily  Acute on chronic hypoxic respiratory failure Patient was found  by EMS with SPO2 in the 70s, at baseline on 5 L nasal cannula.  Apparently oxygen machine at  SNF malfunctioning. --Currently on 3 L nasal cannula with SPO2 93%.  Symptomatic anemia Hx iron deficiency anemia Hx of peptic ulcer disease Patient presenting with hemoglobin 7.1.  Baseline 8.0-9.1. Hx of peptic ulcer disease, follows with GI, Dr. Benson Norway with EGD 07/07/2020 with few cratered nonbleeding duodenal ulcers.  Anemia panel with iron 50, TIBC 318, ferritin 91, B12 591, folate 16.1. --Hgb 7.1>6.3>10.4>10.6>11.1, stable --s/p 2u pRBC on 1/22 --Holding home aspirin --Continue home ferrous sulfate 325 mg p.o. daily --Protonix 40 mg IV q12h --cbc daily  Acute renal failure on CKD stage IIIb Baseline creatinine 1.2-1.4. Creatinine 1.77 on admission.  Etiology likely secondary to prerenal azotemia from dehydration versus ATN from sepsis and hypotension.  Renal ultrasound with marked atrophy left kidney, increased echogenicity of normal size right kidney consistent with medical renal disease and diffuse bladder wall thickening and irregularity. --Cr 1.77>2.18>2.56>2.88 --Hold home antihypertensives, renally dose all medications --Treatment for sepsis as above --NS at 82mL/hr --Strict I's and O's --Follow BMP daily  Hyperkalemia K 5.9 this morning. Etiology likely secondary to renal failure. --Lokelma 10 g p.o. BID today --Monitor on telemetry --Repeat BMP in a.m.  Elevated troponin likely secondary to type II demand ischemia Patient presenting with chest pain in the setting of hypoxia as her oxygen was malfunctioning at her SNF as well as anemia.  Troponin elevated at 372>477 on ED arrival.  With NSR, rate 80, QTc 439, no concerning ST elevation/depression or T wave inversions.  TTE with LVEF 65-70%, LV no regional wall motion normalities, RV moderately enlarged with severely elevated pulmonary artery pressure, LA severely dilated, RA severely dilated, IVC dilated. --Currently chest pain-free --Continue to monitor on telemetry  Elevated LFTs Etiology likely secondary to mild shock  liver from hypotension/severe sepsis as above. --AST [phone] --ALT 119>182>202 --Tbili 0.6 --Avoid hepatotoxins --follow LFTs daily  Essential hypertension Chronic diastolic congestive heart failure TTE w/ LVEF 65-70% --Holding home hydralazine --Strict I's and O's and daily weights --Monitor BP closely  Left anterior shin wound, POA --continue wound care per recommendations  Severe protein calorie malnutrition Body mass index is 19.35 kg/m.  Patient very thin, cachectic emaciated in appearance -- Nutrition consult for recommendations and supplementation   DVT prophylaxis: SCDs, holding chemical DVT prophylaxis due to profound anemia, concerning for GI bleed   Code Status: DNR Family Communication: No family present at bedside this morning, to contact patient's daughter, [name] via telephone this morning, unsuccessful.  Disposition Plan:  Level of care: Telemetry Medical Status is: Inpatient  Remains inpatient appropriate because:Hemodynamically unstable, Altered mental status, Ongoing diagnostic testing needed not appropriate for outpatient work up, Unsafe d/c plan, IV treatments appropriate due to intensity of illness or inability to take PO and Inpatient level of care appropriate due to severity of illness   Dispo: The patient is from: SNF              Anticipated d/c is to: SNF              Anticipated d/c date is: 3 days              Patient currently is not medically stable to d/c.   Difficult to place patient No   Consultants:   PCCM - signed off 1/22  Procedures:   TTE  Antimicrobials:   Vancomycin 1/21 - 1/23  Azithromycin 1/21>>  Ceftriaxone 1/21>>  Cefepime 1/21 -  1/21    Subjective: Patient seen and examined at bedside, sleeping but arousable.  Feels more tired today, received dose of Ultram overnight.  Hemoglobin remained stable.  White count slowly improving.  Urine culture positive for Enterococcus faecalis and Klebsiella pneumonia,  antibiotics deescalated with assistance of pharmacist.  No other complaints or concerns at this time.  No family present at bedside, unable to contact via telephone this morning.  Denies headache, no chest pain, no palpitations, no shortness of breath, no abdominal pain.  Nursing concerned about her sleepiness this morning, otherwise no acute concerns overnight or this morning per nursing staff  Objective: Vitals:   08/23/20 1513 08/23/20 2042 08/24/20 0345 08/24/20 0806  BP: 139/79 112/66 108/63 110/64  Pulse:  94 (!) 108 (!) 101  Resp:  15 15 15   Temp: 99.2 F (37.3 C) (!) 97.4 F (36.3 C) (!) 97.4 F (36.3 C) (!) 97.5 F (36.4 C)  TempSrc: Oral Oral Oral Oral  SpO2: 95% 91% 93% 95%  Weight:      Height:        Intake/Output Summary (Last 24 hours) at 08/24/2020 1237 Last data filed at 08/24/2020 1100 Gross per 24 hour  Intake 429.61 ml  Output --  Net 429.61 ml   Filed Weights   08/09/2020 1429  Weight: 42 kg    Examination:  General exam: Appears calm and comfortable, elderly/frail in appearance Respiratory system: Breath sounds slightly decreased right base with mild crackles, otherwise clear without wheezing, normal respiratory effort without accessory muscle use Cardiovascular system: S1 & S2 heard, RRR. No JVD, murmurs, rubs, gallops or clicks.  Bilateral ankle edema, nonpitting Gastrointestinal system: Abdomen is nondistended, soft and nontender. No organomegaly or masses felt. Normal bowel sounds heard. Central nervous system: Alert and oriented. No focal neurological deficits. Extremities: Symmetric 5 x 5 power. Skin: Left shin wound as depicted below Psychiatry: Judgement and insight appear normal. Mood & affect appropriate.        Data Reviewed: I have personally reviewed following labs and imaging studies  CBC: Recent Labs  Lab 08/22/2020 1500 08/22/20 0209 08/22/20 1003 08/22/20 1600 08/22/20 2100 08/23/20 0852 08/24/20 0428  WBC 13.1* 24.3*  --    --   --  17.6* 15.2*  NEUTROABS  --  22.5*  --   --   --   --   --   HGB 7.1* 6.3* 10.4* 10.5* 10.6* 10.6* 11.1*  HCT 24.2* 21.3* 33.9* 33.7* 33.3* 32.8* 35.4*  MCV 100.4* 100.0  --   --   --  91.9 94.7  PLT 563* 450*  --   --   --  339 [id number]   Basic Metabolic Panel: Recent Labs  Lab 08/05/2020 1500 08/22/20 1003 08/23/20 0214 08/24/20 0428  NA 139 136 135 139  K 5.0 6.0* 5.9* 5.9*  CL 97* 99 99 100  CO2 27 24 23 25   GLUCOSE 140* 107* 70 88  BUN 46* 47* 56* 70*  CREATININE 1.77* 2.18* 2.56* 2.88*  CALCIUM 9.3 8.7* 8.5* 9.0  MG  --   --  1.8 1.9   GFR: Estimated Creatinine Clearance: 8 mL/min (A) (by C-G formula based on SCr of 2.88 mg/dL (H)). Liver Function Tests: Recent Labs  Lab 08/17/2020 1715 08/23/20 0214 08/24/20 0428  AST 135* 156* 148*  ALT 119* 182* 202*  ALKPHOS 118 164* 177*  BILITOT 0.5 0.6 0.5  PROT 6.3* 5.5* 6.0*  ALBUMIN 3.0* 2.5* 2.5*   No results for input(s): LIPASE,  AMYLASE in the last 168 hours. No results for input(s): AMMONIA in the last 168 hours. Coagulation Profile: Recent Labs  Lab 08/14/2020 1500 08/22/20 1000  INR 1.0 1.3*   Cardiac Enzymes: No results for input(s): CKTOTAL, CKMB, CKMBINDEX, TROPONINI in the last 168 hours. BNP (last 3 results) No results for input(s): PROBNP in the last 8760 hours. HbA1C: No results for input(s): HGBA1C in the last 72 hours. CBG: Recent Labs  Lab 08/10/2020 2250  GLUCAP 79   Lipid Profile: No results for input(s): CHOL, HDL, LDLCALC, TRIG, CHOLHDL, LDLDIRECT in the last 72 hours. Thyroid Function Tests: No results for input(s): TSH, T4TOTAL, FREET4, T3FREE, THYROIDAB in the last 72 hours. Anemia Panel: Recent Labs    08/22/20 1000 08/22/20 1003  VITAMINB12  --  599  FOLATE  --  16.1  FERRITIN  --  91  TIBC  --  318  IRON  --  50  RETICCTPCT 3.2*  --    Sepsis Labs: Recent Labs  Lab 08/02/2020 1500 08/11/2020 1715  PROCALCITON  --  0.15  LATICACIDVEN 2.9* 2.8*    Recent Results (from  the past 240 hour(s))  Blood Culture (routine x 2)     Status: None (Preliminary result)   Collection Time: 08/24/2020  3:11 PM   Specimen: BLOOD  Result Value Ref Range Status   Specimen Description BLOOD BLOOD LEFT FOREARM  Final   Special Requests   Final    BOTTLES DRAWN AEROBIC AND ANAEROBIC Blood Culture adequate volume   Culture   Final    NO GROWTH 2 DAYS Performed at [facility], [place] [address] [place], [place] [address]    Report Status PENDING  Incomplete  SARS Coronavirus 2 by RT PCR (hospital order, performed in [facility] hospital lab) Nasopharyngeal Nasopharyngeal Swab     Status: None   Collection Time: 08/03/2020  3:11 PM   Specimen: Nasopharyngeal Swab  Result Value Ref Range Status   SARS Coronavirus 2 NEGATIVE NEGATIVE Final    Comment: (NOTE) SARS-CoV-2 target nucleic acids are NOT DETECTED.  The SARS-CoV-2 RNA is generally detectable in upper and lower respiratory specimens during the acute phase of infection. The lowest concentration of SARS-CoV-2 viral copies this assay can detect is 250 copies / mL. A negative result does not preclude SARS-CoV-2 infection and should not be used as the sole basis for treatment or other patient management decisions.  A negative result may occur with improper specimen collection / handling, submission of specimen other than nasopharyngeal swab, presence of viral mutation(s) within the areas targeted by this assay, and inadequate number of viral copies (<250 copies / mL). A negative result must be combined with clinical observations, patient history, and epidemiological information.  Fact Sheet for Patients:   StrictlyIdeas.no  Fact Sheet for Healthcare Providers: BankingDealers.co.za  This test is not yet approved or  cleared by the [place] FDA and has been authorized for detection and/or diagnosis of SARS-CoV-2 by FDA under an Emergency Use Authorization (EUA).   This EUA will remain in effect (meaning this test can be used) for the duration of the COVID-19 declaration under Section 564(b)(1) of the Act, 21 U.S.C. section 360bbb-3(b)(1), unless the authorization is terminated or revoked sooner.  Performed at [facility], [place] [address], [place], [place] [address]   Urine culture     Status: Abnormal   Collection Time: 08/04/2020  6:09 PM   Specimen: In/Out Cath Urine  Result Value Ref Range Status  Specimen Description IN/OUT CATH URINE  Final   Special Requests   Final    NONE Performed at [facility], [address] [place], [place] [address]    Culture (A)  Final    >=100,000 COLONIES/mL KLEBSIELLA PNEUMONIAE >=100,000 COLONIES/mL ENTEROCOCCUS FAECALIS    Report Status 08/24/2020 FINAL  Final   Organism ID, Bacteria KLEBSIELLA PNEUMONIAE (A)  Final   Organism ID, Bacteria ENTEROCOCCUS FAECALIS (A)  Final      Susceptibility   Enterococcus faecalis - MIC*    AMPICILLIN <=2 SENSITIVE Sensitive     NITROFURANTOIN <=16 SENSITIVE Sensitive     VANCOMYCIN 1 SENSITIVE Sensitive     * >=100,000 COLONIES/mL ENTEROCOCCUS FAECALIS   Klebsiella pneumoniae - MIC*    AMPICILLIN >=32 RESISTANT Resistant     CEFAZOLIN <=4 SENSITIVE Sensitive     CEFEPIME <=0.12 SENSITIVE Sensitive     CEFTRIAXONE <=0.25 SENSITIVE Sensitive     CIPROFLOXACIN <=0.25 SENSITIVE Sensitive     GENTAMICIN <=1 SENSITIVE Sensitive     IMIPENEM <=0.25 SENSITIVE Sensitive     NITROFURANTOIN 64 INTERMEDIATE Intermediate     TRIMETH/SULFA <=20 SENSITIVE Sensitive     AMPICILLIN/SULBACTAM 4 SENSITIVE Sensitive     PIP/TAZO <=4 SENSITIVE Sensitive     * >=100,000 COLONIES/mL KLEBSIELLA PNEUMONIAE  Sputum Culture     Status: None   Collection Time: 08/22/20  2:01 AM   Specimen: Expectorated Sputum  Result Value Ref Range Status   Specimen Description EXPECTORATED SPUTUM  Final   Special Requests NONE  Final   Sputum evaluation   Final    Sputum specimen  not acceptable for testing.  Please recollect.   RESULT CALLED TO, READ BACK BY AND VERIFIED WITH: Vincent PeyerM RUGGIERO RN 08/22/20 [id number] JDW Performed at [facility], [address] [place], [place] [address]    Report Status 08/22/2020 FINAL  Final  Blood Culture (routine x 2)     Status: None (Preliminary result)   Collection Time: 08/22/20 10:03 AM   Specimen: BLOOD  Result Value Ref Range Status   Specimen Description BLOOD LEFT ANTECUBITAL  Final   Special Requests   Final    BOTTLES DRAWN AEROBIC AND ANAEROBIC Blood Culture results may not be optimal due to an excessive volume of blood received in culture bottles   Culture   Final    NO GROWTH 1 DAY Performed at [facility], [address] [place], [place] [address]    Report Status PENDING  Incomplete  MRSA PCR Screening     Status: None   Collection Time: 08/22/20  2:33 PM   Specimen: Nasal Mucosa; Nasopharyngeal  Result Value Ref Range Status   MRSA by PCR NEGATIVE NEGATIVE Final    Comment:        The GeneXpert MRSA Assay (FDA approved for NASAL specimens only), is one component of a comprehensive MRSA colonization surveillance program. It is not intended to diagnose MRSA infection nor to guide or monitor treatment for MRSA infections. Performed at [facility], [address] [place], [place] [address]          Radiology Studies: US RENAL  Result Date: 08/24/2020 CLINICAL DATA:  Acute renal failure. EXAM: RENAL / URINARY TRACT ULTRASOUND COMPLETE COMPARISON:  CT of the abdomen and pelvis 12/09/2019 FINDINGS: Right Kidney: Renal measurements: 9.3 x 4.7 x 4.6 cm = volume: 105.2 ML. The renal parenchyma is isoechoic to the index organ, the liver. No stone or  mass lesion is present. No obstruction is present. Left Kidney: Renal measurements: 4.5 x 2.2 x 2.0 cm = volume: 10.2 mL. Kidneys markedly atrophic. Hypoechoic lesion near the upper pole likely represents a cyst measuring 1.1 x 1.1 x 1.0 cm Bladder:  Bladder wall is diffusely thickened. No definite mass lesion is present. Layering debris is evident. Other: None. IMPRESSION: 1. Marked atrophy left kidney. 2. Increased echogenicity of normal sized right kidney. This is nonspecific, but can be seen in the setting of medical renal disease. 3. Diffuse bladder wall thickening and irregularity. Question cystitis. Focal inflammation or neoplasm not excluded. Consider cystoscopy. Electronically Signed   By: San Morelle M.D.   On: 08/24/2020 12:18   ECHOCARDIOGRAM COMPLETE  Result Date: 08/23/2020    ECHOCARDIOGRAM REPORT   Patient Name:   Kristina [name] Date of Exam: 08/23/2020 Medical Rec #:  [id number]        Height:       58.0 in Accession #:    [id number]       Weight:       92.6 lb Date of Birth:  25-Apr-1926        BSA:          1.313 m Patient Age:    44 years         BP:           131/83 mmHg Patient Gender: F                HR:           100 bpm. Exam Location:  Inpatient Procedure: 2D Echo, Cardiac Doppler and Color Doppler Indications:    I50.9* Heart failure (unspecified)  History:        Patient has prior history of Echocardiogram examinations, most                 recent 11/14/2007.  Sonographer:    Merrie Roof RDCS Referring Phys: Gordon  1. Left ventricular ejection fraction, by estimation, is 65 to 70%. The left ventricle has normal function. The left ventricle has no regional wall motion abnormalities. Left ventricular diastolic function could not be evaluated.  2. Right ventricular systolic function is moderately reduced. The right ventricular size is moderately enlarged. There is severely elevated pulmonary artery systolic pressure. The estimated right ventricular systolic pressure is 75.1 mmHg.  3. Left atrial size was severely dilated.  4. Right atrial size was severely dilated.  5. The mitral valve is normal in structure. Mild mitral valve regurgitation. No evidence of mitral stenosis.  6. Tricuspid valve  regurgitation is moderate to severe.  7. The aortic valve is tricuspid. There is severe calcifcation of the aortic valve. There is moderate thickening of the aortic valve. Aortic valve regurgitation is not visualized. No aortic stenosis is present.  8. The inferior vena cava is dilated in size with <50% respiratory variability, suggesting right atrial pressure of 15 mmHg. FINDINGS  Left Ventricle: Left ventricular ejection fraction, by estimation, is 65 to 70%. The left ventricle has normal function. The left ventricle has no regional wall motion abnormalities. The left ventricular internal cavity size was normal in size. There is  no left ventricular hypertrophy. Left ventricular diastolic function could not be evaluated due to atrial fibrillation. Left ventricular diastolic function could not be evaluated. Right Ventricle: The right ventricular size is moderately enlarged. No increase in right ventricular wall thickness. Right ventricular systolic function is moderately reduced. There is severely  elevated pulmonary artery systolic pressure. The tricuspid regurgitant velocity is 3.68 m/s, and with an assumed right atrial pressure of 15 mmHg, the estimated right ventricular systolic pressure is 123456 mmHg. Left Atrium: Left atrial size was severely dilated. Right Atrium: Right atrial size was severely dilated. Pericardium: There is no evidence of pericardial effusion. Mitral Valve: The mitral valve is normal in structure. There is mild calcification of the anterior mitral valve leaflet(s). Mild mitral annular calcification. Mild mitral valve regurgitation. No evidence of mitral valve stenosis. Tricuspid Valve: The tricuspid valve is normal in structure. Tricuspid valve regurgitation is moderate to severe. No evidence of tricuspid stenosis. Aortic Valve: The aortic valve is tricuspid. There is severe calcifcation of the aortic valve. There is moderate thickening of the aortic valve. Aortic valve regurgitation is not  visualized. No aortic stenosis is present. Pulmonic Valve: The pulmonic valve was normal in structure. Pulmonic valve regurgitation is not visualized. No evidence of pulmonic stenosis. Aorta: The aortic root is normal in size and structure. Venous: The inferior vena cava is dilated in size with less than 50% respiratory variability, suggesting right atrial pressure of 15 mmHg. IAS/Shunts: No atrial level shunt detected by color flow Doppler.  LEFT VENTRICLE PLAX 2D LVIDd:         3.50 cm LVIDs:         2.30 cm LV PW:         1.00 cm LV IVS:        0.90 cm LVOT diam:     1.80 cm LV SV:         32 LV SV Index:   24 LVOT Area:     2.54 cm  LV Volumes (MOD) LV vol d, MOD A4C: 47.3 ml LV vol s, MOD A4C: 14.1 ml LV SV MOD A4C:     47.3 ml RIGHT VENTRICLE          IVC RV Basal diam:  4.60 cm  IVC diam: 2.60 cm RV Mid diam:    3.70 cm LEFT ATRIUM             Index       RIGHT ATRIUM           Index LA diam:        3.80 cm 2.89 cm/m  RA Area:     26.70 cm LA Vol (A2C):   63.3 ml 48.22 ml/m RA Volume:   87.40 ml  66.58 ml/m LA Vol (A4C):   75.6 ml 57.59 ml/m LA Biplane Vol: 72.1 ml 54.92 ml/m  AORTIC VALVE LVOT Vmax:   79.00 cm/s LVOT Vmean:  55.200 cm/s LVOT VTI:    0.124 m  AORTA Ao Root diam: 3.10 cm TRICUSPID VALVE TR Peak grad:   54.2 mmHg TR Vmax:        368.00 cm/s  SHUNTS Systemic VTI:  0.12 m Systemic Diam: 1.80 cm Fransico Him MD Electronically signed by Fransico Him MD Signature Date/Time: 08/23/2020/11:58:06 AM    Final         Scheduled Meds: . acetaminophen  500 mg Oral Q6H  . acetaminophen  650 mg Oral Once  . acidophilus  1 capsule Oral QPC lunch  . diphenhydrAMINE  25 mg Oral Once  . docusate sodium  100 mg Oral Daily  . feeding supplement  237 mL Oral BID BM  . ferrous sulfate  325 mg Oral Q breakfast  . pantoprazole (PROTONIX) IV  40 mg Intravenous Q12H   Continuous Infusions: .  sodium chloride 75 mL/hr at 08/23/20 0958  . ampicillin-sulbactam (UNASYN) IV       LOS: 3 days     Time spent: 39 minutes spent on chart review, discussion with nursing staff, consultants, updating family and interview/physical exam; more than 50% of that time was spent in counseling and/or coordination of care.    Steward Sames J [place] DO [facility] Available via Epic secure chat 7am-7pm After these hours, please refer to coverage provider listed on [email or web address] 08/24/2020, 12:37 PM

## 2020-08-24 NOTE — Progress Notes (Signed)
Initial Nutrition Assessment  DOCUMENTATION CODES:   Severe malnutrition in context of chronic illness  INTERVENTION:   -Continue Ensure Enlive po BID, each supplement provides 350 kcal and 20 grams of protein -MVI with minerals daily -Magic cup TID with meals, each supplement provides 290 kcal and 9 grams of protein -Feeding assistance with meals  NUTRITION DIAGNOSIS:   Severe Malnutrition related to chronic illness (PAD, CKD) as evidenced by severe fat depletion,severe muscle depletion.  GOAL:   Patient will meet greater than or equal to 90% of their needs  MONITOR:   PO intake,Supplement acceptance,Labs,Weight trends,Skin,I & O's  REASON FOR ASSESSMENT:   Consult Assessment of nutrition requirement/status  ASSESSMENT:   Kristina [name] is a 87 y.o. female with medical history significant of AAA, CKD stage III, hyperlipidemia, hypertension, PAD, iron deficiency anemia presents the emergency department today for shortness of breath and chest pain via EMS.  Patient lives at assisted living facility, was called by EMS due to hypoxia reading of 70% at facility.  When they went to facility, oxygen was not working properly and she was actually not on any oxygen and patient was at 70%, patient is normally on 5 L.  When they put her on 5 L of oxygen she was 100%.  They brought her here because she was still complaining of shortness of breath and chest pain.  Patient states that she started having chest pain 2 days ago, with shortness of breath.  Patient is a very poor historian, denies any other complaints at this time.  States that she could have had a small mild cough for the past couple of days.  Has been vaccinated against COVID.  Denies any fevers, chills, nausea, vomiting, abdominal pain.  Denies any sick contacts.  Denies any urinary symptoms, no other complaints at this time.  Patient is asking for pain medication due to her chest pain.  Per EMS they did give her 324 of aspirin and  1 sublingual nitro  Pt admitted with severe sepsis and CAP.   Reviewed I/O's: +430 ml x 24 hours and +6 L since admission  Pt sleeping soundly at time of visit. He did not arouse to voice or touch. Nurse tech in room attempted to wake pt to feed her, but was unsuccessful. Noted meal completion 0%. She has been refusing supplements and medications.  Reviewed wt hx; pt has had some wt gain, which may be related to edema.    Medications reviewed and include colace and ferrous sulfate.   Labs reviewed: K: 5.9.   NUTRITION - FOCUSED PHYSICAL EXAM:  Flowsheet Row Most Recent Value  Orbital Region Severe depletion  Upper Arm Region Severe depletion  Thoracic and Lumbar Region Severe depletion  Buccal Region Severe depletion  Temple Region Severe depletion  Clavicle Bone Region Severe depletion  Clavicle and Acromion Bone Region Severe depletion  Scapular Bone Region Severe depletion  Dorsal Hand Severe depletion  Patellar Region Severe depletion  Anterior Thigh Region Severe depletion  Posterior Calf Region Severe depletion  Edema (RD Assessment) Mild  Hair Reviewed  Eyes Reviewed  Mouth Reviewed  Skin Reviewed  Nails Reviewed       Diet Order:   Diet Order            Diet regular Room service appropriate? No; Fluid consistency: Thin  Diet effective now                 EDUCATION NEEDS:   Not appropriate for education at  this time  Skin:  Skin Assessment: Skin Integrity Issues: Skin Integrity Issues:: Stage III Stage III: sacrum  Last BM:  08/22/20  Height:   Ht Readings from Last 1 Encounters:  2020-09-06 4\' 10"  (1.473 m)    Weight:   Wt Readings from Last 1 Encounters:  09-06-2020 42 kg    Ideal Body Weight:  43.9 kg  BMI:  Body mass index is 19.35 kg/m.  Estimated Nutritional Needs:   Kcal:  1450-1650  Protein:  70-85 grams  Fluid:  > 1.4 L    Loistine Chance, RD, LDN, [place] Registered Dietitian II Certified Diabetes Care and Education  Specialist Please refer to [facility] for RD and/or RD on-call/weekend/after hours pager

## 2020-08-24 NOTE — Evaluation (Signed)
Occupational Therapy Evaluation Patient Details Name: Kristina [name] MRN: [id number] DOB: March 22, 1926 Today's Date: 08/24/2020    History of Present Illness Kristina [name] is a 87 y.o. female with pertinent past medical history of AAA, CKD stage III, hyperlipidemia, hypertension, PAD presents the emergency department today for shortness of breath and chest pain via EMS. Patient was found to have pneumonia and sepsis.   Clinical Impression   Pt presents with decline in function and safety with ADLs and ADL mobility. Prior to December 2021 pt lived at an ALF and was walking with a RW and was able to perform selfcare tasks. Pt has since been at a SNF and unsure of required assistance for ADLs/selfcare and has not walked since that time. Pt sat EOB with max and no c/o dizziness, however standing/OOB activity deferred due to confusion, fatigue/lethargy. Pt currently requires max - total A for ADLs/selfcare tasks. Pt would benefit from acute OT services to address impairments to maximize level of function and safety    Follow Up Recommendations  SNF;Supervision/Assistance - 24 hour    Equipment Recommendations  None recommended by OT    Recommendations for Other Services       Precautions / Restrictions Precautions Precautions: Fall Precaution Comments: watch O2, maybe check orthostatics Restrictions Weight Bearing Restrictions: No      Mobility Bed Mobility Overal bed mobility: Needs Assistance Bed Mobility: Supine to Sit;Sit to Supine     Supine to sit: Max assist Sit to supine: Max assist   General bed mobility comments: max A to manage LEs and trunk, positioning to [facility]    Transfers                 General transfer comment: deferred due to lethargy/fatigue    Balance Overall balance assessment: Needs assistance Sitting-balance support: Bilateral upper extremity supported;Feet unsupported Sitting balance-Leahy Scale: Fair Sitting balance - Comments: min A for  balance/support                                   ADL either performed or assessed with clinical judgement   ADL Overall ADL's : Needs assistance/impaired Eating/Feeding: Minimal assistance;Sitting;Bed level   Grooming: Wash/dry hands;Wash/dry face;Minimal assistance;Sitting   Upper Body Bathing: Maximal assistance   Lower Body Bathing: Total assistance   Upper Body Dressing : Maximal assistance   Lower Body Dressing: Total assistance     Toilet Transfer Details (indicate cue type and reason): deferred Toileting- Clothing Manipulation and Hygiene: Total assistance;Bed level         General ADL Comments: Pt required extensive assist ADL tasks seated EOB due to lethargy and fatigue, confusion. Deferred BSC transfers andn OOB acitivity this session.     Vision Patient Visual Report: No change from baseline       Perception     Praxis      Pertinent Vitals/Pain Pain Assessment: Faces Faces Pain Scale: Hurts little more Pain Location: neck Pain Descriptors / Indicators: Discomfort;Grimacing Pain Intervention(s): Limited activity within patient's tolerance;Monitored during session;Repositioned     Hand Dominance Right   Extremity/Trunk Assessment Upper Extremity Assessment Upper Extremity Assessment: Generalized weakness   Lower Extremity Assessment Lower Extremity Assessment: Defer to PT evaluation       Communication Communication Communication: HOH   Cognition Arousal/Alertness: Lethargic Behavior During Therapy: WFL for tasks assessed/performed Overall Cognitive Status: No family/caregiver present to determine baseline cognitive functioning Area of Impairment: Attention;Following commands;Awareness;Memory;Problem  solving                       Following Commands: Follows one step commands inconsistently     Problem Solving: Slow processing;Difficulty sequencing;Decreased initiation;Requires verbal cues;Requires tactile cues      General Comments       Exercises     Shoulder Instructions      Home Living Family/patient expects to be discharged to:: Skilled nursing facility                                        Prior Functioning/Environment Level of Independence: Needs assistance  Gait / Transfers Assistance Needed: pt has not walked since admission to SNF December 2021 per previous chart ADL's / Homemaking Assistance Needed: unknown of most current level of function at thit time, per previous chart, pt was doing ADLs/selfcare Ind prior to admission Decemebr 2021   Comments: Patient was performing ADLs and walking prior to being sent to [facility] Dece[facility] 2021        OT Problem List: Decreased strength;Impaired balance (sitting and/or standing);Decreased cognition;Pain;Decreased safety awareness;Decreased activity tolerance;Decreased coordination;Decreased knowledge of use of DME or AE      OT Treatment/Interventions: Self-care/ADL training;Therapeutic exercise;Patient/family education;DME and/or AE instruction;Balance training    OT Goals(Current goals can be found in the care plan section) Acute Rehab OT Goals Patient Stated Goal: get better OT Goal Formulation: With patient Time For Goal Achievement: 09/07/20 Potential to Achieve Goals: Good ADL Goals Pt Will Perform Grooming: with min guard assist;with supervision;with set-up;sitting Pt Will Perform Upper Body Bathing: with mod assist;with min assist;sitting Pt Will Perform Upper Body Dressing: with mod assist;with min assist;sitting Pt Will Transfer to Toilet: with max assist;with mod assist;stand pivot transfer;bedside commode Additional ADL Goal #1: Pt will complete bed mobility min A  to sit EOB in prep for selfcare and BSC transfers  OT Frequency: Min 2X/week   Barriers to D/C:            Co-evaluation              AM-PAC OT "6 Clicks" Daily Activity     Outcome Measure Help from another person eating meals?: A  Little Help from another person taking care of personal grooming?: A Lot Help from another person toileting, which includes using toliet, bedpan, or urinal?: Total Help from another person bathing (including washing, rinsing, drying)?: Total Help from another person to put on and taking off regular upper body clothing?: A Lot Help from another person to put on and taking off regular lower body clothing?: Total 6 Click Score: 10   End of Session    Activity Tolerance: Patient limited by fatigue;Patient limited by lethargy Patient left: in bed;with call bell/phone within reach;with bed alarm set  OT Visit Diagnosis: Other abnormalities of gait and mobility (R26.89);Muscle weakness (generalized) (M62.81);Other symptoms and signs involving cognitive function;Pain Pain - part of body:  (generalized, neck)                Time: 1140-1200 OT Time Calculation (min): 20 min Charges:  OT General Charges $OT Visit: 1 Visit OT Evaluation $OT Eval Moderate Complexity: 1 Mod    Britt Bottom 08/24/2020, 4:20 PM

## 2020-08-24 NOTE — Progress Notes (Signed)
Bladder scan done, 72ml noted.

## 2020-08-24 NOTE — Plan of Care (Signed)
  Problem: Activity: Goal: Risk for activity intolerance will decrease Outcome: Progressing   Problem: Elimination: Goal: Will not experience complications related to bowel motility Outcome: Progressing Goal: Will not experience complications related to urinary retention Outcome: Progressing   Problem: Safety: Goal: Ability to remain free from injury will improve Outcome: Progressing   Problem: Skin Integrity: Goal: Risk for impaired skin integrity will decrease Outcome: Progressing   Problem: Pain Managment: Goal: General experience of comfort will improve Outcome: Progressing

## 2020-08-25 DIAGNOSIS — R6521 Severe sepsis with septic shock: Secondary | ICD-10-CM

## 2020-08-25 DIAGNOSIS — A419 Sepsis, unspecified organism: Secondary | ICD-10-CM | POA: Diagnosis not present

## 2020-08-25 DIAGNOSIS — E785 Hyperlipidemia, unspecified: Secondary | ICD-10-CM | POA: Diagnosis not present

## 2020-08-25 DIAGNOSIS — E43 Unspecified severe protein-calorie malnutrition: Secondary | ICD-10-CM | POA: Diagnosis not present

## 2020-08-25 DIAGNOSIS — J181 Lobar pneumonia, unspecified organism: Secondary | ICD-10-CM | POA: Diagnosis not present

## 2020-08-25 LAB — COMPREHENSIVE METABOLIC PANEL
ALT: 278 U/L — ABNORMAL HIGH (ref 0–44)
AST: 277 U/L — ABNORMAL HIGH (ref 15–41)
Albumin: 2.4 g/dL — ABNORMAL LOW (ref 3.5–5.0)
Alkaline Phosphatase: 156 U/L — ABNORMAL HIGH (ref 38–126)
Anion gap: 19 — ABNORMAL HIGH (ref 5–15)
BUN: 85 mg/dL — ABNORMAL HIGH (ref 8–23)
CO2: 20 mmol/L — ABNORMAL LOW (ref 22–32)
Calcium: 8.7 mg/dL — ABNORMAL LOW (ref 8.9–10.3)
Chloride: 101 mmol/L (ref 98–111)
Creatinine, Ser: 3.33 mg/dL — ABNORMAL HIGH (ref 0.44–1.00)
GFR, Estimated: 12 mL/min — ABNORMAL LOW (ref >60)
Glucose, Bld: 97 mg/dL (ref 70–99)
Potassium: 6.8 mmol/L (ref 3.5–5.1)
Sodium: 140 mmol/L (ref 135–145)
Total Bilirubin: 0.7 mg/dL (ref 0.3–1.2)
Total Protein: 5.6 g/dL — ABNORMAL LOW (ref 6.5–8.1)

## 2020-08-25 LAB — MAGNESIUM: Magnesium: 2.1 mg/dL (ref 1.7–2.4)

## 2020-08-25 LAB — CBC
HCT: 35.7 % — ABNORMAL LOW (ref 36.0–46.0)
Hemoglobin: 10.4 g/dL — ABNORMAL LOW (ref 12.0–15.0)
MCH: 28.7 pg (ref 26.0–34.0)
MCHC: 29.1 g/dL — ABNORMAL LOW (ref 30.0–36.0)
MCV: 98.3 fL (ref 80.0–100.0)
Platelets: 307 10*3/uL (ref 150–400)
RBC: 3.63 MIL/uL — ABNORMAL LOW (ref 3.87–5.11)
RDW: 19.9 % — ABNORMAL HIGH (ref 11.5–15.5)
WBC: 14.8 10*3/uL — ABNORMAL HIGH (ref 4.0–10.5)
nRBC: 5.4 % — ABNORMAL HIGH (ref 0.0–0.2)

## 2020-08-25 LAB — GLUCOSE, CAPILLARY: Glucose-Capillary: 96 mg/dL (ref 70–99)

## 2020-08-25 MED ORDER — LORAZEPAM 2 MG/ML PO CONC: 1.0000 mg | ORAL | Status: DC | PRN

## 2020-08-25 MED ORDER — ONDANSETRON 4 MG PO TBDP: 4.0000 mg | ORAL_TABLET | Freq: Four times a day (QID) | ORAL | Status: DC | PRN

## 2020-08-25 MED ORDER — LORAZEPAM 2 MG/ML IJ SOLN
1.0000 mg | INTRAMUSCULAR | Status: DC | PRN
  Administered 2020-08-27 – 2020-08-28 (×4): 1 mg via INTRAVENOUS
  Filled 2020-08-25 (×4): qty 1

## 2020-08-25 MED ORDER — ONDANSETRON HCL 4 MG/2ML IJ SOLN: 4.0000 mg | Freq: Four times a day (QID) | INTRAMUSCULAR | Status: DC | PRN

## 2020-08-25 MED ORDER — DEXTROSE 50 % IV SOLN
1.0000 | Freq: Once | INTRAVENOUS | Status: DC
  Filled 2020-08-25: qty 50

## 2020-08-25 MED ORDER — MORPHINE SULFATE (PF) 2 MG/ML IV SOLN: 1.0000 mg | INTRAVENOUS | Status: DC | PRN

## 2020-08-25 MED ORDER — MORPHINE SULFATE (CONCENTRATE) 10 MG/0.5ML PO SOLN: 5.0000 mg | ORAL | Status: DC | PRN

## 2020-08-25 MED ORDER — SODIUM CHLORIDE 0.9 % IV BOLUS
250.0000 mL | Freq: Once | INTRAVENOUS | Status: AC
  Administered 2020-08-25: 250 mL via INTRAVENOUS

## 2020-08-25 MED ORDER — GLYCOPYRROLATE 0.2 MG/ML IJ SOLN
0.1000 mg | Freq: Four times a day (QID) | INTRAMUSCULAR | Status: DC | PRN
  Filled 2020-08-25: qty 0.5

## 2020-08-25 MED ORDER — LORAZEPAM 1 MG PO TABS: 1.0000 mg | ORAL_TABLET | ORAL | Status: DC | PRN

## 2020-08-25 MED ORDER — INSULIN ASPART 100 UNIT/ML IV SOLN: 5.0000 [IU] | Freq: Once | INTRAVENOUS | Status: DC

## 2020-08-25 MED ORDER — SODIUM POLYSTYRENE SULFONATE 15 GM/60ML PO SUSP
30.0000 g | Freq: Once | ORAL | Status: DC
  Filled 2020-08-25: qty 120

## 2020-08-25 MED ORDER — SODIUM BICARBONATE 8.4 % IV SOLN
50.0000 meq | Freq: Once | INTRAVENOUS | Status: AC
  Administered 2020-08-25: 50 meq via INTRAVENOUS
  Filled 2020-08-25: qty 50

## 2020-08-25 NOTE — Progress Notes (Signed)
PROGRESS NOTE    Kristina [name]  [id number] DOB: Dec 17, 1925 DOA: 08/27/2020 PCP: Holland Commons, FNP    Brief Narrative:  Kristina [name] is a 87 year old female with past medical history significant for CKD stage IIIb, solitary kidney, hyperlipidemia, essential hypertension, peripheral artery disease, iron deficiency anemia, peptic ulcer disease, AAA, chronic hypoxic respiratory failure on 5L [employer] baseline who presented to the ED via EMS from [place] with complaints of shortness of breath and chest pain.  Upon EMS arrival, patient was found to be hypoxic with SPO2 in the 70s due to apparent oxygen machine malfunction.  Patient complained of sharp chest pain, 9/10 and was administered 1 nitroglycerin tablet and aspirin 324 mg orally.  Patient stated that onset of symptoms, roughly 2 days ago with mild nonproductive cough.  She denies any fevers, chills, night sweats, no nausea/vomiting/diarrhea, no abdominal pain.  Denies any recent sick contacts.  Patient has been vaccinated against COVID-19.    In the ED temperature 94.4 F, HR 76, RR 25, BP 159/90, SPO2 100% on 4 L nasal cannula.  Sodium 139, potassium 5.0, chloride 97, CO2 27, glucose 140, BUN 46, creatinine 1.77, BNP 752.6, troponin 372>477.  Lactic acid 2.9.  BBC 13.1, hemoglobin 7.1, MCV 100.4, platelets 563.  INR 1.0. SARS-CoV-2/COVID-19 PCR negative.  Chest x-ray with bilateral interstitial thickening and patchy alveolar opacities throughout right lung and mid interstitial thickening of the left lung with cardiomegaly concerning for multilobar pneumonia versus asymmetric pulmonary edema.  CT chest without contrast with moderate right and small left pleural effusions with associated atelectasis.  Patient was given 1 L LR bolus, started on vancomycin and cefepime.  Hospitalist service consulted for further evaluation and management.  During night of admission, patient started developing hypotension despite aggressive  resuscitation with IV fluids at 30 mL/kg.  PCCM was consulted for possible transfer to ICU for vasopressor initiation.  Patient was also noted to have a drop in her hemoglobin from 7.1-6.3 and was transfused 2 unit PRBC.  Patient was evaluated by Dr. Lake Bells, who noted that her blood pressure improved with blood transfusion and further discussion with family that patient would not want aggressive measures such as CPR or intubation and that patient was stable to remain on the hospitalist service.   Assessment & Plan:   Active Problems:   Atherosclerotic PVD with intermittent claudication - near occlusive SFA disease with moderate iliac disease   Essential hypertension   Hyperlipidemia with target low density lipoprotein (LDL) cholesterol less than 70 mg/dL   Chronic kidney disease (CKD), stage III (moderate) (HCC)   Protein-calorie malnutrition (HCC)   Lobar pneumonia (HCC)   Sepsis (HCC)   CAP (community acquired pneumonia)   Septic shock (HCC)   Severe sepsis (HCC)   Pressure injury of skin   Severe sepsis, present on admission Community-acquired pneumonia GNR Urinary tract infection Patient presenting from nursing facility with shortness of breath, cough.  Patient hypothermic with temperature 94.4 F and elevated WBC count of 13.1 on admission.  Urinalysis with moderate leukoesterase, negative nitrite, many bacteria and greater than 50 WBCs.  Lactic acid elevated 2.9 with procalcitonin 0.15.  Chest x-ray with findings consistent with multilobar pneumonia.  COVID-19 PCR negative.  Sputum obtain unacceptable for testing.   Urine culture positive for Klebsiella pneumonia and Enterococcus faecalis. Patient was started on empiric antibiotics with azithromycin and ceftriaxone which was the escalated to Unasyn.   Patient's white blood cell count improved to 14.8.  Given patient's  continued decline, discussion with patient's daughter, Kristina [name] regarding poor prognosis; and was decided to transition care  to comfort measures at this time. --Continue supportive care with morphine PO/SL prn, ativan prn, glycopyrrolate as needed --Transition of care for residential hospice placement.  Acute on chronic hypoxic respiratory failure Patient was found by EMS with SPO2 in the 70s, at baseline on 5 L nasal cannula.  Apparently oxygen machine at SNF malfunctioning.  Now transition to comfort measures as above  Symptomatic anemia Hx iron deficiency anemia Hx of peptic ulcer disease Patient presenting with hemoglobin 7.1.  Baseline 8.0-9.1. Hx of peptic ulcer disease, follows with GI, Dr. Benson Norway with EGD 07/07/2020 with few cratered nonbleeding duodenal ulcers.  Anemia panel with iron 50, TIBC 318, ferritin 91, B12 591, folate 16.1.  Transfused 2 units PRBCs on 08/23/2019.  Hemoglobin remained stable following transfusion.  Now on comfort measures.  Acute renal failure on CKD stage IIIb Baseline creatinine 1.2-1.4. Creatinine 1.77 on admission.  Etiology likely secondary to prerenal azotemia from dehydration versus ATN from sepsis and hypotension.  Renal ultrasound with marked atrophy left kidney, increased echogenicity of normal size right kidney consistent with medical renal disease and diffuse bladder wall thickening and irregularity.  Patient's creatinine continued to worsen with increased BUN despite aggressive measures.  Patient now somnolent and transition to comfort measures.  Hyperkalemia Treated with IV fluids and Lokelma without improvement due to progressing renal failure.  Now transitioned patient to comfort measures.  Elevated troponin likely secondary to type II demand ischemia Patient presenting with chest pain in the setting of hypoxia as her oxygen was malfunctioning at her SNF as well as anemia.  Troponin elevated at 372>477 on ED arrival.  With NSR, rate 80, QTc 439, no concerning ST elevation/depression or T wave inversions.  TTE with LVEF 65-70%, LV no regional wall motion normalities, RV  moderately enlarged with severely elevated pulmonary artery pressure, LA severely dilated, RA severely dilated, IVC dilated.  Elevated LFTs Etiology likely secondary to mild shock liver from hypotension/severe sepsis as above.  Essential hypertension Chronic diastolic congestive heart failure TTE w/ LVEF 65-70%  Left anterior shin wound, POA  Severe protein calorie malnutrition Body mass index is 19.35 kg/m.  Patient very thin, cachectic emaciated in appearance   DVT prophylaxis: SCDs, on comfort measures   Code Status: DNR Family Communication: No family present at bedside this morning, updated patient's daughter via telephone who agrees with transition to comfort measures today  Disposition Plan:  Level of care: Telemetry Medical, discontinue telemetry Status is: Inpatient  Remains inpatient appropriate because:Hemodynamically unstable, Altered mental status, Ongoing diagnostic testing needed not appropriate for outpatient work up, Unsafe d/c plan, IV treatments appropriate due to intensity of illness or inability to take PO and Inpatient level of care appropriate due to severity of illness   Dispo: The patient is from: SNF              Anticipated d/c is to: Residential hospice              Anticipated d/c date is: 1 day              Patient currently is medically stable to d/c.   Difficult to place patient No   Consultants:   PCCM - signed off 1/22  Procedures:   TTE  Antimicrobials:   Vancomycin 1/21 - 1/23  Azithromycin 1/21 - 1/24  Ceftriaxone 1/21 -1/24  Cefepime 1/21 - 1/21  Unasyn 1/24 - 1/25  Subjective: Patient seen and examined at bedside, sleeping and unarousable.  Patient remains somnolent over the past 24 hours with no oral intake.  Discussed with patient's daughter via telephone this morning about continued decline despite aggressive measures; she agrees with transitioning care to comfort at this time.  No other questions or concerns at this  time.  Unable to obtain any further ROS from patient due to her encephalopathy.  No other acute concerns this morning per nursing staff.  Objective: Vitals:   08/24/20 0806 08/24/20 1457 08/24/20 1924 08/25/20 0559  BP: 110/64 110/68 118/74 107/76  Pulse: (!) 101 85 (!) 57 100  Resp: 15 17 18 12   Temp: (!) 97.5 F (36.4 C) 98.4 F (36.9 C) 98 F (36.7 C) 98.4 F (36.9 C)  TempSrc: Oral  Oral Oral  SpO2: 95% 93% 95% 91%  Weight:      Height:        Intake/Output Summary (Last 24 hours) at 08/25/2020 1101 Last data filed at 08/24/2020 1700 Gross per 24 hour  Intake 0 ml  Output --  Net 0 ml   Filed Weights   08/29/2020 1429  Weight: 42 kg    Examination:  General exam: No distress, obtunded, elderly/frail in appearance Respiratory system: Breath sounds slightly decreased right base with mild crackles, otherwise clear without wheezing, normal respiratory effort without accessory muscle use, on nasal cannula Cardiovascular system: S1 & S2 heard, RRR. No JVD, murmurs, rubs, gallops or clicks.  Bilateral ankle edema, nonpitting Gastrointestinal system: Abdomen is nondistended, soft and nontender. No organomegaly or masses felt. Normal bowel sounds heard. Central nervous system: Somnolent/obtunded Extremities: Will move extremities independently but not to command Skin: Left shin wound as depicted below Psychiatry: Unable to assess due to mental status       Data Reviewed: I have personally reviewed following labs and imaging studies  CBC: Recent Labs  Lab 08/08/2020 1500 08/22/20 0209 08/22/20 1003 08/22/20 1600 08/22/20 2100 08/23/20 0852 08/24/20 0428 08/25/20 0508  WBC 13.1* 24.3*  --   --   --  17.6* 15.2* 14.8*  NEUTROABS  --  22.5*  --   --   --   --   --   --   HGB 7.1* 6.3*   < > 10.5* 10.6* 10.6* 11.1* 10.4*  HCT 24.2* 21.3*   < > 33.7* 33.3* 32.8* 35.4* 35.7*  MCV 100.4* 100.0  --   --   --  91.9 94.7 98.3  PLT 563* 450*  --   --   --  339 343 307   < >  = values in this interval not displayed.   Basic Metabolic Panel: Recent Labs  Lab 08/17/2020 1500 08/22/20 1003 08/23/20 0214 08/24/20 0428 08/25/20 0508  NA 139 136 135 139 140  K 5.0 6.0* 5.9* 5.9* 6.8*  CL 97* 99 99 100 101  CO2 27 24 23 25  20*  GLUCOSE 140* 107* 70 88 97  BUN 46* 47* 56* 70* 85*  CREATININE 1.77* 2.18* 2.56* 2.88* 3.33*  CALCIUM 9.3 8.7* 8.5* 9.0 8.7*  MG  --   --  1.8 1.9 2.1   GFR: Estimated Creatinine Clearance: 7 mL/min (A) (by C-G formula based on SCr of 3.33 mg/dL (H)). Liver Function Tests: Recent Labs  Lab 08/07/2020 1715 08/23/20 0214 08/24/20 0428 08/25/20 0508  AST 135* 156* 148* 277*  ALT 119* 182* 202* 278*  ALKPHOS 118 164* 177* 156*  BILITOT 0.5 0.6 0.5 0.7  PROT 6.3* 5.5*  6.0* 5.6*  ALBUMIN 3.0* 2.5* 2.5* 2.4*   No results for input(s): LIPASE, AMYLASE in the last 168 hours. No results for input(s): AMMONIA in the last 168 hours. Coagulation Profile: Recent Labs  Lab 08/15/2020 1500 08/22/20 1000  INR 1.0 1.3*   Cardiac Enzymes: No results for input(s): CKTOTAL, CKMB, CKMBINDEX, TROPONINI in the last 168 hours. BNP (last 3 results) No results for input(s): PROBNP in the last 8760 hours. HbA1C: No results for input(s): HGBA1C in the last 72 hours. CBG: Recent Labs  Lab 08/20/2020 2250 08/25/20 0333  GLUCAP 79 96   Lipid Profile: No results for input(s): CHOL, HDL, LDLCALC, TRIG, CHOLHDL, LDLDIRECT in the last 72 hours. Thyroid Function Tests: No results for input(s): TSH, T4TOTAL, FREET4, T3FREE, THYROIDAB in the last 72 hours. Anemia Panel: No results for input(s): VITAMINB12, FOLATE, FERRITIN, TIBC, IRON, RETICCTPCT in the last 72 hours. Sepsis Labs: Recent Labs  Lab 08/12/2020 1500 08/27/2020 1715  PROCALCITON  --  0.15  LATICACIDVEN 2.9* 2.8*    Recent Results (from the past 240 hour(s))  Blood Culture (routine x 2)     Status: None (Preliminary result)   Collection Time: 08/10/2020  3:11 PM   Specimen: BLOOD   Result Value Ref Range Status   Specimen Description BLOOD BLOOD LEFT FOREARM  Final   Special Requests   Final    BOTTLES DRAWN AEROBIC AND ANAEROBIC Blood Culture adequate volume   Culture   Final    NO GROWTH 3 DAYS Performed at [facility], [place] [address], [place], [place] [address]    Report Status PENDING  Incomplete  SARS Coronavirus 2 by RT PCR (hospital order, performed in [facility] hospital lab) Nasopharyngeal Nasopharyngeal Swab     Status: None   Collection Time: 08/26/2020  3:11 PM   Specimen: Nasopharyngeal Swab  Result Value Ref Range Status   SARS Coronavirus 2 NEGATIVE NEGATIVE Final    Comment: (NOTE) SARS-CoV-2 target nucleic acids are NOT DETECTED.  The SARS-CoV-2 RNA is generally detectable in upper and lower respiratory specimens during the acute phase of infection. The lowest concentration of SARS-CoV-2 viral copies this assay can detect is 250 copies / mL. A negative result does not preclude SARS-CoV-2 infection and should not be used as the sole basis for treatment or other patient management decisions.  A negative result may occur with improper specimen collection / handling, submission of specimen other than nasopharyngeal swab, presence of viral mutation(s) within the areas targeted by this assay, and inadequate number of viral copies (<250 copies / mL). A negative result must be combined with clinical observations, patient history, and epidemiological information.  Fact Sheet for Patients:   StrictlyIdeas.no  Fact Sheet for Healthcare Providers: BankingDealers.co.za  This test is not yet approved or  cleared by the [place] FDA and has been authorized for detection and/or diagnosis of SARS-CoV-2 by FDA under an Emergency Use Authorization (EUA).  This EUA will remain in effect (meaning this test can be used) for the duration of the COVID-19 declaration under Section 564(b)(1) of the Act, 21  U.S.C. section 360bbb-3(b)(1), unless the authorization is terminated or revoked sooner.  Performed at [facility], [place] [address], [place], [place] [address]   Urine culture     Status: Abnormal   Collection Time: 08/20/2020  6:09 PM   Specimen: In/Out Cath Urine  Result Value Ref Range Status   Specimen Description IN/OUT CATH URINE  Final   Special Requests  Final    NONE Performed at [facility], [place] [address], [place], [place] [address]    Culture (A)  Final    >=100,000 COLONIES/mL KLEBSIELLA PNEUMONIAE >=100,000 COLONIES/mL ENTEROCOCCUS FAECALIS    Report Status 08/24/2020 FINAL  Final   Organism ID, Bacteria KLEBSIELLA PNEUMONIAE (A)  Final   Organism ID, Bacteria ENTEROCOCCUS FAECALIS (A)  Final      Susceptibility   Enterococcus faecalis - MIC*    AMPICILLIN <=2 SENSITIVE Sensitive     NITROFURANTOIN <=16 SENSITIVE Sensitive     VANCOMYCIN 1 SENSITIVE Sensitive     * >=100,000 COLONIES/mL ENTEROCOCCUS FAECALIS   Klebsiella pneumoniae - MIC*    AMPICILLIN >=32 RESISTANT Resistant     CEFAZOLIN <=4 SENSITIVE Sensitive     CEFEPIME <=0.12 SENSITIVE Sensitive     CEFTRIAXONE <=0.25 SENSITIVE Sensitive     CIPROFLOXACIN <=0.25 SENSITIVE Sensitive     GENTAMICIN <=1 SENSITIVE Sensitive     IMIPENEM <=0.25 SENSITIVE Sensitive     NITROFURANTOIN 64 INTERMEDIATE Intermediate     TRIMETH/SULFA <=20 SENSITIVE Sensitive     AMPICILLIN/SULBACTAM 4 SENSITIVE Sensitive     PIP/TAZO <=4 SENSITIVE Sensitive     * >=100,000 COLONIES/mL KLEBSIELLA PNEUMONIAE  Sputum Culture     Status: None   Collection Time: 08/22/20  2:01 AM   Specimen: Expectorated Sputum  Result Value Ref Range Status   Specimen Description EXPECTORATED SPUTUM  Final   Special Requests NONE  Final   Sputum evaluation   Final    Sputum specimen not acceptable for testing.  Please recollect.   RESULT CALLED TO, READ BACK BY AND VERIFIED WITH: Elige Radon RN 08/22/20 [id number] Performed at [facility], [address] [place], [place] [address]    Report Status 08/22/2020 FINAL  Final  Blood Culture (routine x 2)     Status: None (Preliminary result)   Collection Time: 08/22/20 10:03 AM   Specimen: BLOOD  Result Value Ref Range Status   Specimen Description BLOOD LEFT ANTECUBITAL  Final   Special Requests   Final    BOTTLES DRAWN AEROBIC AND ANAEROBIC Blood Culture results may not be optimal due to an excessive volume of blood received in culture bottles   Culture   Final    NO GROWTH 2 DAYS Performed at [facility], [place] [address] [place], [place] [address]    Report Status PENDING  Incomplete  MRSA PCR Screening     Status: None   Collection Time: 08/22/20  2:33 PM   Specimen: Nasal Mucosa; Nasopharyngeal  Result Value Ref Range Status   MRSA by PCR NEGATIVE NEGATIVE Final    Comment:        The GeneXpert MRSA Assay (FDA approved for NASAL specimens only), is one component of a comprehensive MRSA colonization surveillance program. It is not intended to diagnose MRSA infection nor to guide or monitor treatment for MRSA infections. Performed at [facility], [place] [address] [place], [place] [address]          Radiology Studies: US RENAL  Result Date: 08/24/2020 CLINICAL DATA:  Acute renal failure. EXAM: RENAL / URINARY TRACT ULTRASOUND COMPLETE COMPARISON:  CT of the abdomen and pelvis 12/09/2019 FINDINGS: Right Kidney: Renal measurements: 9.3 x 4.7 x 4.6 cm = volume: 105.2 ML. The renal parenchyma is isoechoic to the index organ, the liver. No stone or mass lesion is present. No obstruction is present. Left Kidney: Renal measurements: 4.5  x 2.2 x 2.0 cm = volume: 10.2 mL. Kidneys markedly atrophic. Hypoechoic lesion near the upper pole likely represents a cyst measuring 1.1 x 1.1 x 1.0 cm Bladder: Bladder wall is diffusely thickened. No definite mass lesion is present. Layering debris is evident. Other: None. IMPRESSION: 1. Marked atrophy left  kidney. 2. Increased echogenicity of normal sized right kidney. This is nonspecific, but can be seen in the setting of medical renal disease. 3. Diffuse bladder wall thickening and irregularity. Question cystitis. Focal inflammation or neoplasm not excluded. Consider cystoscopy. Electronically Signed   By: San Morelle M.D.   On: 08/24/2020 12:18   ECHOCARDIOGRAM COMPLETE  Result Date: 08/23/2020    ECHOCARDIOGRAM REPORT   Patient Name:   Kristina [name] Date of Exam: 08/23/2020 Medical Rec #:  [id number]        Height:       58.0 in Accession #:    [id number]       Weight:       92.6 lb Date of Birth:  Jun 28, 1926        BSA:          1.313 m Patient Age:    73 years         BP:           131/83 mmHg Patient Gender: F                HR:           100 bpm. Exam Location:  Inpatient Procedure: 2D Echo, Cardiac Doppler and Color Doppler Indications:    I50.9* Heart failure (unspecified)  History:        Patient has prior history of Echocardiogram examinations, most                 recent 11/14/2007.  Sonographer:    Merrie Roof RDCS Referring Phys: [place]  1. Left ventricular ejection fraction, by estimation, is 65 to 70%. The left ventricle has normal function. The left ventricle has no regional wall motion abnormalities. Left ventricular diastolic function could not be evaluated.  2. Right ventricular systolic function is moderately reduced. The right ventricular size is moderately enlarged. There is severely elevated pulmonary artery systolic pressure. The estimated right ventricular systolic pressure is 123456 mmHg.  3. Left atrial size was severely dilated.  4. Right atrial size was severely dilated.  5. The mitral valve is normal in structure. Mild mitral valve regurgitation. No evidence of mitral stenosis.  6. Tricuspid valve regurgitation is moderate to severe.  7. The aortic valve is tricuspid. There is severe calcifcation of the aortic valve. There is moderate thickening of  the aortic valve. Aortic valve regurgitation is not visualized. No aortic stenosis is present.  8. The inferior vena cava is dilated in size with <50% respiratory variability, suggesting right atrial pressure of 15 mmHg. FINDINGS  Left Ventricle: Left ventricular ejection fraction, by estimation, is 65 to 70%. The left ventricle has normal function. The left ventricle has no regional wall motion abnormalities. The left ventricular internal cavity size was normal in size. There is  no left ventricular hypertrophy. Left ventricular diastolic function could not be evaluated due to atrial fibrillation. Left ventricular diastolic function could not be evaluated. Right Ventricle: The right ventricular size is moderately enlarged. No increase in right ventricular wall thickness. Right ventricular systolic function is moderately reduced. There is severely elevated pulmonary artery systolic pressure. The tricuspid regurgitant velocity is 3.68 m/s, and  with an assumed right atrial pressure of 15 mmHg, the estimated right ventricular systolic pressure is 47.8 mmHg. Left Atrium: Left atrial size was severely dilated. Right Atrium: Right atrial size was severely dilated. Pericardium: There is no evidence of pericardial effusion. Mitral Valve: The mitral valve is normal in structure. There is mild calcification of the anterior mitral valve leaflet(s). Mild mitral annular calcification. Mild mitral valve regurgitation. No evidence of mitral valve stenosis. Tricuspid Valve: The tricuspid valve is normal in structure. Tricuspid valve regurgitation is moderate to severe. No evidence of tricuspid stenosis. Aortic Valve: The aortic valve is tricuspid. There is severe calcifcation of the aortic valve. There is moderate thickening of the aortic valve. Aortic valve regurgitation is not visualized. No aortic stenosis is present. Pulmonic Valve: The pulmonic valve was normal in structure. Pulmonic valve regurgitation is not visualized. No  evidence of pulmonic stenosis. Aorta: The aortic root is normal in size and structure. Venous: The inferior vena cava is dilated in size with less than 50% respiratory variability, suggesting right atrial pressure of 15 mmHg. IAS/Shunts: No atrial level shunt detected by color flow Doppler.  LEFT VENTRICLE PLAX 2D LVIDd:         3.50 cm LVIDs:         2.30 cm LV PW:         1.00 cm LV IVS:        0.90 cm LVOT diam:     1.80 cm LV SV:         32 LV SV Index:   24 LVOT Area:     2.54 cm  LV Volumes (MOD) LV vol d, MOD A4C: 47.3 ml LV vol s, MOD A4C: 14.1 ml LV SV MOD A4C:     47.3 ml RIGHT VENTRICLE          IVC RV Basal diam:  4.60 cm  IVC diam: 2.60 cm RV Mid diam:    3.70 cm LEFT ATRIUM             Index       RIGHT ATRIUM           Index LA diam:        3.80 cm 2.89 cm/m  RA Area:     26.70 cm LA Vol (A2C):   63.3 ml 48.22 ml/m RA Volume:   87.40 ml  66.58 ml/m LA Vol (A4C):   75.6 ml 57.59 ml/m LA Biplane Vol: 72.1 ml 54.92 ml/m  AORTIC VALVE LVOT Vmax:   79.00 cm/s LVOT Vmean:  55.200 cm/s LVOT VTI:    0.124 m  AORTA Ao Root diam: 3.10 cm TRICUSPID VALVE TR Peak grad:   54.2 mmHg TR Vmax:        368.00 cm/s  SHUNTS Systemic VTI:  0.12 m Systemic Diam: 1.80 cm Fransico Him MD Electronically signed by Fransico Him MD Signature Date/Time: 08/23/2020/11:58:06 AM    Final         Scheduled Meds: . acetaminophen  500 mg Oral Q6H  . acetaminophen  650 mg Oral Once   Continuous Infusions:    LOS: 4 days    Time spent: 36 minutes spent on chart review, discussion with nursing staff, consultants, updating family and interview/physical exam; more than 50% of that time was spent in counseling and/or coordination of care.    Arth Nicastro J [place] DO [facility] Available via Epic secure chat 7am-7pm After these hours, please refer to coverage provider listed on [email or web address] 08/25/2020, 11:01 AM

## 2020-08-25 NOTE — Plan of Care (Signed)
  Problem: Education: Goal: Knowledge of General Education information will improve Description: Including pain rating scale, medication(s)/side effects and non-pharmacologic comfort measures Outcome: Not Progressing   Problem: Health Behavior/Discharge Planning: Goal: Ability to manage health-related needs will improve Outcome: Not Progressing   Problem: Clinical Measurements: Goal: Ability to maintain clinical measurements within normal limits will improve Outcome: Not Progressing  Pt. Deeply asleep all night

## 2020-08-25 NOTE — Progress Notes (Signed)
Manufacturing engineer [facility]) Hospital Liaison note.    Received request from [place] for family interest in [facility]. Chart and pt information under review by [facility] physician.  Hospice eligibility pending at this time. Spoke with daughter [name] by phone to confirm interest.  [facility] is unable to offer a room today. Hospital Liaison will follow up tomorrow or sooner if a room becomes available. Please do not hesitate to call with questions.    Thank you for the opportunity to participate in this patient's care.  Domenic Moras, BSN, RN [facility] Liaison (listed on [place] under Hospice/[facility])    [phone] (24h on call)

## 2020-08-25 NOTE — Plan of Care (Signed)
  Problem: Pain Managment: Goal: General experience of comfort will improve Outcome: Progressing   Problem: Safety: Goal: Ability to remain free from injury will improve Outcome: Progressing   Problem: Skin Integrity: Goal: Risk for impaired skin integrity will decrease Outcome: Progressing   

## 2020-08-25 NOTE — Progress Notes (Signed)
CRITICAL VALUE ALERT  Critical Value:  Potasium 6.8  Date & Time Notied: 08/25/20  At 0530  Provider Notified: yes paged @ [phone]  Orders Received/Actions taken:waitng for orders

## 2020-08-25 NOTE — Progress Notes (Signed)
Patient status keep changing as she has been sleeping all night, did not eat or drink anything, vital signs normal except HR which has been in 130s on tele monitor and respiration keep trending down from 18 in the beginning of the shift to 10 now, has not voided since shift, deeply asleep but arousable by touch and voice, attending provider paged via Amion, will continue to monitor and wait for orders.

## 2020-08-26 DIAGNOSIS — J189 Pneumonia, unspecified organism: Secondary | ICD-10-CM | POA: Diagnosis not present

## 2020-08-26 DIAGNOSIS — N390 Urinary tract infection, site not specified: Secondary | ICD-10-CM

## 2020-08-26 DIAGNOSIS — A419 Sepsis, unspecified organism: Secondary | ICD-10-CM | POA: Diagnosis not present

## 2020-08-26 DIAGNOSIS — E43 Unspecified severe protein-calorie malnutrition: Secondary | ICD-10-CM | POA: Diagnosis not present

## 2020-08-26 DIAGNOSIS — I70219 Atherosclerosis of native arteries of extremities with intermittent claudication, unspecified extremity: Secondary | ICD-10-CM | POA: Diagnosis not present

## 2020-08-26 LAB — CULTURE, BLOOD (ROUTINE X 2)
Culture: NO GROWTH
Special Requests: ADEQUATE

## 2020-08-26 LAB — SARS CORONAVIRUS 2 (TAT 6-24 HRS): SARS Coronavirus 2: NEGATIVE

## 2020-08-26 NOTE — Progress Notes (Signed)
PROGRESS NOTE    Kristina [name]  [id number] DOB: [birth year] DOA: 08/11/2020 PCP: Holland Commons, FNP    Brief Narrative:  Kristina [name] is a 87 year old female with past medical history significant for CKD stage IIIb, solitary kidney, hyperlipidemia, essential hypertension, peripheral artery disease, iron deficiency anemia, peptic ulcer disease, AAA, chronic hypoxic respiratory failure on 5L [place] baseline who presented to the ED via EMS from [place] with complaints of shortness of breath and chest pain.  Upon EMS arrival, patient was found to be hypoxic with SPO2 in the 70s due to apparent oxygen machine malfunction.  Patient complained of sharp chest pain, 9/10 and was administered 1 nitroglycerin tablet and aspirin 324 mg orally.  Patient stated that onset of symptoms, roughly 2 days ago with mild nonproductive cough.  She denies any fevers, chills, night sweats, no nausea/vomiting/diarrhea, no abdominal pain.  Denies any recent sick contacts.  Patient has been vaccinated against COVID-19.    In the ED temperature 94.4 F, HR 76, RR 25, BP 159/90, SPO2 100% on 4 L nasal cannula.  Sodium 139, potassium 5.0, chloride 97, CO2 27, glucose 140, BUN 46, creatinine 1.77, BNP 752.6, troponin 372>477.  Lactic acid 2.9.  BBC 13.1, hemoglobin 7.1, MCV 100.4, platelets 563.  INR 1.0. SARS-CoV-2/COVID-19 PCR negative.  Chest x-ray with bilateral interstitial thickening and patchy alveolar opacities throughout right lung and mid interstitial thickening of the left lung with cardiomegaly concerning for multilobar pneumonia versus asymmetric pulmonary edema.  CT chest without contrast with moderate right and small left pleural effusions with associated atelectasis.  Patient was given 1 L LR bolus, started on vancomycin and cefepime.  Hospitalist service consulted for further evaluation and management.  During night of admission, patient started developing hypotension despite aggressive  resuscitation with IV fluids at 30 mL/kg.  PCCM was consulted for possible transfer to ICU for vasopressor initiation.  Patient was also noted to have a drop in her hemoglobin from 7.1-6.3 and was transfused 2 unit PRBC.  Patient was evaluated by Dr. Lake Bells, who noted that her blood pressure improved with blood transfusion and further discussion with family that patient would not want aggressive measures such as CPR or intubation and that patient was stable to remain on the hospitalist service.   Assessment & Plan:   Active Problems:   Atherosclerotic PVD with intermittent claudication - near occlusive SFA disease with moderate iliac disease   Essential hypertension   Hyperlipidemia with target low density lipoprotein (LDL) cholesterol less than 70 mg/dL   Chronic kidney disease (CKD), stage III (moderate) (HCC)   Protein-calorie malnutrition (HCC)   Lobar pneumonia (HCC)   Sepsis (HCC)   CAP (community acquired pneumonia)   Septic shock (HCC)   Severe sepsis (HCC)   Pressure injury of skin   Severe sepsis, present on admission Community-acquired pneumonia GNR Urinary tract infection Patient presenting from nursing facility with shortness of breath, cough.  Patient hypothermic with temperature 94.4 F and elevated WBC count of 13.1 on admission.  Urinalysis with moderate leukoesterase, negative nitrite, many bacteria and greater than 50 WBCs.  Lactic acid elevated 2.9 with procalcitonin 0.15.  Chest x-ray with findings consistent with multilobar pneumonia.  COVID-19 PCR negative.  Sputum obtain unacceptable for testing.   Urine culture positive for Klebsiella pneumonia and Enterococcus faecalis. Patient was started on empiric antibiotics with azithromycin and ceftriaxone which was the escalated to Unasyn.   Patient's white blood cell count improved to 14.8.  Given patient's  continued decline, discussion with patient's daughter, [name] regarding poor prognosis; and was decided to transition care  to comfort measures at this time. --Continue supportive care with morphine PO/SL prn, ativan prn, glycopyrrolate as needed --Pending bed availability at [facility] residential hospice  Acute on chronic hypoxic respiratory failure Patient was found by EMS with SPO2 in the 70s, at baseline on 5 L nasal cannula.  Apparently oxygen machine at SNF malfunctioning.  Now transitioned to comfort measures as above  Symptomatic anemia Hx iron deficiency anemia Hx of peptic ulcer disease Patient presenting with hemoglobin 7.1.  Baseline 8.0-9.1. Hx of peptic ulcer disease, follows with GI, Dr. Benson Norway with EGD 07/07/2020 with few cratered nonbleeding duodenal ulcers.  Anemia panel with iron 50, TIBC 318, ferritin 91, B12 591, folate 16.1.  Transfused 2 units PRBCs on 08/23/2019.  Hemoglobin remained stable following transfusion.  Now on comfort measures.  Acute renal failure on CKD stage IIIb Baseline creatinine 1.2-1.4. Creatinine 1.77 on admission.  Etiology likely secondary to prerenal azotemia from dehydration versus ATN from sepsis and hypotension.  Renal ultrasound with marked atrophy left kidney, increased echogenicity of normal size right kidney consistent with medical renal disease and diffuse bladder wall thickening and irregularity.  Patient's creatinine continued to worsen with increased BUN despite aggressive measures.  Patient now somnolent and transition to comfort measures.  Hyperkalemia Treated with IV fluids and Lokelma without improvement due to progressing renal failure.  Now transitioned patient to comfort measures.  Elevated troponin likely secondary to type II demand ischemia Patient presenting with chest pain in the setting of hypoxia as her oxygen was malfunctioning at her SNF as well as anemia.  Troponin elevated at 372>477 on ED arrival.  With NSR, rate 80, QTc 439, no concerning ST elevation/depression or T wave inversions.  TTE with LVEF 65-70%, LV no regional wall motion  normalities, RV moderately enlarged with severely elevated pulmonary artery pressure, LA severely dilated, RA severely dilated, IVC dilated.  Elevated LFTs Etiology likely secondary to mild shock liver from hypotension/severe sepsis as above.  Essential hypertension Chronic diastolic congestive heart failure TTE w/ LVEF 65-70%  Left anterior shin wound, POA  Severe protein calorie malnutrition Body mass index is 19.35 kg/m.  Patient very thin, cachectic emaciated in appearance   DVT prophylaxis: SCDs, on comfort measures   Code Status: DNR Family Communication: No family present at bedside this morning, updated patient's daughter via telephone who agrees with transition to comfort measures today  Disposition Plan:  Level of care: Telemetry Medical, discontinue telemetry Status is: Inpatient  Remains inpatient appropriate because:Hemodynamically unstable, Altered mental status, Ongoing diagnostic testing needed not appropriate for outpatient work up, Unsafe d/c plan, IV treatments appropriate due to intensity of illness or inability to take PO and Inpatient level of care appropriate due to severity of illness   Dispo: The patient is from: SNF              Anticipated d/c is to: Residential hospice [facility] Bed pending              Anticipated d/c date is: 1 day              Patient currently is medically stable to d/c.   Difficult to place patient No   Consultants:   PCCM - signed off 1/22  Procedures:   TTE  Antimicrobials:   Vancomycin 1/21 - 1/23  Azithromycin 1/21 - 1/24  Ceftriaxone 1/21 -1/24  Cefepime 1/21 - 1/21  Unasyn  1/24 - 1/25    Subjective: Patient seen and examined at bedside, sleeping and unarousable. Nurse tech present in room. No other questions or concerns at this time.  Unable to obtain any further ROS from patient due to her encephalopathy.  No other acute concerns this morning per nursing staff. Awaiting placement at [facility] hosp[facility], no bed available today.  Objective: Vitals:   08/24/20 1457 08/24/20 1924 08/25/20 0559 08/25/20 2000  BP: 110/68 118/74 107/76 118/66  Pulse: 85 (!) 57 100 (!) 104  Resp: 17 18 12    Temp: 98.4 F (36.9 C) 98 F (36.7 C) 98.4 F (36.9 C) 99.3 F (37.4 C)  TempSrc:  Oral Oral Axillary  SpO2: 93% 95% 91% 96%  Weight:      Height:       No intake or output data in the 24 hours ending 08/26/20 1104 Filed Weights   08/29/2020 1429  Weight: 42 kg    Examination:  General exam: No distress, obtunded, elderly/frail in appearance Respiratory system: Breath sounds slightly decreased right base with mild crackles, otherwise clear without wheezing, normal respiratory effort without accessory muscle use, on 4L nasal cannula Cardiovascular system: S1 & S2 heard, RRR. No JVD, murmurs, rubs, gallops or clicks.  Bilateral ankle edema, nonpitting Gastrointestinal system: Abdomen is nondistended, soft and nontender. No organomegaly or masses felt. Normal bowel sounds heard. Central nervous system: Somnolent/obtunded Extremities: Will move extremities independently but not to command Skin: Left shin wound as depicted below Psychiatry: Unable to assess due to mental status       Data Reviewed: I have personally reviewed following labs and imaging studies  CBC: Recent Labs  Lab 08/29/2020 1500 08/22/20 0209 08/22/20 1003 08/22/20 1600 08/22/20 2100 08/23/20 0852 08/24/20 0428 08/25/20 0508  WBC 13.1* 24.3*  --   --   --  17.6* 15.2* 14.8*  NEUTROABS  --  22.5*  --   --   --   --   --   --   HGB 7.1* 6.3*   < > 10.5* 10.6* 10.6* 11.1* 10.4*  HCT 24.2* 21.3*   < > 33.7* 33.3* 32.8* 35.4* 35.7*  MCV 100.4* 100.0  --   --   --  91.9 94.7 98.3  PLT 563* 450*  --   --   --  339 343 307   < > = values in this interval not displayed.   Basic Metabolic Panel: Recent Labs  Lab 08/13/2020 1500 08/22/20 1003 08/23/20 0214 08/24/20 0428 08/25/20 0508  NA 139 136 135  139 140  K 5.0 6.0* 5.9* 5.9* 6.8*  CL 97* 99 99 100 101  CO2 27 24 23 25  20*  GLUCOSE 140* 107* 70 88 97  BUN 46* 47* 56* 70* 85*  CREATININE 1.77* 2.18* 2.56* 2.88* 3.33*  CALCIUM 9.3 8.7* 8.5* 9.0 8.7*  MG  --   --  1.8 1.9 2.1   GFR: Estimated Creatinine Clearance: 7 mL/min (A) (by C-G formula based on SCr of 3.33 mg/dL (H)). Liver Function Tests: Recent Labs  Lab 08/23/2020 1715 08/23/20 0214 08/24/20 0428 08/25/20 0508  AST 135* 156* 148* 277*  ALT 119* 182* 202* 278*  ALKPHOS 118 164* 177* 156*  BILITOT 0.5 0.6 0.5 0.7  PROT 6.3* 5.5* 6.0* 5.6*  ALBUMIN 3.0* 2.5* 2.5* 2.4*   No results for input(s): LIPASE, AMYLASE in the last 168 hours. No results for input(s): AMMONIA in the last 168 hours. Coagulation Profile: Recent Labs  Lab  08/15/2020 1500 08/22/20 1000  INR 1.0 1.3*   Cardiac Enzymes: No results for input(s): CKTOTAL, CKMB, CKMBINDEX, TROPONINI in the last 168 hours. BNP (last 3 results) No results for input(s): PROBNP in the last 8760 hours. HbA1C: No results for input(s): HGBA1C in the last 72 hours. CBG: Recent Labs  Lab 08/02/2020 2250 08/25/20 0333  GLUCAP 79 96   Lipid Profile: No results for input(s): CHOL, HDL, LDLCALC, TRIG, CHOLHDL, LDLDIRECT in the last 72 hours. Thyroid Function Tests: No results for input(s): TSH, T4TOTAL, FREET4, T3FREE, THYROIDAB in the last 72 hours. Anemia Panel: No results for input(s): VITAMINB12, FOLATE, FERRITIN, TIBC, IRON, RETICCTPCT in the last 72 hours. Sepsis Labs: Recent Labs  Lab 08/23/2020 1500 08/17/2020 1715  PROCALCITON  --  0.15  LATICACIDVEN 2.9* 2.8*    Recent Results (from the past 240 hour(s))  Blood Culture (routine x 2)     Status: None   Collection Time: 08/09/2020  3:11 PM   Specimen: BLOOD  Result Value Ref Range Status   Specimen Description BLOOD BLOOD LEFT FOREARM  Final   Special Requests   Final    BOTTLES DRAWN AEROBIC AND ANAEROBIC Blood Culture adequate volume   Culture   Final     NO GROWTH 5 DAYS Performed at [facility], [address] [place], [place] [address]    Report Status 08/26/2020 FINAL  Final  SARS Coronavirus 2 by RT PCR (hospital order, performed in [facility] hospital lab) Nasopharyngeal Nasopharyngeal Swab     Status: None   Collection Time: 08/16/2020  3:11 PM   Specimen: Nasopharyngeal Swab  Result Value Ref Range Status   SARS Coronavirus 2 NEGATIVE NEGATIVE Final    Comment: (NOTE) SARS-CoV-2 target nucleic acids are NOT DETECTED.  The SARS-CoV-2 RNA is generally detectable in upper and lower respiratory specimens during the acute phase of infection. The lowest concentration of SARS-CoV-2 viral copies this assay can detect is 250 copies / mL. A negative result does not preclude SARS-CoV-2 infection and should not be used as the sole basis for treatment or other patient management decisions.  A negative result may occur with improper specimen collection / handling, submission of specimen other than nasopharyngeal swab, presence of viral mutation(s) within the areas targeted by this assay, and inadequate number of viral copies (<250 copies / mL). A negative result must be combined with clinical observations, patient history, and epidemiological information.  Fact Sheet for Patients:   StrictlyIdeas.no  Fact Sheet for Healthcare Providers: BankingDealers.co.za  This test is not yet approved or  cleared by the [place] FDA and has been authorized for detection and/or diagnosis of SARS-CoV-2 by FDA under an Emergency Use Authorization (EUA).  This EUA will remain in effect (meaning this test can be used) for the duration of the COVID-19 declaration under Section 564(b)(1) of the Act, 21 U.S.C. section 360bbb-3(b)(1), unless the authorization is terminated or revoked sooner.  Performed at [facility], [place] [address], [place], [place] [address]   Urine culture     Status:  Abnormal   Collection Time: 08/29/2020  6:09 PM   Specimen: In/Out Cath Urine  Result Value Ref Range Status   Specimen Description IN/OUT CATH URINE  Final   Special Requests   Final    NONE Performed at [facility], [place] [address], [place], [place] [address]    Culture (A)  Final    >=100,000 COLONIES/mL KLEBSIELLA PNEUMONIAE >=100,000 COLONIES/mL ENTEROCOCCUS FAECALIS  Report Status 08/24/2020 FINAL  Final   Organism ID, Bacteria KLEBSIELLA PNEUMONIAE (A)  Final   Organism ID, Bacteria ENTEROCOCCUS FAECALIS (A)  Final      Susceptibility   Enterococcus faecalis - MIC*    AMPICILLIN <=2 SENSITIVE Sensitive     NITROFURANTOIN <=16 SENSITIVE Sensitive     VANCOMYCIN 1 SENSITIVE Sensitive     * >=100,000 COLONIES/mL ENTEROCOCCUS FAECALIS   Klebsiella pneumoniae - MIC*    AMPICILLIN >=32 RESISTANT Resistant     CEFAZOLIN <=4 SENSITIVE Sensitive     CEFEPIME <=0.12 SENSITIVE Sensitive     CEFTRIAXONE <=0.25 SENSITIVE Sensitive     CIPROFLOXACIN <=0.25 SENSITIVE Sensitive     GENTAMICIN <=1 SENSITIVE Sensitive     IMIPENEM <=0.25 SENSITIVE Sensitive     NITROFURANTOIN 64 INTERMEDIATE Intermediate     TRIMETH/SULFA <=20 SENSITIVE Sensitive     AMPICILLIN/SULBACTAM 4 SENSITIVE Sensitive     PIP/TAZO <=4 SENSITIVE Sensitive     * >=100,000 COLONIES/mL KLEBSIELLA PNEUMONIAE  Sputum Culture     Status: None   Collection Time: 08/22/20  2:01 AM   Specimen: Expectorated Sputum  Result Value Ref Range Status   Specimen Description EXPECTORATED SPUTUM  Final   Special Requests NONE  Final   Sputum evaluation   Final    Sputum specimen not acceptable for testing.  Please recollect.   RESULT CALLED TO, READ BACK BY AND VERIFIED WITH: Elige Radon RN 08/22/20 [id number] Performed at [facility], [address], [place], [place] [address]    Report Status 08/22/2020 FINAL  Final  Blood Culture (routine x 2)     Status: None (Preliminary result)   Collection Time: 08/22/20  10:03 AM   Specimen: BLOOD  Result Value Ref Range Status   Specimen Description BLOOD LEFT ANTECUBITAL  Final   Special Requests   Final    BOTTLES DRAWN AEROBIC AND ANAEROBIC Blood Culture results may not be optimal due to an excessive volume of blood received in culture bottles   Culture   Final    NO GROWTH 4 DAYS Performed at [facility], [place] [address], [place], [place] [address]    Report Status PENDING  Incomplete  MRSA PCR Screening     Status: None   Collection Time: 08/22/20  2:33 PM   Specimen: Nasal Mucosa; Nasopharyngeal  Result Value Ref Range Status   MRSA by PCR NEGATIVE NEGATIVE Final    Comment:        The GeneXpert MRSA Assay (FDA approved for NASAL specimens only), is one component of a comprehensive MRSA colonization surveillance program. It is not intended to diagnose MRSA infection nor to guide or monitor treatment for MRSA infections. Performed at [facility], [place] [address] [place], [place] [address]          Radiology Studies: US RENAL  Result Date: 08/24/2020 CLINICAL DATA:  Acute renal failure. EXAM: RENAL / URINARY TRACT ULTRASOUND COMPLETE COMPARISON:  CT of the abdomen and pelvis 12/09/2019 FINDINGS: Right Kidney: Renal measurements: 9.3 x 4.7 x 4.6 cm = volume: 105.2 ML. The renal parenchyma is isoechoic to the index organ, the liver. No stone or mass lesion is present. No obstruction is present. Left Kidney: Renal measurements: 4.5 x 2.2 x 2.0 cm = volume: 10.2 mL. Kidneys markedly atrophic. Hypoechoic lesion near the upper pole likely represents a cyst measuring 1.1 x 1.1 x 1.0 cm Bladder: Bladder wall is diffusely thickened. No definite mass lesion  is present. Layering debris is evident. Other: None. IMPRESSION: 1. Marked atrophy left kidney. 2. Increased echogenicity of normal sized right kidney. This is nonspecific, but can be seen in the setting of medical renal disease. 3. Diffuse bladder wall thickening and irregularity.  Question cystitis. Focal inflammation or neoplasm not excluded. Consider cystoscopy. Electronically Signed   By: San Morelle M.D.   On: 08/24/2020 12:18        Scheduled Meds: . acetaminophen  500 mg Oral Q6H  . acetaminophen  650 mg Oral Once   Continuous Infusions:    LOS: 5 days    Time spent: 28 minutes spent on chart review, discussion with nursing staff, consultants, updating family and interview/physical exam; more than 50% of that time was spent in counseling and/or coordination of care.    Coun[place] DO [facility] Available via Epic secure chat 7am-7pm After these hours, please refer to coverage provider listed on [email or web address] 08/26/2020, 11:04 AM

## 2020-08-26 NOTE — Progress Notes (Signed)
[facility] ([facility]) Hospital Liaison note.    Received request from TOC manager for family interest in [facility]. Chart and pt information has been reviewed by [facility] physician.  Hospice eligibility confirmed.  [facility] is unable to offer a room today. Hospital Liaison will follow up tomorrow or sooner if a room becomes available. Please do not hesitate to call with questions.    Thank you for the opportunity to participate in this patient's care.  Chrislyn King, BSN, RN [facility] Hospital Liaison (listed on AMION under Hospice/[facility])    [phone]  (24h on call)   

## 2020-08-26 NOTE — Progress Notes (Signed)
14 fr indwelling foley cath placed for end of life care

## 2020-08-27 DIAGNOSIS — A419 Sepsis, unspecified organism: Secondary | ICD-10-CM | POA: Diagnosis not present

## 2020-08-27 DIAGNOSIS — E43 Unspecified severe protein-calorie malnutrition: Secondary | ICD-10-CM | POA: Diagnosis not present

## 2020-08-27 DIAGNOSIS — I10 Essential (primary) hypertension: Secondary | ICD-10-CM | POA: Diagnosis not present

## 2020-08-27 DIAGNOSIS — Z515 Encounter for palliative care: Secondary | ICD-10-CM

## 2020-08-27 DIAGNOSIS — Z66 Do not resuscitate: Secondary | ICD-10-CM | POA: Diagnosis present

## 2020-08-27 DIAGNOSIS — E785 Hyperlipidemia, unspecified: Secondary | ICD-10-CM | POA: Diagnosis not present

## 2020-08-27 LAB — CULTURE, BLOOD (ROUTINE X 2): Culture: NO GROWTH

## 2020-08-27 NOTE — Progress Notes (Signed)
PROGRESS NOTE    Kristina [name]  [id number] DOB: 1926/03/01 DOA: 08/11/2020 PCP: Holland Commons, FNP    Brief Narrative:  Kristina [name] is a 87 year old female with past medical history significant for CKD stage IIIb, solitary kidney, hyperlipidemia, essential hypertension, peripheral artery disease, iron deficiency anemia, peptic ulcer disease, AAA, chronic hypoxic respiratory failure on 5L [place] baseline who presented to the ED via EMS from [facility] with complaints of shortness of breath and chest pain.  Upon EMS arrival, patient was found to be hypoxic with SPO2 in the 70s due to apparent oxygen machine malfunction.  Patient complained of sharp chest pain, 9/10 and was administered 1 nitroglycerin tablet and aspirin 324 mg orally.  Patient stated that onset of symptoms, roughly 2 days ago with mild nonproductive cough.  She denies any fevers, chills, night sweats, no nausea/vomiting/diarrhea, no abdominal pain.  Denies any recent sick contacts.  Patient has been vaccinated against COVID-19.    In the ED temperature 94.4 F, HR 76, RR 25, BP 159/90, SPO2 100% on 4 L nasal cannula.  Sodium 139, potassium 5.0, chloride 97, CO2 27, glucose 140, BUN 46, creatinine 1.77, BNP 752.6, troponin 372>477.  Lactic acid 2.9.  BBC 13.1, hemoglobin 7.1, MCV 100.4, platelets 563.  INR 1.0. SARS-CoV-2/COVID-19 PCR negative.  Chest x-ray with bilateral interstitial thickening and patchy alveolar opacities throughout right lung and mid interstitial thickening of the left lung with cardiomegaly concerning for multilobar pneumonia versus asymmetric pulmonary edema.  CT chest without contrast with moderate right and small left pleural effusions with associated atelectasis.  Patient was given 1 L LR bolus, started on vancomycin and cefepime.  Hospitalist service consulted for further evaluation and management.  During night of admission, patient started developing hypotension despite aggressive  resuscitation with IV fluids at 30 mL/kg.  PCCM was consulted for possible transfer to ICU for vasopressor initiation.  Patient was also noted to have a drop in her hemoglobin from 7.1-6.3 and was transfused 2 unit PRBC.  Patient was evaluated by Dr. Lake Bells, who noted that her blood pressure improved with blood transfusion and further discussion with family that patient would not want aggressive measures such as CPR or intubation and that patient was stable to remain on the hospitalist service.   Assessment & Plan:   Active Problems:   Atherosclerotic PVD with intermittent claudication - near occlusive SFA disease with moderate iliac disease   Essential hypertension   Hyperlipidemia with target low density lipoprotein (LDL) cholesterol less than 70 mg/dL   Chronic kidney disease (CKD), stage III (moderate) (HCC)   Protein-calorie malnutrition (HCC)   Lobar pneumonia (HCC)   Sepsis (HCC)   CAP (community acquired pneumonia)   Septic shock (HCC)   Severe sepsis (HCC)   Pressure injury of skin   Severe sepsis, present on admission Community-acquired pneumonia GNR Urinary tract infection Patient presenting from nursing facility with shortness of breath, cough.  Patient hypothermic with temperature 94.4 F and elevated WBC count of 13.1 on admission.  Urinalysis with moderate leukoesterase, negative nitrite, many bacteria and greater than 50 WBCs.  Lactic acid elevated 2.9 with procalcitonin 0.15.  Chest x-ray with findings consistent with multilobar pneumonia.  COVID-19 PCR negative.  Sputum obtain unacceptable for testing.   Urine culture positive for Klebsiella pneumonia and Enterococcus faecalis. Patient was started on empiric antibiotics with azithromycin and ceftriaxone which was the escalated to Unasyn.   Patient's white blood cell count improved to 14.8.  Given patient's  continued decline, discussion with patient's daughter, [name] regarding poor prognosis; and was decided to transition care  to comfort measures at this time. --Continue supportive care with morphine PO/SL prn, ativan prn, glycopyrrolate as needed --Pending bed availability at [facility] residential hospice  Acute on chronic hypoxic respiratory failure Patient was found by EMS with SPO2 in the 70s, at baseline on 5 L nasal cannula.  Apparently oxygen machine at SNF malfunctioning.  Now transitioned to comfort measures as above  Symptomatic anemia Hx iron deficiency anemia Hx of peptic ulcer disease Patient presenting with hemoglobin 7.1.  Baseline 8.0-9.1. Hx of peptic ulcer disease, follows with GI, Dr. Benson Norway with EGD 07/07/2020 with few cratered nonbleeding duodenal ulcers.  Anemia panel with iron 50, TIBC 318, ferritin 91, B12 591, folate 16.1.  Transfused 2 units PRBCs on 08/23/2019.  Hemoglobin remained stable following transfusion.  Now on comfort measures.  Acute renal failure on CKD stage IIIb Baseline creatinine 1.2-1.4. Creatinine 1.77 on admission.  Etiology likely secondary to prerenal azotemia from dehydration versus ATN from sepsis and hypotension.  Renal ultrasound with marked atrophy left kidney, increased echogenicity of normal size right kidney consistent with medical renal disease and diffuse bladder wall thickening and irregularity.  Patient's creatinine continued to worsen with increased BUN despite aggressive measures.  Patient now somnolent and transition to comfort measures.  Hyperkalemia Treated with IV fluids and Lokelma without improvement due to progressing renal failure.  Now transitioned patient to comfort measures.  Elevated troponin likely secondary to type II demand ischemia Patient presenting with chest pain in the setting of hypoxia as her oxygen was malfunctioning at her SNF as well as anemia.  Troponin elevated at 372>477 on ED arrival.  With NSR, rate 80, QTc 439, no concerning ST elevation/depression or T wave inversions.  TTE with LVEF 65-70%, LV no regional wall motion  normalities, RV moderately enlarged with severely elevated pulmonary artery pressure, LA severely dilated, RA severely dilated, IVC dilated.  Elevated LFTs Etiology likely secondary to mild shock liver from hypotension/severe sepsis as above.  Essential hypertension Chronic diastolic congestive heart failure TTE w/ LVEF 65-70%  Left anterior shin wound, POA  Severe protein calorie malnutrition Body mass index is 19.35 kg/m.  Patient very thin, cachectic emaciated in appearance   DVT prophylaxis: SCDs, on comfort measures   Code Status: DNR Family Communication: No family present at bedside this morning, updated patient's daughter via telephone who agrees with transition to comfort measures today  Disposition Plan:  Level of care: Telemetry Medical, discontinued telemetry Status is: Inpatient  Remains inpatient appropriate because:Hemodynamically unstable, Altered mental status, Ongoing diagnostic testing needed not appropriate for outpatient work up, Unsafe d/c plan, IV treatments appropriate due to intensity of illness or inability to take PO and Inpatient level of care appropriate due to severity of illness   Dispo: The patient is from: SNF              Anticipated d/c is to: Residential hospice [facility] Bed pending              Anticipated d/c date is: 1 day              Patient currently is medically stable to d/c.   Difficult to place patient No   Consultants:   PCCM - signed off 1/22  Procedures:   TTE  Antimicrobials:   Vancomycin 1/21 - 1/23  Azithromycin 1/21 - 1/24  Ceftriaxone 1/21 -1/24  Cefepime 1/21 - 1/21  Unasyn  1/24 - 1/25    Subjective: Patient seen and examined at bedside, sleeping and unarousable. Awaiting placement at [facility] residential hosp[facility], no bed available today.  No acute concerns overnight per nursing staff.  Objective: Vitals:   08/25/20 0559 08/25/20 2000 08/26/20 1700 08/26/20 1900  BP: 107/76 118/66  (!) 150/88   Pulse: 100 (!) 104 (!) 126 (!) 118  Resp: 12   (!) 21  Temp: 98.4 F (36.9 C) 99.3 F (37.4 C)  98.8 F (37.1 C)  TempSrc: Oral Axillary  Axillary  SpO2: 91% 96% 91% 91%  Weight:      Height:        Intake/Output Summary (Last 24 hours) at 08/27/2020 1031 Last data filed at 08/26/2020 2778 Gross per 24 hour  Intake 0 ml  Output 400 ml  Net -400 ml   Filed Weights   08/29/2020 1429  Weight: 42 kg    Examination:  General exam: No distress, obtunded, elderly/frail in appearance Respiratory system: Breath sounds slightly decreased right base with mild crackles, otherwise clear without wheezing, normal respiratory effort without accessory muscle use, on 4L nasal cannula Cardiovascular system: S1 & S2 heard, RRR. No JVD, murmurs, rubs, gallops or clicks.  Bilateral ankle edema, nonpitting Gastrointestinal system: Abdomen is nondistended, soft and nontender. No organomegaly or masses felt. Normal bowel sounds heard. Central nervous system: Somnolent/obtunded Extremities: Will move extremities independently but not to command Skin: Left shin wound  Psychiatry: Unable to assess due to mental status    Data Reviewed: I have personally reviewed following labs and imaging studies  CBC: Recent Labs  Lab 08/20/2020 1500 08/22/20 0209 08/22/20 1003 08/22/20 1600 08/22/20 2100 08/23/20 0852 08/24/20 0428 08/25/20 0508  WBC 13.1* 24.3*  --   --   --  17.6* 15.2* 14.8*  NEUTROABS  --  22.5*  --   --   --   --   --   --   HGB 7.1* 6.3*   < > 10.5* 10.6* 10.6* 11.1* 10.4*  HCT 24.2* 21.3*   < > 33.7* 33.3* 32.8* 35.4* 35.7*  MCV 100.4* 100.0  --   --   --  91.9 94.7 98.3  PLT 563* 450*  --   --   --  339 343 307   < > = values in this interval not displayed.   Basic Metabolic Panel: Recent Labs  Lab 08/14/2020 1500 08/22/20 1003 08/23/20 0214 08/24/20 0428 08/25/20 0508  NA 139 136 135 139 140  K 5.0 6.0* 5.9* 5.9* 6.8*  CL 97* 99 99 100 101  CO2 27 24 23 25  20*  GLUCOSE  140* 107* 70 88 97  BUN 46* 47* 56* 70* 85*  CREATININE 1.77* 2.18* 2.56* 2.88* 3.33*  CALCIUM 9.3 8.7* 8.5* 9.0 8.7*  MG  --   --  1.8 1.9 2.1   GFR: Estimated Creatinine Clearance: 7 mL/min (A) (by C-G formula based on SCr of 3.33 mg/dL (H)). Liver Function Tests: Recent Labs  Lab 08/02/2020 1715 08/23/20 0214 08/24/20 0428 08/25/20 0508  AST 135* 156* 148* 277*  ALT 119* 182* 202* 278*  ALKPHOS 118 164* 177* 156*  BILITOT 0.5 0.6 0.5 0.7  PROT 6.3* 5.5* 6.0* 5.6*  ALBUMIN 3.0* 2.5* 2.5* 2.4*   No results for input(s): LIPASE, AMYLASE in the last 168 hours. No results for input(s): AMMONIA in the last 168 hours. Coagulation Profile: Recent Labs  Lab 08/04/2020 1500 08/22/20 1000  INR 1.0 1.3*   Cardiac Enzymes:  No results for input(s): CKTOTAL, CKMB, CKMBINDEX, TROPONINI in the last 168 hours. BNP (last 3 results) No results for input(s): PROBNP in the last 8760 hours. HbA1C: No results for input(s): HGBA1C in the last 72 hours. CBG: Recent Labs  Lab 08/05/2020 2250 08/25/20 0333  GLUCAP 79 96   Lipid Profile: No results for input(s): CHOL, HDL, LDLCALC, TRIG, CHOLHDL, LDLDIRECT in the last 72 hours. Thyroid Function Tests: No results for input(s): TSH, T4TOTAL, FREET4, T3FREE, THYROIDAB in the last 72 hours. Anemia Panel: No results for input(s): VITAMINB12, FOLATE, FERRITIN, TIBC, IRON, RETICCTPCT in the last 72 hours. Sepsis Labs: Recent Labs  Lab 08/20/2020 1500 08/28/2020 1715  PROCALCITON  --  0.15  LATICACIDVEN 2.9* 2.8*    Recent Results (from the past 240 hour(s))  Blood Culture (routine x 2)     Status: None   Collection Time: 08/24/2020  3:11 PM   Specimen: BLOOD  Result Value Ref Range Status   Specimen Description BLOOD BLOOD LEFT FOREARM  Final   Special Requests   Final    BOTTLES DRAWN AEROBIC AND ANAEROBIC Blood Culture adequate volume   Culture   Final    NO GROWTH 5 DAYS Performed at [facility], [address], [place], [place]  [address]    Report Status 08/26/2020 FINAL  Final  SARS Coronavirus 2 by RT PCR (hospital order, performed in [facility] hospital lab) Nasopharyngeal Nasopharyngeal Swab     Status: None   Collection Time: 08/20/2020  3:11 PM   Specimen: Nasopharyngeal Swab  Result Value Ref Range Status   SARS Coronavirus 2 NEGATIVE NEGATIVE Final    Comment: (NOTE) SARS-CoV-2 target nucleic acids are NOT DETECTED.  The SARS-CoV-2 RNA is generally detectable in upper and lower respiratory specimens during the acute phase of infection. The lowest concentration of SARS-CoV-2 viral copies this assay can detect is 250 copies / mL. A negative result does not preclude SARS-CoV-2 infection and should not be used as the sole basis for treatment or other patient management decisions.  A negative result may occur with improper specimen collection / handling, submission of specimen other than nasopharyngeal swab, presence of viral mutation(s) within the areas targeted by this assay, and inadequate number of viral copies (<250 copies / mL). A negative result must be combined with clinical observations, patient history, and epidemiological information.  Fact Sheet for Patients:   StrictlyIdeas.no  Fact Sheet for Healthcare Providers: BankingDealers.co.za  This test is not yet approved or  cleared by the [place] FDA and has been authorized for detection and/or diagnosis of SARS-CoV-2 by FDA under an Emergency Use Authorization (EUA).  This EUA will remain in effect (meaning this test can be used) for the duration of the COVID-19 declaration under Section 564(b)(1) of the Act, 21 U.S.C. section 360bbb-3(b)(1), unless the authorization is terminated or revoked sooner.  Performed at [facility], [place] [address] [place], [place] [address]   Urine culture     Status: Abnormal   Collection Time: 08/20/2020  6:09 PM   Specimen: In/Out Cath Urine  Result  Value Ref Range Status   Specimen Description IN/OUT CATH URINE  Final   Special Requests   Final    NONE Performed at [facility], [place] [address] [place], [place] [address]    Culture (A)  Final    >=100,000 COLONIES/mL KLEBSIELLA PNEUMONIAE >=100,000 COLONIES/mL ENTEROCOCCUS FAECALIS    Report Status 08/24/2020 FINAL  Final   Organism ID,  Bacteria KLEBSIELLA PNEUMONIAE (A)  Final   Organism ID, Bacteria ENTEROCOCCUS FAECALIS (A)  Final      Susceptibility   Enterococcus faecalis - MIC*    AMPICILLIN <=2 SENSITIVE Sensitive     NITROFURANTOIN <=16 SENSITIVE Sensitive     VANCOMYCIN 1 SENSITIVE Sensitive     * >=100,000 COLONIES/mL ENTEROCOCCUS FAECALIS   Klebsiella pneumoniae - MIC*    AMPICILLIN >=32 RESISTANT Resistant     CEFAZOLIN <=4 SENSITIVE Sensitive     CEFEPIME <=0.12 SENSITIVE Sensitive     CEFTRIAXONE <=0.25 SENSITIVE Sensitive     CIPROFLOXACIN <=0.25 SENSITIVE Sensitive     GENTAMICIN <=1 SENSITIVE Sensitive     IMIPENEM <=0.25 SENSITIVE Sensitive     NITROFURANTOIN 64 INTERMEDIATE Intermediate     TRIMETH/SULFA <=20 SENSITIVE Sensitive     AMPICILLIN/SULBACTAM 4 SENSITIVE Sensitive     PIP/TAZO <=4 SENSITIVE Sensitive     * >=100,000 COLONIES/mL KLEBSIELLA PNEUMONIAE  Sputum Culture     Status: None   Collection Time: 08/22/20  2:01 AM   Specimen: Expectorated Sputum  Result Value Ref Range Status   Specimen Description EXPECTORATED SPUTUM  Final   Special Requests NONE  Final   Sputum evaluation   Final    Sputum specimen not acceptable for testing.  Please recollect.   RESULT CALLED TO, READ BACK BY AND VERIFIED WITH: Elige Radon RN 08/22/20 0086 JDW Performed at [facility], [address], [place], [place] [address]    Report Status 08/22/2020 FINAL  Final  Blood Culture (routine x 2)     Status: None (Preliminary result)   Collection Time: 08/22/20 10:03 AM   Specimen: BLOOD  Result Value Ref Range Status   Specimen Description BLOOD  LEFT ANTECUBITAL  Final   Special Requests   Final    BOTTLES DRAWN AEROBIC AND ANAEROBIC Blood Culture results may not be optimal due to an excessive volume of blood received in culture bottles   Culture   Final    NO GROWTH 4 DAYS Performed at [facility], [place] [address] [place], [place] [address]    Report Status PENDING  Incomplete  MRSA PCR Screening     Status: None   Collection Time: 08/22/20  2:33 PM   Specimen: Nasal Mucosa; Nasopharyngeal  Result Value Ref Range Status   MRSA by PCR NEGATIVE NEGATIVE Final    Comment:        The GeneXpert MRSA Assay (FDA approved for NASAL specimens only), is one component of a comprehensive MRSA colonization surveillance program. It is not intended to diagnose MRSA infection nor to guide or monitor treatment for MRSA infections. Performed at [facility], [place] [address], [place], [place] [address]   SARS CORONAVIRUS 2 (TAT 6-24 HRS) Nasopharyngeal Nasopharyngeal Swab     Status: None   Collection Time: 08/26/20  2:04 PM   Specimen: Nasopharyngeal Swab  Result Value Ref Range Status   SARS Coronavirus 2 NEGATIVE NEGATIVE Final    Comment: (NOTE) SARS-CoV-2 target nucleic acids are NOT DETECTED.  The SARS-CoV-2 RNA is generally detectable in upper and lower respiratory specimens during the acute phase of infection. Negative results do not preclude SARS-CoV-2 infection, do not rule out co-infections with other pathogens, and should not be used as the sole basis for treatment or other patient management decisions. Negative results must be combined with clinical observations, patient history, and epidemiological information. The expected result is Negative.  Fact Sheet for Patients: SugarRoll.be  Fact Sheet for Healthcare Providers: https://www.woods-mathews.com/  This test is not yet approved or cleared by the [place] FDA and  has been authorized for detection and/or  diagnosis of SARS-CoV-2 by FDA under an Emergency Use Authorization (EUA). This EUA will remain  in effect (meaning this test can be used) for the duration of the COVID-19 declaration under Se ction 564(b)(1) of the Act, 21 U.S.C. section 360bbb-3(b)(1), unless the authorization is terminated or revoked sooner.  Performed at [facility], [place] [address], [place], [place] [address]          Radiology Studies: No results found.      Scheduled Meds: . acetaminophen  500 mg Oral Q6H  . acetaminophen  650 mg Oral Once   Continuous Infusions:    LOS: 6 days    Time spent: 28 minutes spent on chart review, discussion with nursing staff, consultants, updating family and interview/physical exam; more than 50% of that time was spent in counseling and/or coordination of care.    Meli Faley J [place] DO [facility] Available via Epic secure chat 7am-7pm After these hours, please refer to coverage provider listed on [email or web address] 08/27/2020, 10:31 AM

## 2020-08-27 NOTE — Progress Notes (Signed)
[facility] ([facility]) Hospital Liaison note.    [facility] is unable to offer a room today. Hospital Liaison will follow up tomorrow or sooner if a room becomes available. Please do not hesitate to call with questions.    Thank you for the opportunity to participate in this patient's care.  Chrislyn King, BSN, RN [facility] Hospital Liaison (listed on AMION under Hospice/[facility])    [phone] (24h on call)     

## 2020-08-27 NOTE — Plan of Care (Addendum)
Patient given PRN IV Ativan due to increased HR and agitation. Pat tolerated it well.   Problem: Education: Goal: Knowledge of General Education information will improve Description: Including pain rating scale, medication(s)/side effects and non-pharmacologic comfort measures 08/27/2020 2333 by Trixie Deis, RN Outcome: Not Progressing   Problem: Activity: Goal: Risk for activity intolerance will decrease 08/27/2020 2333 by Trixie Deis, RN Outcome: Not Progressing   Problem: Safety: Goal: Ability to remain free from injury will improve 08/27/2020 2333 by Trixie Deis, RN Outcome: Not Progressing   Problem: Skin Integrity: Goal: Risk for impaired skin integrity will decrease 08/27/2020 2333 by Trixie Deis, RN Outcome: Not Progressing

## 2020-08-27 NOTE — Progress Notes (Signed)
Nutrition Brief Note  Chart reviewed. Pt now transitioning to comfort care.  No further nutrition interventions warranted at this time.  Please re-consult as needed.   Estrella Alcaraz W, RD, LDN, CDCES Registered Dietitian II Certified Diabetes Care and Education Specialist Please refer to AMION for RD and/or RD on-call/weekend/after hours pager  

## 2020-08-27 NOTE — Plan of Care (Signed)
PRN ativan IV given due to agitation, patient tolerated well    Problem: Education: Goal: Knowledge of General Education information will improve Description: Including pain rating scale, medication(s)/side effects and non-pharmacologic comfort measures Outcome: Not Progressing   Problem: Activity: Goal: Risk for activity intolerance will decrease Outcome: Not Progressing   Problem: Nutrition: Goal: Adequate nutrition will be maintained Outcome: Not Progressing   Problem: Safety: Goal: Ability to remain free from injury will improve Outcome: Not Progressing   Problem: Skin Integrity: Goal: Risk for impaired skin integrity will decrease Outcome: Not Progressing

## 2020-08-28 DIAGNOSIS — I70219 Atherosclerosis of native arteries of extremities with intermittent claudication, unspecified extremity: Secondary | ICD-10-CM | POA: Diagnosis not present

## 2020-08-28 DIAGNOSIS — A419 Sepsis, unspecified organism: Secondary | ICD-10-CM | POA: Diagnosis not present

## 2020-08-28 DIAGNOSIS — Z66 Do not resuscitate: Secondary | ICD-10-CM

## 2020-08-28 DIAGNOSIS — J189 Pneumonia, unspecified organism: Secondary | ICD-10-CM | POA: Diagnosis not present

## 2020-08-28 NOTE — Progress Notes (Signed)
PROGRESS NOTE    Kristina [name]  [id number] DOB: March 19, 1926 DOA: 08/09/2020 PCP: Holland Commons, FNP    Brief Narrative:  Kristina [name] is a 87 year old female with past medical history significant for CKD stage IIIb, solitary kidney, hyperlipidemia, essential hypertension, peripheral artery disease, iron deficiency anemia, peptic ulcer disease, AAA, chronic hypoxic respiratory failure on 5L [place] baseline who presented to the ED via EMS from [facility] with complaints of shortness of breath and chest pain.  Upon EMS arrival, patient was found to be hypoxic with SPO2 in the 70s due to apparent oxygen machine malfunction.  Patient complained of sharp chest pain, 9/10 and was administered 1 nitroglycerin tablet and aspirin 324 mg orally.  Patient stated that onset of symptoms, roughly 2 days ago with mild nonproductive cough.  She denies any fevers, chills, night sweats, no nausea/vomiting/diarrhea, no abdominal pain.  Denies any recent sick contacts.  Patient has been vaccinated against COVID-19.    In the ED temperature 94.4 F, HR 76, RR 25, BP 159/90, SPO2 100% on 4 L nasal cannula.  Sodium 139, potassium 5.0, chloride 97, CO2 27, glucose 140, BUN 46, creatinine 1.77, BNP 752.6, troponin 372>477.  Lactic acid 2.9.  BBC 13.1, hemoglobin 7.1, MCV 100.4, platelets 563.  INR 1.0. SARS-CoV-2/COVID-19 PCR negative.  Chest x-ray with bilateral interstitial thickening and patchy alveolar opacities throughout right lung and mid interstitial thickening of the left lung with cardiomegaly concerning for multilobar pneumonia versus asymmetric pulmonary edema.  CT chest without contrast with moderate right and small left pleural effusions with associated atelectasis.  Patient was given 1 L LR bolus, started on vancomycin and cefepime.  Hospitalist service consulted for further evaluation and management.  During night of admission, patient started developing hypotension despite aggressive  resuscitation with IV fluids at 30 mL/kg.  PCCM was consulted for possible transfer to ICU for vasopressor initiation.  Patient was also noted to have a drop in her hemoglobin from 7.1-6.3 and was transfused 2 unit PRBC.  Patient was evaluated by Dr. Lake Bells, who noted that her blood pressure improved with blood transfusion and further discussion with family that patient would not want aggressive measures such as CPR or intubation and that patient was stable to remain on the hospitalist service.   Assessment & Plan:   Active Problems:   Atherosclerotic PVD with intermittent claudication - near occlusive SFA disease with moderate iliac disease   Essential hypertension   Hyperlipidemia with target low density lipoprotein (LDL) cholesterol less than 70 mg/dL   Chronic kidney disease (CKD), stage III (moderate) (HCC)   Protein-calorie malnutrition (HCC)   Lobar pneumonia (HCC)   Sepsis (HCC)   CAP (community acquired pneumonia)   Septic shock ([place])   Severe sepsis ([place])   Pressure injury of skin   DNR (do not resuscitate)   Terminal care   Severe sepsis, present on admission Community-acquired pneumonia GNR Urinary tract infection Patient presenting from nursing facility with shortness of breath, cough.  Patient hypothermic with temperature 94.4 F and elevated WBC count of 13.1 on admission.  Urinalysis with moderate leukoesterase, negative nitrite, many bacteria and greater than 50 WBCs.  Lactic acid elevated 2.9 with procalcitonin 0.15.  Chest x-ray with findings consistent with multilobar pneumonia.  COVID-19 PCR negative.  Sputum obtain unacceptable for testing.   Urine culture positive for Klebsiella pneumonia and Enterococcus faecalis. Patient was started on empiric antibiotics with azithromycin and ceftriaxone which was the escalated to Unasyn.   Patient's  white blood cell count improved to 14.8.  Given patient's continued decline, discussion with patient's daughter, [name] regarding poor  prognosis; and was decided to transition care to comfort measures at this time. --Continue supportive care with morphine PO/SL prn, ativan prn, glycopyrrolate as needed --Pending bed availability at [facility] residential [facility]  Acute on chronic hypoxic respiratory failure Patient was found by EMS with SPO2 in the 70s, at baseline on 5 L nasal cannula.  Apparently oxygen machine at SNF malfunctioning.  Now transitioned to comfort measures as above  Symptomatic anemia Hx iron deficiency anemia Hx of peptic ulcer disease Patient presenting with hemoglobin 7.1.  Baseline 8.0-9.1. Hx of peptic ulcer disease, follows with GI, Dr. Benson Norway with EGD 07/07/2020 with few cratered nonbleeding duodenal ulcers.  Anemia panel with iron 50, TIBC 318, ferritin 91, B12 591, folate 16.1.  Transfused 2 units PRBCs on 08/23/2019.  Hemoglobin remained stable following transfusion.  Now on comfort measures.  Acute renal failure on CKD stage IIIb Baseline creatinine 1.2-1.4. Creatinine 1.77 on admission.  Etiology likely secondary to prerenal azotemia from dehydration versus ATN from sepsis and hypotension.  Renal ultrasound with marked atrophy left kidney, increased echogenicity of normal size right kidney consistent with medical renal disease and diffuse bladder wall thickening and irregularity.  Patient's creatinine continued to worsen with increased BUN despite aggressive measures.  Patient now somnolent and transition to comfort measures.  Hyperkalemia Treated with IV fluids and Lok[place]ma without improvement due to progressing renal failure.  Now transitioned patient to comfort measures.  Elevated troponin likely secondary to type II demand ischemia Patient presenting with chest pain in the setting of hypoxia as her oxygen was malfunctioning at her SNF as well as anemia.  Troponin elevated at 372>477 on ED arrival.  With NSR, rate 80, QTc 439, no concerning ST elevation/depression or T wave inversions.  TTE with LVEF  65-70%, LV no regional wall motion normalities, RV moderately enlarged with severely elevated pulmonary artery pressure, LA severely dilated, RA severely dilated, IVC dilated.  Elevated LFTs Etiology likely secondary to mild shock liver from hypotension/severe sepsis as above.  Essential hypertension Chronic diastolic congestive heart failure TTE w/ LVEF 65-70%  Left anterior shin wound, POA  Severe protein calorie malnutrition Body mass index is 19.35 kg/m.  Patient very thin, cachectic emaciated in appearance   DVT prophylaxis: SCDs, on comfort measures   Code Status: DNR Family Communication: No family present at bedside this morning, updated patient's daughter via telephone who agrees with transition to comfort measures today  Disposition Plan:  Level of care: Telemetry Medical, discontinued telemetry Status is: Inpatient  Remains inpatient appropriate because:Hemodynamically unstable, Altered mental status, Ongoing diagnostic testing needed not appropriate for outpatient work up, Unsafe d/c plan, IV treatments appropriate due to intensity of illness or inability to take PO and Inpatient level of care appropriate due to severity of illness   Dispo: The patient is from: SNF              Anticipated d/c is to: Residential hospice [facility] Bed pending              Anticipated d/c date is: 1 day              Patient currently is medically stable to d/c.   Difficult to place patient No   Consultants:   PCCM - signed off 1/22  Procedures:   TTE  Antimicrobials:   Vancomycin 1/21 - 1/23  Azithromycin 1/21 - 1/24  Ceftriaxone 1/21 -1/24  Cefepime 1/21 - 1/21  Unasyn 1/24 - 1/25    Subjective: Patient seen and examined at bedside, sleeping and unarousable. Awaiting placement at [facility] residential hospice.  No acute concerns overnight per nursing staff.  Objective: Vitals:   08/26/20 1900 08/27/20 2035 08/28/20 0440 08/28/20 1218  BP: (!) 150/88 (!)  155/119 131/86 (!) 129/95  Pulse: (!) 118 (!) 152 (!) 57 (!) 154  Resp: (!) 21 (!) 30 18 (!) 24  Temp: 98.8 F (37.1 C) 99.7 F (37.6 C) 98.9 F (37.2 C) 99.2 F (37.3 C)  TempSrc: Axillary Axillary Oral Oral  SpO2: 91% 92% 93% (!) 87%  Weight:      Height:        Intake/Output Summary (Last 24 hours) at 08/28/2020 1241 Last data filed at 08/28/2020 1011 Gross per 24 hour  Intake 0 ml  Output 1250 ml  Net -1250 ml   Filed Weights   08/29/2020 1429  Weight: 42 kg    Examination:  General exam: No distress, obtunded, elderly/frail in appearance Respiratory system: Breath sounds slightly decreased right base with mild crackles, otherwise clear without wheezing, normal respiratory effort without accessory muscle use, on 4L nasal cannula Cardiovascular system: S1 & S2 heard, RRR. No JVD, murmurs, rubs, gallops or clicks.  Bilateral ankle edema, nonpitting Gastrointestinal system: Abdomen is nondistended, soft and nontender. No organomegaly or masses felt. Normal bowel sounds heard. Central nervous system: Somnolent/obtunded Extremities: Will move extremities independently but not to command Skin: Left shin wound  Psychiatry: Unable to assess due to mental status    Data Reviewed: I have personally reviewed following labs and imaging studies  CBC: Recent Labs  Lab 08/23/2020 1500 08/22/20 0209 08/22/20 1003 08/22/20 1600 08/22/20 2100 08/23/20 0852 08/24/20 0428 08/25/20 0508  WBC 13.1* 24.3*  --   --   --  17.6* 15.2* 14.8*  NEUTROABS  --  22.5*  --   --   --   --   --   --   HGB 7.1* 6.3*   < > 10.5* 10.6* 10.6* 11.1* 10.4*  HCT 24.2* 21.3*   < > 33.7* 33.3* 32.8* 35.4* 35.7*  MCV 100.4* 100.0  --   --   --  91.9 94.7 98.3  PLT 563* 450*  --   --   --  339 343 307   < > = values in this interval not displayed.   Basic Metabolic Panel: Recent Labs  Lab 08/13/2020 1500 08/22/20 1003 08/23/20 0214 08/24/20 0428 08/25/20 0508  NA 139 136 135 139 140  K 5.0 6.0*  5.9* 5.9* 6.8*  CL 97* 99 99 100 101  CO2 27 24 23 25  20*  GLUCOSE 140* 107* 70 88 97  BUN 46* 47* 56* 70* 85*  CREATININE 1.77* 2.18* 2.56* 2.88* 3.33*  CALCIUM 9.3 8.7* 8.5* 9.0 8.7*  MG  --   --  1.8 1.9 2.1   GFR: Estimated Creatinine Clearance: 7 mL/min (A) (by C-G formula based on SCr of 3.33 mg/dL (H)). Liver Function Tests: Recent Labs  Lab 08/13/2020 1715 08/23/20 0214 08/24/20 0428 08/25/20 0508  AST 135* 156* 148* 277*  ALT 119* 182* 202* 278*  ALKPHOS 118 164* 177* 156*  BILITOT 0.5 0.6 0.5 0.7  PROT 6.3* 5.5* 6.0* 5.6*  ALBUMIN 3.0* 2.5* 2.5* 2.4*   No results for input(s): LIPASE, AMYLASE in the last 168 hours. No results for input(s): AMMONIA in the last 168 hours. Coagulation Profile: Recent  Labs  Lab 08/17/2020 1500 08/22/20 1000  INR 1.0 1.3*   Cardiac Enzymes: No results for input(s): CKTOTAL, CKMB, CKMBINDEX, TROPONINI in the last 168 hours. BNP (last 3 results) No results for input(s): PROBNP in the last 8760 hours. HbA1C: No results for input(s): HGBA1C in the last 72 hours. CBG: Recent Labs  Lab 08/16/2020 2250 08/25/20 0333  GLUCAP 79 96   Lipid Profile: No results for input(s): CHOL, HDL, LDLCALC, TRIG, CHOLHDL, LDLDIRECT in the last 72 hours. Thyroid Function Tests: No results for input(s): TSH, T4TOTAL, FREET4, T3FREE, THYROIDAB in the last 72 hours. Anemia Panel: No results for input(s): VITAMINB12, FOLATE, FERRITIN, TIBC, IRON, RETICCTPCT in the last 72 hours. Sepsis Labs: Recent Labs  Lab 08/28/2020 1500 08/31/2020 1715  PROCALCITON  --  0.15  LATICACIDVEN 2.9* 2.8*    Recent Results (from the past 240 hour(s))  Blood Culture (routine x 2)     Status: None   Collection Time: 08/22/2020  3:11 PM   Specimen: BLOOD  Result Value Ref Range Status   Specimen Description BLOOD BLOOD LEFT FOREARM  Final   Special Requests   Final    BOTTLES DRAWN AEROBIC AND ANAEROBIC Blood Culture adequate volume   Culture   Final    NO GROWTH 5  DAYS Performed at [facility], [address] [place], [place] [address]    Report Status 08/26/2020 FINAL  Final  SARS Coronavirus 2 by RT PCR (hospital order, performed in [facility] hospital lab) Nasopharyngeal Nasopharyngeal Swab     Status: None   Collection Time: 08/27/2020  3:11 PM   Specimen: Nasopharyngeal Swab  Result Value Ref Range Status   SARS Coronavirus 2 NEGATIVE NEGATIVE Final    Comment: (NOTE) SARS-CoV-2 target nucleic acids are NOT DETECTED.  The SARS-CoV-2 RNA is generally detectable in upper and lower respiratory specimens during the acute phase of infection. The lowest concentration of SARS-CoV-2 viral copies this assay can detect is 250 copies / mL. A negative result does not preclude SARS-CoV-2 infection and should not be used as the sole basis for treatment or other patient management decisions.  A negative result may occur with improper specimen collection / handling, submission of specimen other than nasopharyngeal swab, presence of viral mutation(s) within the areas targeted by this assay, and inadequate number of viral copies (<250 copies / mL). A negative result must be combined with clinical observations, patient history, and epidemiological information.  Fact Sheet for Patients:   StrictlyIdeas.no  Fact Sheet for Healthcare Providers: BankingDealers.co.za  This test is not yet approved or  cleared by the [place] FDA and has been authorized for detection and/or diagnosis of SARS-CoV-2 by FDA under an Emergency Use Authorization (EUA).  This EUA will remain in effect (meaning this test can be used) for the duration of the COVID-19 declaration under Section 564(b)(1) of the Act, 21 U.S.C. section 360bbb-3(b)(1), unless the authorization is terminated or revoked sooner.  Performed at [facility], [place] [address], [place], [place] [address]   Urine culture     Status: Abnormal    Collection Time: 08/13/2020  6:09 PM   Specimen: In/Out Cath Urine  Result Value Ref Range Status   Specimen Description IN/OUT CATH URINE  Final   Special Requests   Final    NONE Performed at [facility], [place] [address], [place], [place] [address]    Culture (A)  Final    >=100,000 COLONIES/mL KLEBSIELLA PNEUMONIAE >=100,000 COLONIES/mL  ENTEROCOCCUS FAECALIS    Report Status 08/24/2020 FINAL  Final   Organism ID, Bacteria KLEBSIELLA PNEUMONIAE (A)  Final   Organism ID, Bacteria ENTEROCOCCUS FAECALIS (A)  Final      Susceptibility   Enterococcus faecalis - MIC*    AMPICILLIN <=2 SENSITIVE Sensitive     NITROFURANTOIN <=16 SENSITIVE Sensitive     VANCOMYCIN 1 SENSITIVE Sensitive     * >=100,000 COLONIES/mL ENTEROCOCCUS FAECALIS   Klebsiella pneumoniae - MIC*    AMPICILLIN >=32 RESISTANT Resistant     CEFAZOLIN <=4 SENSITIVE Sensitive     CEFEPIME <=0.12 SENSITIVE Sensitive     CEFTRIAXONE <=0.25 SENSITIVE Sensitive     CIPROFLOXACIN <=0.25 SENSITIVE Sensitive     GENTAMICIN <=1 SENSITIVE Sensitive     IMIPENEM <=0.25 SENSITIVE Sensitive     NITROFURANTOIN 64 INTERMEDIATE Intermediate     TRIMETH/SULFA <=20 SENSITIVE Sensitive     AMPICILLIN/SULBACTAM 4 SENSITIVE Sensitive     PIP/TAZO <=4 SENSITIVE Sensitive     * >=100,000 COLONIES/mL KLEBSIELLA PNEUMONIAE  Sputum Culture     Status: None   Collection Time: 08/22/20  2:01 AM   Specimen: Expectorated Sputum  Result Value Ref Range Status   Specimen Description EXPECTORATED SPUTUM  Final   Special Requests NONE  Final   Sputum evaluation   Final    Sputum specimen not acceptable for testing.  Please recollect.   RESULT CALLED TO, READ BACK BY AND VERIFIED WITH: Elige Radon RN 08/22/20 [id number] Performed at [facility], [address], [place], [place] [address]    Report Status 08/22/2020 FINAL  Final  Blood Culture (routine x 2)     Status: None   Collection Time: 08/22/20 10:03 AM   Specimen: BLOOD  Result  Value Ref Range Status   Specimen Description BLOOD LEFT ANTECUBITAL  Final   Special Requests   Final    BOTTLES DRAWN AEROBIC AND ANAEROBIC Blood Culture results may not be optimal due to an excessive volume of blood received in culture bottles   Culture   Final    NO GROWTH 5 DAYS Performed at [facility], [place] [address], [place], [place] [address]    Report Status 08/27/2020 FINAL  Final  MRSA PCR Screening     Status: None   Collection Time: 08/22/20  2:33 PM   Specimen: Nasal Mucosa; Nasopharyngeal  Result Value Ref Range Status   MRSA by PCR NEGATIVE NEGATIVE Final    Comment:        The GeneXpert MRSA Assay (FDA approved for NASAL specimens only), is one component of a comprehensive MRSA colonization surveillance program. It is not intended to diagnose MRSA infection nor to guide or monitor treatment for MRSA infections. Performed at [facility], [place] [address], [place], [place] [address]   SARS CORONAVIRUS 2 (TAT 6-24 HRS) Nasopharyngeal Nasopharyngeal Swab     Status: None   Collection Time: 08/26/20  2:04 PM   Specimen: Nasopharyngeal Swab  Result Value Ref Range Status   SARS Coronavirus 2 NEGATIVE NEGATIVE Final    Comment: (NOTE) SARS-CoV-2 target nucleic acids are NOT DETECTED.  The SARS-CoV-2 RNA is generally detectable in upper and lower respiratory specimens during the acute phase of infection. Negative results do not preclude SARS-CoV-2 infection, do not rule out co-infections with other pathogens, and should not be used as the sole basis for treatment or other patient management decisions. Negative results must be combined with clinical observations, patient history,  and epidemiological information. The expected result is Negative.  Fact Sheet for Patients: SugarRoll.be  Fact Sheet for Healthcare Providers: https://www.woods-mathews.com/  This test is not yet approved or cleared by the [place] FDA and  has been authorized for detection and/or diagnosis of SARS-CoV-2 by FDA under an Emergency Use Authorization (EUA). This EUA will remain  in effect (meaning this test can be used) for the duration of the COVID-19 declaration under Se ction 564(b)(1) of the Act, 21 U.S.C. section 360bbb-3(b)(1), unless the authorization is terminated or revoked sooner.  Performed at [facility], [place] [address] [place], [place] [address]          Radiology Studies: No results found.      Scheduled Meds: . acetaminophen  500 mg Oral Q6H  . acetaminophen  650 mg Oral Once   Continuous Infusions:    LOS: 7 days    Time spent: 28 minutes spent on chart review, discussion with nursing staff, consultants, updating family and interview/physical exam; more than 50% of that time was spent in counseling and/or coordination of care.    Morry Veiga J [place] DO [facility] Available via Epic secure chat 7am-7pm After these hours, please refer to coverage provider listed on [email or web address] 08/28/2020, 12:41 PM

## 2020-08-29 DIAGNOSIS — A419 Sepsis, unspecified organism: Secondary | ICD-10-CM | POA: Diagnosis not present

## 2020-08-29 DIAGNOSIS — E785 Hyperlipidemia, unspecified: Secondary | ICD-10-CM | POA: Diagnosis not present

## 2020-08-29 DIAGNOSIS — N1832 Chronic kidney disease, stage 3b: Secondary | ICD-10-CM | POA: Diagnosis not present

## 2020-08-29 DIAGNOSIS — T68XXXA Hypothermia, initial encounter: Secondary | ICD-10-CM

## 2020-08-29 DIAGNOSIS — N179 Acute kidney failure, unspecified: Secondary | ICD-10-CM | POA: Diagnosis not present

## 2020-08-29 NOTE — Progress Notes (Signed)
[facility] ([facility])   [facility] is unable to offer a room today. Hospital Liaison will follow up tomorrow or sooner if a room becomes available. Please do not hesitate to call with questions.     Jennifer Woody RN, BSN, CCRN [facility] Hospital Liaison  

## 2020-08-29 NOTE — Progress Notes (Signed)
[facility]  PROGRESS NOTE  MYLEENE BRANTLY [id number] DOB: 19-Jun-1926 DOA: 08/11/2020 PCP: Holland Commons, FNP   Brief HPI:   87 year old female with a medical history of CKD stage IIIb, solitary kidney, hyperlipidemia, essential hypertension, peripheral arterial disease, iron deficiency anemia, peptic ulcer disease, AAA, chronic hypoxemic respiratory failure on 5 L/min oxygen via nasal cannula at baseline presented to ED with complaints of shortness of breath and chest pain.  Chest x-ray showed patchy LUL opacity throughout right lung and mid interstitial thickening of the left lung with cardiomegaly concerning for multilobar pneumonia versus asymmetric pulmonary edema.  CT chest without contrast showed moderate right and left small pleural effusion.  During the night of admission she was developing hypotension despite aggressive resuscitation with IV fluids at 30 cc/kg.  PCCM was consulted for possible transfer to ICU for vasopressor initiation.  Patient was made DNR after discussion of critical care doctor,  Dr. Lake Bells with patient's family.  Patient started on empiric antibiotics azithromycin and ceftriaxone which was escalated to Unasyn.  Patient continued to decline so Dr. Eric [place] discussed with patient's daughter and patient was transitioned to comfort measures only.  She is awaiting bed at residential hospice.    Subjective   Patient seen and examined, denies any complaints.   Assessment/Plan:     1. Severe sepsis, present on admission-patient presented with shortness of breath, cough.  Lactic acid was elevated 2.9, procalcitonin 0.15.  Chest x-ray showed multifocal pneumonia.  Urine culture positive for Klebsiella pneumoniae and Enterococcus faecalis.  She was started on empiric antibiotics azithromycin and ceftriaxone which was changed to Unasyn.  Given patient continued decline despite antibiotics Dr. Eric [place] discussed with patient's daughter [name] regarding  poor prognosis.  And it was decided to transition to comfort measures at this time.  Patient started on p.o./sublingual morphine, Ativan as needed, glycopyrrolate as needed.  Awaiting bed at residential hospice. 2. Acute on chronic hypoxemic respiratory failure-patient was found to by EMS with SPO2 in 70s, on 5 L nasal cannula.  Patient is currently transition to comfort measures only. 3. Symptomatic anemia-she has history of iron deficiency anemia, and history peptic ulcer disease.  She presented with hemoglobin of 7.1,.  Baseline hemoglobin 8-9.1.  Follow GI as outpatient.  Had EGD on 07/07/2020 which showed few cratered nonbleeding duodenal ulcers.  Anemia panel with iron 50, TIBC 318, ferritin 91, B12 591, folate 16.1.  She was transfused 2 units PRBC on 08/23/2019.  Hemoglobin remained stable following blood transfusion.  Currently on comfort measures only. 4. Acute kidney injury on CKD stage IIIb-baseline creatinine 1.2-1.4.  Etiology likely due to prerenal azotemia from dehydration.Renal ultrasound with marked atrophy left kidney, increased echogenicity of normal size right kidney consistent with medical renal disease and diffuse bladder wall thickening and irregularity.  Patient's creatinine continued to worsen with increased BUN despite aggressive measures.  Patient now somnolent and transition to comfort measures. 5. Transaminitis-LFTs elevated likely from shock liver from hypotension and severe sepsis as above. 6. Elevated troponin-patient presented with troponin elevated at 372, 3477 ED.  TTE showed LVEF 65 to 70%, no regional wall motion abnormalities.  RV moderately enlarged with severely elevated pulmonary artery pressure.  Patient is now comfort measures only.      COVID-19 Labs  No results for input(s): DDIMER, FERRITIN, LDH, CRP in the last 72 hours.  Lab Results  Component Value Date   SARSCOV2NAA NEGATIVE 08/26/2020   [place] NEGATIVE 08/16/2020   [place] NEGATIVE  07/13/2020  [place] NEGATIVE 07/06/2020     Scheduled medications:   . acetaminophen  500 mg Oral Q6H  . acetaminophen  650 mg Oral Once         CBG: Recent Labs  Lab 08/25/20 0333  GLUCAP 96    SpO2: 90 % O2 Flow Rate (L/min): 6 L/min    CBC: Recent Labs  Lab 08/22/20 2100 08/23/20 0852 08/24/20 0428 08/25/20 0508  WBC  --  17.6* 15.2* 14.8*  HGB 10.6* 10.6* 11.1* 10.4*  HCT 33.3* 32.8* 35.4* 35.7*  MCV  --  91.9 94.7 98.3  PLT  --  339 343 628    Basic Metabolic Panel: Recent Labs  Lab 08/23/20 0214 08/24/20 0428 08/25/20 0508  NA 135 139 140  K 5.9* 5.9* 6.8*  CL 99 100 101  CO2 23 25 20*  GLUCOSE 70 88 97  BUN 56* 70* 85*  CREATININE 2.56* 2.88* 3.33*  CALCIUM 8.5* 9.0 8.7*  MG 1.8 1.9 2.1     Liver Function Tests: Recent Labs  Lab 08/23/20 0214 08/24/20 0428 08/25/20 0508  AST 156* 148* 277*  ALT 182* 202* 278*  ALKPHOS 164* 177* 156*  BILITOT 0.6 0.5 0.7  PROT 5.5* 6.0* 5.6*  ALBUMIN 2.5* 2.5* 2.4*     Antibiotics: Anti-infectives (From admission, onward)   Start     Dose/Rate Route Frequency Ordered Stop   08/24/20 1300  ampicillin-sulbactam (UNASYN) 1.5 g in sodium chloride 0.9 % 100 mL IVPB  Status:  Discontinued        1.5 g 200 mL/hr over 30 Minutes Intravenous Every 24 hours 08/24/20 1214 08/25/20 0851   08/23/20 1700  vancomycin (VANCOREADY) IVPB 500 mg/100 mL  Status:  Discontinued        500 mg 100 mL/hr over 60 Minutes Intravenous Every 48 hours 08/07/2020 1626 08/23/20 1117   08/29/2020 1900  cefTRIAXone (ROCEPHIN) 2 g in sodium chloride 0.9 % 100 mL IVPB  Status:  Discontinued        2 g 200 mL/hr over 30 Minutes Intravenous Every 24 hours 08/13/2020 1855 08/24/20 1214   08/15/2020 1900  azithromycin (ZITHROMAX) tablet 500 mg  Status:  Discontinued        500 mg Oral Daily 08/19/2020 1855 08/24/20 1214   08/03/2020 1515  ceFEPIme (MAXIPIME) 2 g in sodium chloride 0.9 % 100 mL IVPB        2 g 200 mL/hr over 30 Minutes  Intravenous  Once 08/16/2020 1505 08/29/2020 1630   08/18/2020 1515  vancomycin (VANCOCIN) IVPB 1000 mg/200 mL premix        1,000 mg 200 mL/hr over 60 Minutes Intravenous  Once 08/02/2020 1509 08/02/2020 1630       DVT prophylaxis: None, patient is comfort care  Code Status: DNR  Family Communication: No family at bedside   Consultants:    Procedures:      Objective   Vitals:   08/28/20 0440 08/28/20 1218 08/28/20 1956 08/29/20 1252  BP: 131/86 (!) 129/95 116/79 (!) 153/99  Pulse: (!) 57 (!) 154 (!) 127 (!) 126  Resp: 18 (!) 24 16 17   Temp: 98.9 F (37.2 C) 99.2 F (37.3 C) 97.8 F (36.6 C) 97.8 F (36.6 C)  TempSrc: Oral Oral Oral Axillary  SpO2: 93% (!) 87% 93% 90%  Weight:      Height:        Intake/Output Summary (Last 24 hours) at 08/29/2020 1816 Last data filed at 08/29/2020 0500 Gross per 24 hour  Intake 0 ml  Output 300 ml  Net -300 ml    01/27 1901 - 01/29 0700 In: 0  Out: 2542 [[id number]]  Filed Weights   08/23/20 1429  Weight: 42 kg    Physical Examination:   General-appears in no acute distress Heart-S1-S2, regular, no murmur auscultated Lungs-clear to auscultation bilaterally, no wheezing or crackles auscultated Abdomen-soft, nontender, no organomegaly Extremities-no edema in the lower extremities Neuro-alert, oriented x3, no focal deficit noted  Status is: Inpatient  Dispo: The patient is from: Home              Anticipated d/c is to: [employer]              Anticipated d/c date is: 09/02/2020              Patient currently stable for discharge  Barrier to discharge-awaiting bed at residential hospice  Pressure Injury 07/06/20 Ischial tuberosity Right Deep Tissue Pressure Injury - Purple or maroon localized area of discolored intact skin or blood-filled blister due to damage of underlying soft tissue from pressure and/or shear. (Active)  07/06/20 2350  Location: Ischial tuberosity  Location Orientation: Right  Staging: Deep Tissue  Pressure Injury - Purple or maroon localized area of discolored intact skin or blood-filled blister due to damage of underlying soft tissue from pressure and/or shear.  Wound Description (Comments):   Present on Admission: Yes     Pressure Injury 08/22/20 Sacrum Posterior;Lower Stage 3 -  Full thickness tissue loss. Subcutaneous fat may be visible but bone, tendon or muscle are NOT exposed. (Active)  08/22/20 1500  Location: Sacrum  Location Orientation: Posterior;Lower  Staging: Stage 3 -  Full thickness tissue loss. Subcutaneous fat may be visible but bone, tendon or muscle are NOT exposed.  Wound Description (Comments):   Present on Admission: Yes           Data Reviewed:   Recent Results (from the past 240 hour(s))  Blood Culture (routine x 2)     Status: None   Collection Time: 23-Aug-2020  3:11 PM   Specimen: BLOOD  Result Value Ref Range Status   Specimen Description BLOOD BLOOD LEFT FOREARM  Final   Special Requests   Final    BOTTLES DRAWN AEROBIC AND ANAEROBIC Blood Culture adequate volume   Culture   Final    NO GROWTH 5 DAYS Performed at [facility], [address] [place], [place] [address]    Report Status 08/26/2020 FINAL  Final  SARS Coronavirus 2 by RT PCR (hospital order, performed in [facility] hospital lab) Nasopharyngeal Nasopharyngeal Swab     Status: None   Collection Time: 23-Aug-2020  3:11 PM   Specimen: Nasopharyngeal Swab  Result Value Ref Range Status   SARS Coronavirus 2 NEGATIVE NEGATIVE Final    Comment: (NOTE) SARS-CoV-2 target nucleic acids are NOT DETECTED.  The SARS-CoV-2 RNA is generally detectable in upper and lower respiratory specimens during the acute phase of infection. The lowest concentration of SARS-CoV-2 viral copies this assay can detect is 250 copies / mL. A negative result does not preclude SARS-CoV-2 infection and should not be used as the sole basis for treatment or other patient management decisions.  A negative  result may occur with improper specimen collection / handling, submission of specimen other than nasopharyngeal swab, presence of viral mutation(s) within the areas targeted by this assay, and inadequate number of viral copies (<250 copies / mL). A negative result must be combined with  clinical observations, patient history, and epidemiological information.  Fact Sheet for Patients:   StrictlyIdeas.no  Fact Sheet for Healthcare Providers: BankingDealers.co.za  This test is not yet approved or  cleared by the [place] FDA and has been authorized for detection and/or diagnosis of SARS-CoV-2 by FDA under an Emergency Use Authorization (EUA).  This EUA will remain in effect (meaning this test can be used) for the duration of the COVID-19 declaration under Section 564(b)(1) of the Act, 21 U.S.C. section 360bbb-3(b)(1), unless the authorization is terminated or revoked sooner.  Performed at [facility], [place] [address], [place], [place] [address]   Urine culture     Status: Abnormal   Collection Time: 08/09/2020  6:09 PM   Specimen: In/Out Cath Urine  Result Value Ref Range Status   Specimen Description IN/OUT CATH URINE  Final   Special Requests   Final    NONE Performed at [facility], [place] [address] [place], [place] [address]    Culture (A)  Final    >=100,000 COLONIES/mL KLEBSIELLA PNEUMONIAE >=100,000 COLONIES/mL ENTEROCOCCUS FAECALIS    Report Status 08/24/2020 FINAL  Final   Organism ID, Bacteria KLEBSIELLA PNEUMONIAE (A)  Final   Organism ID, Bacteria ENTEROCOCCUS FAECALIS (A)  Final      Susceptibility   Enterococcus faecalis - MIC*    AMPICILLIN <=2 SENSITIVE Sensitive     NITROFURANTOIN <=16 SENSITIVE Sensitive     VANCOMYCIN 1 SENSITIVE Sensitive     * >=100,000 COLONIES/mL ENTEROCOCCUS FAECALIS   Klebsiella pneumoniae - MIC*    AMPICILLIN >=32 RESISTANT Resistant     CEFAZOLIN <=4 SENSITIVE Sensitive      CEFEPIME <=0.12 SENSITIVE Sensitive     CEFTRIAXONE <=0.25 SENSITIVE Sensitive     CIPROFLOXACIN <=0.25 SENSITIVE Sensitive     GENTAMICIN <=1 SENSITIVE Sensitive     IMIPENEM <=0.25 SENSITIVE Sensitive     NITROFURANTOIN 64 INTERMEDIATE Intermediate     TRIMETH/SULFA <=20 SENSITIVE Sensitive     AMPICILLIN/SULBACTAM 4 SENSITIVE Sensitive     PIP/TAZO <=4 SENSITIVE Sensitive     * >=100,000 COLONIES/mL KLEBSIELLA PNEUMONIAE  Sputum Culture     Status: None   Collection Time: 08/22/20  2:01 AM   Specimen: Expectorated Sputum  Result Value Ref Range Status   Specimen Description EXPECTORATED SPUTUM  Final   Special Requests NONE  Final   Sputum evaluation   Final    Sputum specimen not acceptable for testing.  Please recollect.   RESULT CALLED TO, READ BACK BY AND VERIFIED WITH: Elige Radon RN 08/22/20 [id number] Performed at [facility], [address], [place], [place] [address]    Report Status 08/22/2020 FINAL  Final  Blood Culture (routine x 2)     Status: None   Collection Time: 08/22/20 10:03 AM   Specimen: BLOOD  Result Value Ref Range Status   Specimen Description BLOOD LEFT ANTECUBITAL  Final   Special Requests   Final    BOTTLES DRAWN AEROBIC AND ANAEROBIC Blood Culture results may not be optimal due to an excessive volume of blood received in culture bottles   Culture   Final    NO GROWTH 5 DAYS Performed at [facility], [place] [address] [place], [place] [address]    Report Status 08/27/2020 FINAL  Final  MRSA PCR Screening     Status: None   Collection Time: 08/22/20  2:33 PM   Specimen: Nasal Mucosa; Nasopharyngeal  Result Value Ref Range  Status   MRSA by PCR NEGATIVE NEGATIVE Final    Comment:        The GeneXpert MRSA Assay (FDA approved for NASAL specimens only), is one component of a comprehensive MRSA colonization surveillance program. It is not intended to diagnose MRSA infection nor to guide or monitor treatment for MRSA  infections. Performed at [facility], [place] [address], [place], [place] [address]   SARS CORONAVIRUS 2 (TAT 6-24 HRS) Nasopharyngeal Nasopharyngeal Swab     Status: None   Collection Time: 08/26/20  2:04 PM   Specimen: Nasopharyngeal Swab  Result Value Ref Range Status   SARS Coronavirus 2 NEGATIVE NEGATIVE Final    Comment: (NOTE) SARS-CoV-2 target nucleic acids are NOT DETECTED.  The SARS-CoV-2 RNA is generally detectable in upper and lower respiratory specimens during the acute phase of infection. Negative results do not preclude SARS-CoV-2 infection, do not rule out co-infections with other pathogens, and should not be used as the sole basis for treatment or other patient management decisions. Negative results must be combined with clinical observations, patient history, and epidemiological information. The expected result is Negative.  Fact Sheet for Patients: SugarRoll.be  Fact Sheet for Healthcare Providers: https://www.woods-mathews.com/  This test is not yet approved or cleared by the [place] FDA and  has been authorized for detection and/or diagnosis of SARS-CoV-2 by FDA under an Emergency Use Authorization (EUA). This EUA will remain  in effect (meaning this test can be used) for the duration of the COVID-19 declaration under Se ction 564(b)(1) of the Act, 21 U.S.C. section 360bbb-3(b)(1), unless the authorization is terminated or revoked sooner.  Performed at [facility], [place] [address], [place], [place] [address]     No results for input(s): LIPASE, AMYLASE in the last 168 hours. No results for input(s): AMMONIA in the last 168 hours.  Cardiac Enzymes: No results for input(s): CKTOTAL, CKMB, CKMBINDEX, TROPONINI in the last 168 hours. BNP (last 3 results) Recent Labs    05/05/20 0535 07/10/20 0319 08/16/2020 1500  BNP 106.6* 1,474.2* 752.6*    ProBNP (last 3 results) No results for input(s):  PROBNP in the last 8760 hours.  Studies:  No results found.     Oswald Hillock   [facility] If 7PM-7AM, please contact night-coverage at [email or web address], Office  [phone]   08/29/2020, 6:16 PM  LOS: 8 days

## 2020-08-30 DIAGNOSIS — A419 Sepsis, unspecified organism: Secondary | ICD-10-CM | POA: Diagnosis not present

## 2020-08-30 DIAGNOSIS — Z66 Do not resuscitate: Secondary | ICD-10-CM | POA: Diagnosis not present

## 2020-08-30 DIAGNOSIS — J189 Pneumonia, unspecified organism: Secondary | ICD-10-CM | POA: Diagnosis not present

## 2020-08-30 DIAGNOSIS — R6520 Severe sepsis without septic shock: Secondary | ICD-10-CM | POA: Diagnosis not present

## 2020-09-01 NOTE — Progress Notes (Signed)
Dr Darrick Meigs has been notified of time of death 2020-09-27 at 1219-11-30

## 2020-09-01 NOTE — Progress Notes (Signed)
Comfort care continues.  Oxygen saturation readings 87% with 4 liters of oxygen for comfort.  Respirations are labored.  The patient is alone at this time.

## 2020-09-01 NOTE — Progress Notes (Signed)
The patient's daughter is here and belongings gathered.

## 2020-09-01 NOTE — Progress Notes (Signed)
Patient continues to decline.  Comfort measures are in place.  Oxygen is in place for comfort.  The patient does not respond when touched or spoken to.  Mouth is open with respirations labored.  Oxygen saturation reading report 87% with heart rate of 140-150  Staff will continue to provide comfort as much as possible.  No family members are present at this time.

## 2020-09-01 NOTE — Discharge Summary (Addendum)
Death Summary  Kristina [name] CWC:[id number] DOB: 04-Aug-1925 DOA: Sep 13, 2020  PCP: Holland Commons, FNP   Admit date: 09-13-20 Date of Death: 22-Sep-2020  Final Diagnoses:   Principal problem Severe sepsis Multifocal pneumonia Acute kidney injury Acute on chronic hypoxemic respiratory failure End-of-life care DNR  Active Problems:   Atherosclerotic PVD with intermittent claudication - near occlusive SFA disease with moderate iliac disease   Essential hypertension   Hyperlipidemia with target low density lipoprotein (LDL) cholesterol less than 70 mg/dL   Chronic kidney disease (CKD), stage III (moderate) (HCC)   Protein-calorie malnutrition ([place])   Lobar pneumonia ([place])   Sepsis ([place])   CAP (community acquired pneumonia)   Septic shock ([place])      Pressure injury of skin       History of present illness:  87 year old female with a medical history of CKD stage IIIb, solitary kidney, hyperlipidemia, essential hypertension, peripheral arterial disease, iron deficiency anemia, peptic ulcer disease, AAA, chronic hypoxemic respiratory failure on 5 L/min oxygen via nasal cannula at baseline presented to ED with complaints of shortness of breath and chest pain.  Chest x-ray showed patchy LUL opacity throughout right lung and mid interstitial thickening of the left lung with cardiomegaly concerning for multilobar pneumonia versus asymmetric pulmonary edema.  CT chest without contrast showed moderate right and left small pleural effusion.  During the night of admission she was developing hypotension despite aggressive resuscitation with IV fluids at 30 cc/kg.  PCCM was consulted for possible transfer to ICU for vasopressor initiation.  Patient was made DNR after discussion of critical care doctor,  Dr. Lake Bells with patient's family.  Patient started on empiric antibiotics azithromycin and ceftriaxone which was escalated to Unasyn.  Patient continued to decline so Dr. Eric [place]  discussed with patient's daughter and patient was transitioned to comfort measures only.  She is awaiting bed at residential hospice.   Hospital Course:  1. Severe sepsis, present on admission-patient presented with shortness of breath, cough.  Lactic acid was elevated 2.9, procalcitonin 0.15.  Chest x-ray showed multifocal pneumonia.  Urine culture positive for Klebsiella pneumoniae and Enterococcus faecalis.  She was started on empiric antibiotics azithromycin and ceftriaxone which was changed to Unasyn.  Given patient continued decline despite antibiotics Dr. Eric [place] discussed with patient's daughter [name] regarding poor prognosis.  And it was decided to transition to comfort measures at this time.  Patient started on p.o./sublingual morphine, Ativan as needed, glycopyrrolate as needed.   2. Acute on chronic hypoxemic respiratory failure-patient was found to by EMS with SPO2 in 70s, on 5 L nasal cannula.  3. Symptomatic anemia-she has history of iron deficiency anemia, and history peptic ulcer disease.  She presented with hemoglobin of 7.1,.  Baseline hemoglobin 8-9.1.  Follow GI as outpatient.  Had EGD on 07/07/2020 which showed few cratered nonbleeding duodenal ulcers.  Anemia panel with iron 50, TIBC 318, ferritin 91, B12 591, folate 16.1.  She was transfused 2 units PRBC on 08/23/2019.  Hemoglobin remained stable following blood transfusion.   4. Acute kidney injury on CKD stage IIIb-baseline creatinine 1.2-1.4.  Etiology likely due to prerenal azotemia from dehydration.Renal ultrasound with marked atrophy left kidney, increased echogenicity of normal size right kidney consistent with medical renal disease and diffuse bladder wall thickening and irregularity. Patient's creatinine continued to worsen with increased BUN despite aggressive measures.  5. Transaminitis-LFTs elevated likely from shock liver from hypotension and severe sepsis as above. 6. Elevated troponin-patient presented with  troponin elevated at  372, [place] ED.  TTE showed LVEF 65 to 70%, no regional wall motion abnormalities.  RV moderately enlarged with severely elevated pulmonary artery pressure.   7. Patient was transitioned to comfort measures only on 08/28/2020.  Patient expired at 12:21 PM.     Time: Of death-12:21 PM  Signed:  Oswald Hillock  [facility] 09/24/2020, 12:33 PM

## 2020-09-01 NOTE — Progress Notes (Signed)
I have attempted to call the patient's granddaughter "lisa"- did not answer the phone.

## 2020-09-01 NOTE — Progress Notes (Signed)
Donor services contacted- no referral.  Awaiting for family member to arrive.

## 2020-09-01 NOTE — Plan of Care (Signed)
  Problem: Pain Managment: Goal: General experience of comfort will improve Outcome: Progressing   Problem: Safety: Goal: Ability to remain free from injury will improve Outcome: Progressing   Problem: Skin Integrity: Goal: Risk for impaired skin integrity will decrease Outcome: Progressing   

## 2020-09-01 NOTE — Plan of Care (Signed)

## 2020-09-01 NOTE — Progress Notes (Signed)
[facility] ([facility])   [facility] is unable to offer a room today. Hospital Liaison will follow up tomorrow or sooner if a room becomes available. Please do not hesitate to call with any questions.   Thank you, Melissa O'Bryant, BSN, RN Hospital Liaison [phone] 

## 2020-09-01 DEATH — deceased

## 2020-11-29 NOTE — Hospital Course (Addendum)
H&P is copied almost in entirety from ED PA's note  A. fib RVR documented on EKG 1/25 but copy-paste-forwarded note = on the 26th,27th, 28th denotes NSR  "Elevated troponin likely secondary to type II demand ischemia Patient presenting with chest pain in the setting of hypoxia as her oxygen was malfunctioning at her SNF as well as anemia.  Troponin elevated at 372>477 on ED arrival.  With NSR, rate 80, QTc 439, no concerning ST elevation/depression or T wave inversions.  TTE with LVEF 65-70%, LV no regional wall motion normalities, RV moderately enlarged with severely elevated pulmonary artery pressure, LA severely dilated, RA severely dilated, IVC dilated."

## 2021-05-18 IMAGING — CT CT HEAD W/O CM
3 series · 17 of 47 positions shown, 20 images · non-contrast
Comparison: 12/09/2016

CLINICAL DATA: Fall with minor head trauma

EXAM:
CT HEAD WITHOUT CONTRAST
TECHNIQUE: Contiguous axial images were obtained from the base of the skull
through the vertex without intravenous contrast.

[Series 2: head wo · axial · 0.42mm/px · z∈[+866,+992]mm · 11 of 31 slices shown, 14 images]
[im 3/31  brain]
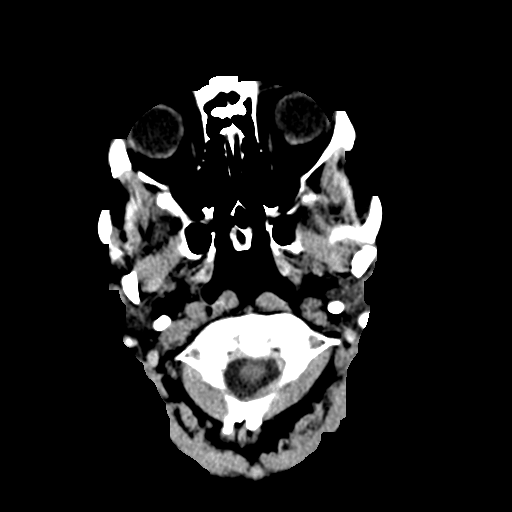
[im 3/31  bone]
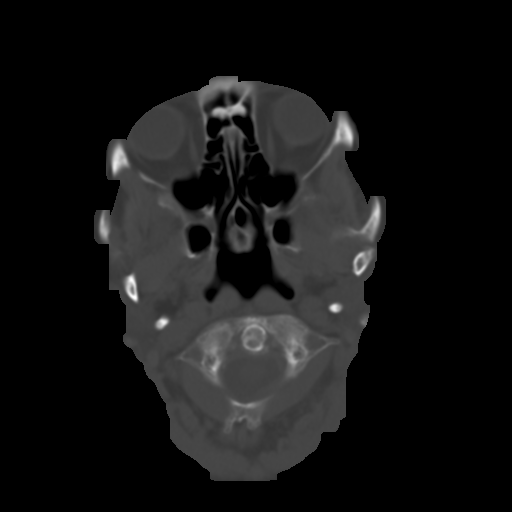
[im 5/31  brain]
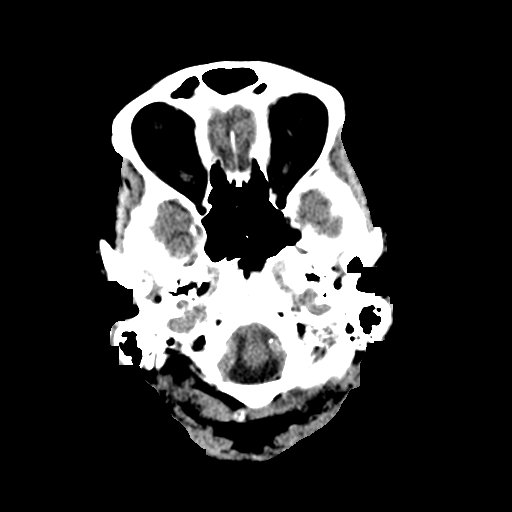
[im 8/31  brain]
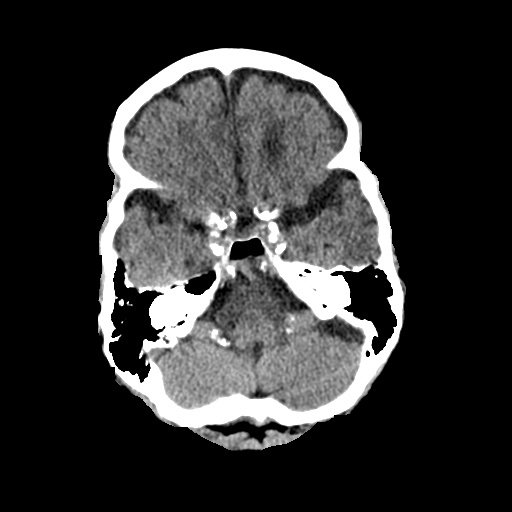
[im 10/31  brain]
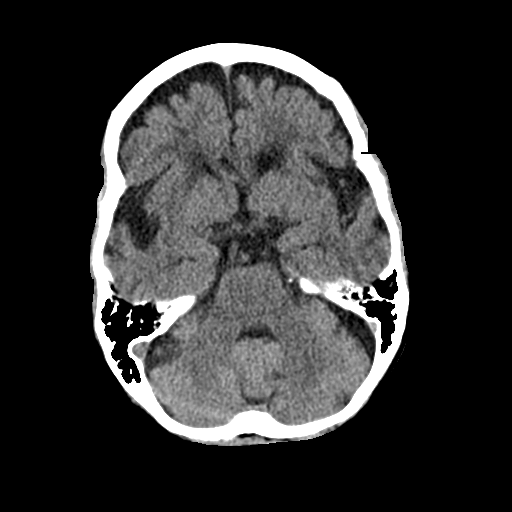
[im 13/31  brain]
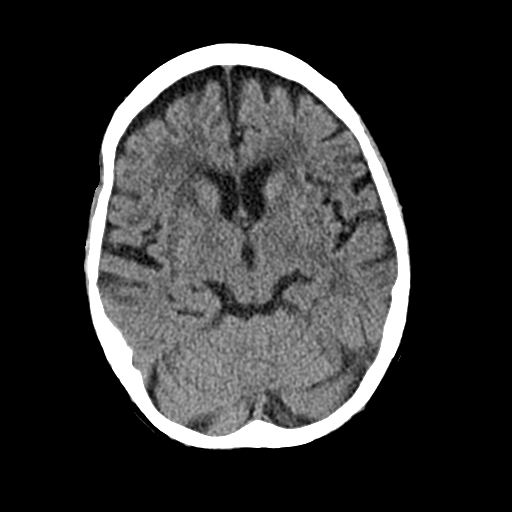
[im 13/31  bone]
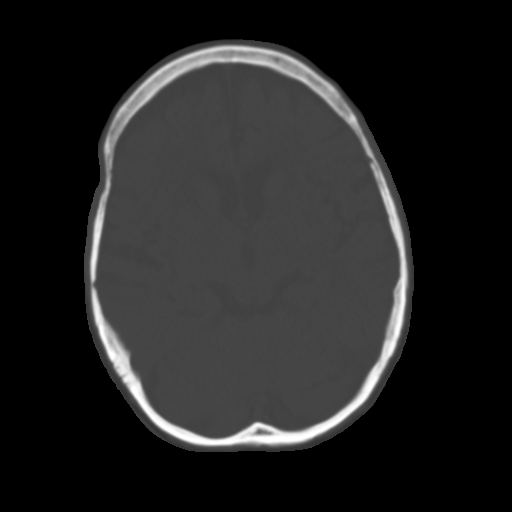
[im 16/31  brain]
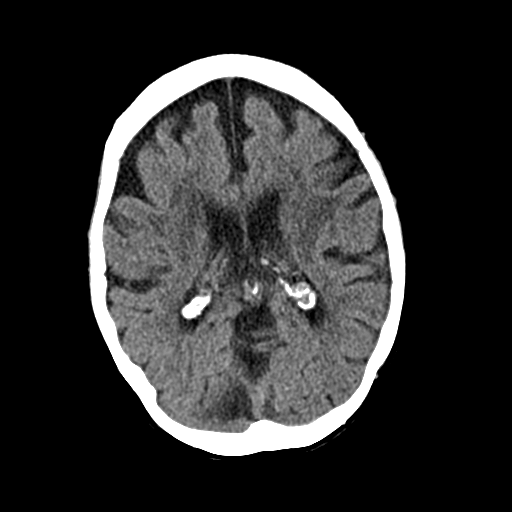
[im 18/31  brain]
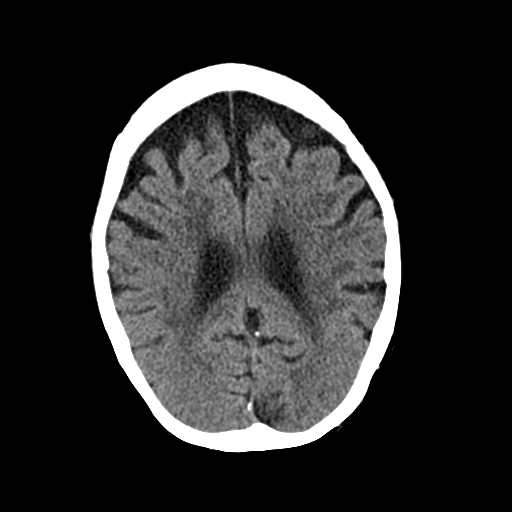
[im 21/31  brain]
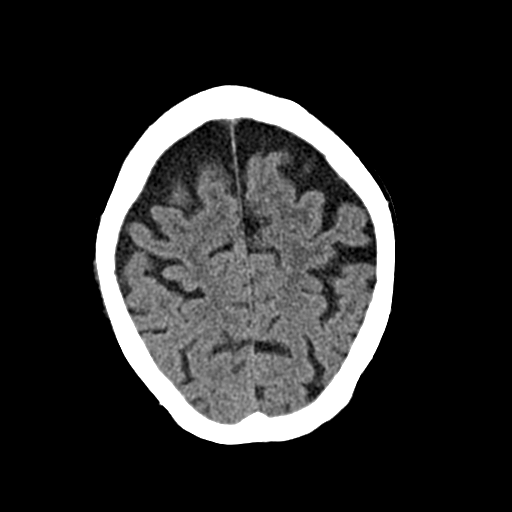
[im 23/31  brain]
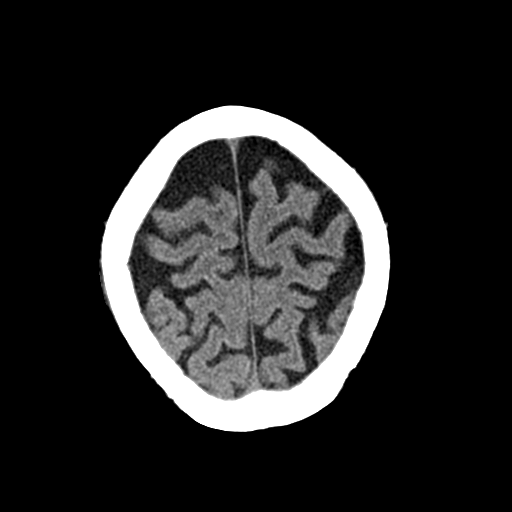
[im 23/31  bone]
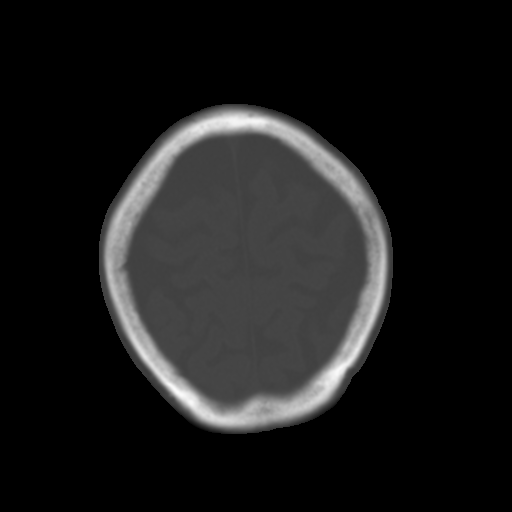
[im 26/31  brain]
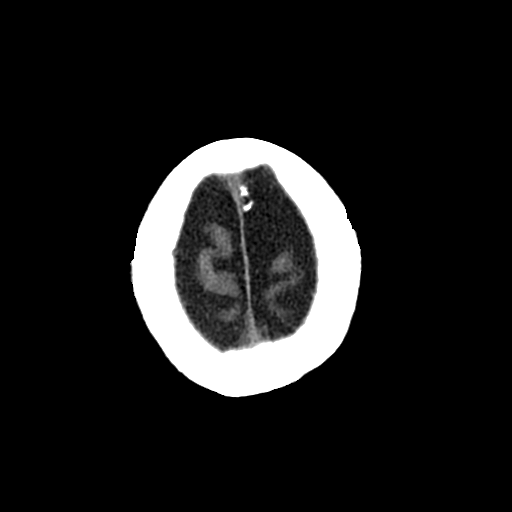
[im 28/31  brain]
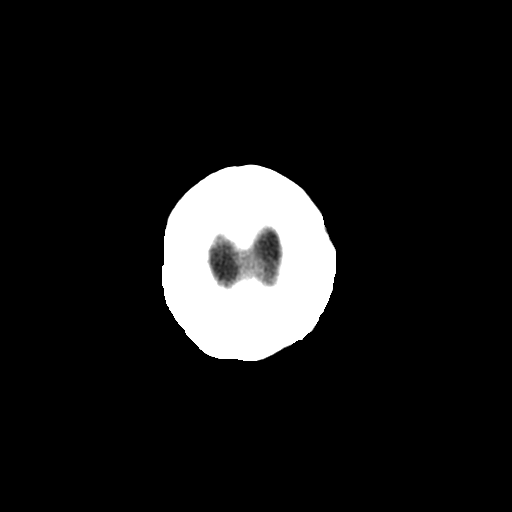

[Series 4: cor soft · coronal · 0.34mm/px · 3 of 60 slices shown]
[im 20/60  brain]
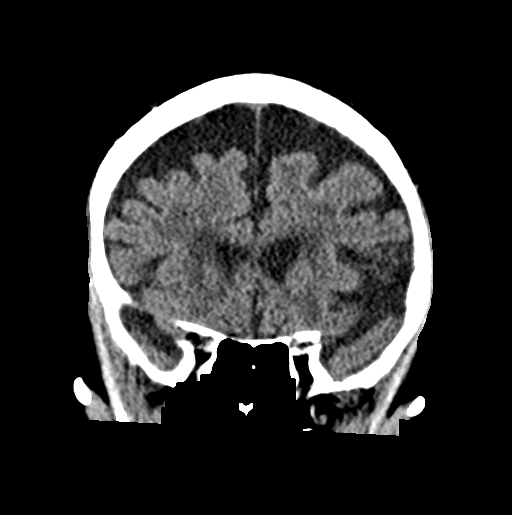
[im 27/60  brain]
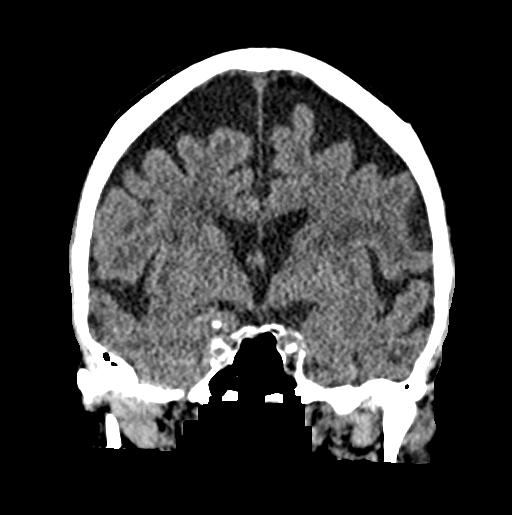
[im 33/60  brain]
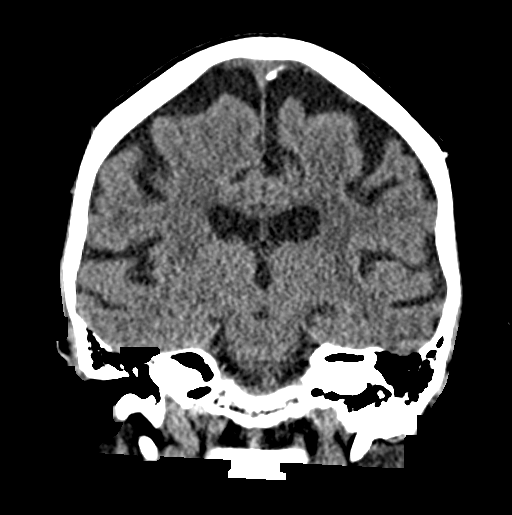

[Series 5: sag soft · sagittal · 0.37mm/px · 3 of 46 slices shown]
[im 16/46  brain]
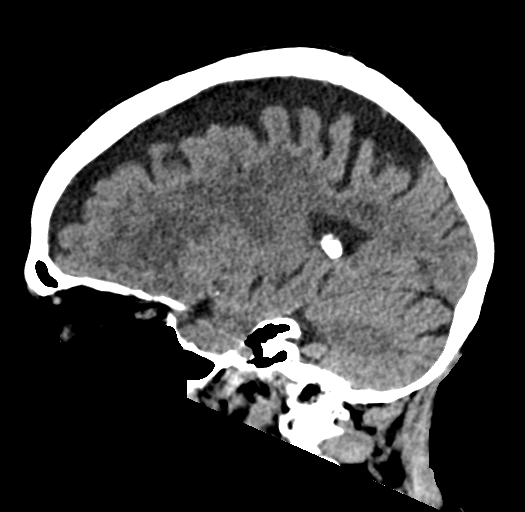
[im 23/46  brain]
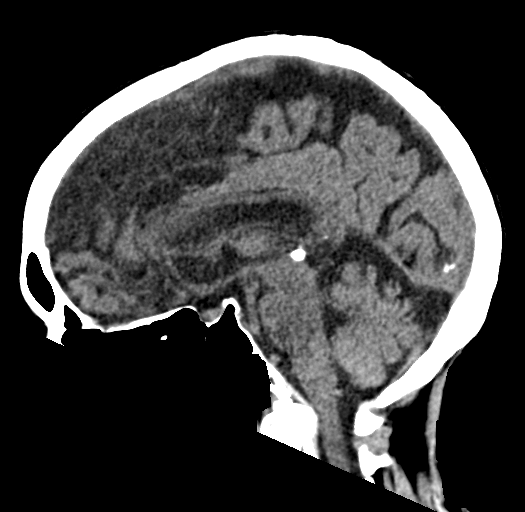
[im 31/46  brain]
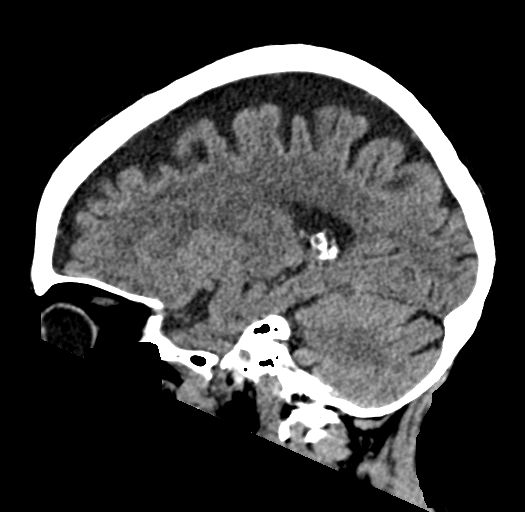

[17 of 47 positions shown; findings below may reference images not displayed]

FINDINGS: Brain: No evidence of acute infarction, hemorrhage, hydrocephalus,
extra-axial collection or mass lesion/mass effect. Chronic small
vessel ischemia which is extensive in the cerebral white matter.
Cerebral volume loss without detected progression.

Vascular: No hyperdense vessel or unexpected calcification.

Skull: High right-sided scalp swelling without calvarial fracture.

Sinuses/Orbits: No evidence of injury.  Bilateral cataract resection
IMPRESSION: 1. No evidence of intracranial injury.
2. Right-sided scalp swelling without calvarial fracture.
